# Patient Record
Sex: Female | Born: 1960
Health system: Southern US, Community
[De-identification: ages and names within clinical notes are randomized; demographics above are authoritative.]

## PROBLEM LIST (undated history)

## (undated) DIAGNOSIS — I10 Essential (primary) hypertension: Secondary | ICD-10-CM

## (undated) DIAGNOSIS — M1990 Unspecified osteoarthritis, unspecified site: Secondary | ICD-10-CM

## (undated) DIAGNOSIS — M8580 Other specified disorders of bone density and structure, unspecified site: Secondary | ICD-10-CM

## (undated) DIAGNOSIS — C801 Malignant (primary) neoplasm, unspecified: Secondary | ICD-10-CM

## (undated) DIAGNOSIS — I499 Cardiac arrhythmia, unspecified: Secondary | ICD-10-CM

## (undated) DIAGNOSIS — R011 Cardiac murmur, unspecified: Secondary | ICD-10-CM

## (undated) DIAGNOSIS — Z8489 Family history of other specified conditions: Secondary | ICD-10-CM

## (undated) DIAGNOSIS — Z972 Presence of dental prosthetic device (complete) (partial): Secondary | ICD-10-CM

## (undated) DIAGNOSIS — Z973 Presence of spectacles and contact lenses: Secondary | ICD-10-CM

## (undated) HISTORY — PX: BREAST SURGERY: SHX581

## (undated) HISTORY — PX: OTHER SURGICAL HISTORY: SHX169

## (undated) HISTORY — PX: BREAST BIOPSY: SHX20

## (undated) HISTORY — PX: SHOULDER SURGERY: SHX246

## (undated) HISTORY — PX: ABDOMINAL HYSTERECTOMY: SHX81

## (undated) HISTORY — PX: DIAGNOSTIC LAPAROSCOPY: SUR761

## (undated) HISTORY — PX: COLON SURGERY: SHX602

---

## 1983-05-23 HISTORY — PX: FOOT SURGERY: SHX648

## 1997-09-24 ENCOUNTER — Encounter: Admission: RE | Admit: 1997-09-24 | Discharge: 1997-09-24 | Payer: Self-pay | Admitting: Obstetrics

## 1997-12-18 ENCOUNTER — Emergency Department (HOSPITAL_COMMUNITY): Admission: EM | Admit: 1997-12-18 | Discharge: 1997-12-18 | Payer: Self-pay | Admitting: Emergency Medicine

## 1997-12-28 ENCOUNTER — Encounter: Admission: RE | Admit: 1997-12-28 | Discharge: 1997-12-28 | Payer: Self-pay | Admitting: Obstetrics & Gynecology

## 1998-04-21 ENCOUNTER — Other Ambulatory Visit: Admission: RE | Admit: 1998-04-21 | Discharge: 1998-04-21 | Payer: Self-pay | Admitting: *Deleted

## 1998-04-30 ENCOUNTER — Encounter: Payer: Self-pay | Admitting: *Deleted

## 1998-05-04 ENCOUNTER — Inpatient Hospital Stay (HOSPITAL_COMMUNITY): Admission: RE | Admit: 1998-05-04 | Discharge: 1998-05-06 | Payer: Self-pay | Admitting: *Deleted

## 1998-09-23 ENCOUNTER — Emergency Department (HOSPITAL_COMMUNITY): Admission: EM | Admit: 1998-09-23 | Discharge: 1998-09-23 | Payer: Self-pay | Admitting: Emergency Medicine

## 1999-01-19 ENCOUNTER — Inpatient Hospital Stay (HOSPITAL_COMMUNITY): Admission: AD | Admit: 1999-01-19 | Discharge: 1999-01-19 | Payer: Self-pay | Admitting: Obstetrics & Gynecology

## 1999-01-25 ENCOUNTER — Emergency Department (HOSPITAL_COMMUNITY): Admission: EM | Admit: 1999-01-25 | Discharge: 1999-01-25 | Payer: Self-pay | Admitting: Emergency Medicine

## 1999-07-25 ENCOUNTER — Other Ambulatory Visit: Admission: RE | Admit: 1999-07-25 | Discharge: 1999-07-25 | Payer: Self-pay | Admitting: *Deleted

## 2000-04-09 ENCOUNTER — Encounter: Payer: Self-pay | Admitting: *Deleted

## 2000-04-09 ENCOUNTER — Encounter: Admission: RE | Admit: 2000-04-09 | Discharge: 2000-04-09 | Payer: Self-pay | Admitting: *Deleted

## 2000-07-22 ENCOUNTER — Inpatient Hospital Stay (HOSPITAL_COMMUNITY): Admission: AD | Admit: 2000-07-22 | Discharge: 2000-07-22 | Payer: Self-pay | Admitting: Obstetrics and Gynecology

## 2000-07-22 ENCOUNTER — Encounter: Payer: Self-pay | Admitting: Obstetrics and Gynecology

## 2000-07-23 ENCOUNTER — Ambulatory Visit (HOSPITAL_COMMUNITY): Admission: RE | Admit: 2000-07-23 | Discharge: 2000-07-23 | Payer: Self-pay | Admitting: Surgery

## 2000-07-23 ENCOUNTER — Encounter: Payer: Self-pay | Admitting: Surgery

## 2000-09-11 ENCOUNTER — Other Ambulatory Visit: Admission: RE | Admit: 2000-09-11 | Discharge: 2000-09-11 | Payer: Self-pay | Admitting: *Deleted

## 2000-09-19 ENCOUNTER — Encounter: Admission: RE | Admit: 2000-09-19 | Discharge: 2000-09-19 | Payer: Self-pay | Admitting: *Deleted

## 2000-09-19 ENCOUNTER — Encounter: Payer: Self-pay | Admitting: *Deleted

## 2000-10-01 ENCOUNTER — Ambulatory Visit (HOSPITAL_BASED_OUTPATIENT_CLINIC_OR_DEPARTMENT_OTHER): Admission: RE | Admit: 2000-10-01 | Discharge: 2000-10-01 | Payer: Self-pay | Admitting: General Surgery

## 2000-10-01 ENCOUNTER — Encounter (INDEPENDENT_AMBULATORY_CARE_PROVIDER_SITE_OTHER): Payer: Self-pay | Admitting: Specialist

## 2002-02-26 ENCOUNTER — Emergency Department (HOSPITAL_COMMUNITY): Admission: EM | Admit: 2002-02-26 | Discharge: 2002-02-26 | Payer: Self-pay | Admitting: Emergency Medicine

## 2002-08-13 ENCOUNTER — Emergency Department (HOSPITAL_COMMUNITY): Admission: EM | Admit: 2002-08-13 | Discharge: 2002-08-13 | Payer: Self-pay | Admitting: Emergency Medicine

## 2002-08-19 ENCOUNTER — Encounter: Admission: RE | Admit: 2002-08-19 | Discharge: 2002-08-19 | Payer: Self-pay | Admitting: Internal Medicine

## 2002-09-10 ENCOUNTER — Encounter: Admission: RE | Admit: 2002-09-10 | Discharge: 2002-09-10 | Payer: Self-pay | Admitting: Internal Medicine

## 2002-10-20 ENCOUNTER — Encounter: Admission: RE | Admit: 2002-10-20 | Discharge: 2002-10-20 | Payer: Self-pay | Admitting: Internal Medicine

## 2002-12-29 ENCOUNTER — Encounter: Admission: RE | Admit: 2002-12-29 | Discharge: 2002-12-29 | Payer: Self-pay | Admitting: Infectious Diseases

## 2003-02-02 ENCOUNTER — Encounter: Admission: RE | Admit: 2003-02-02 | Discharge: 2003-02-02 | Payer: Self-pay | Admitting: Internal Medicine

## 2003-03-20 ENCOUNTER — Encounter: Admission: RE | Admit: 2003-03-20 | Discharge: 2003-03-20 | Payer: Self-pay | Admitting: Internal Medicine

## 2003-08-11 ENCOUNTER — Emergency Department (HOSPITAL_COMMUNITY): Admission: EM | Admit: 2003-08-11 | Discharge: 2003-08-11 | Payer: Self-pay | Admitting: Emergency Medicine

## 2003-10-05 ENCOUNTER — Emergency Department (HOSPITAL_COMMUNITY): Admission: EM | Admit: 2003-10-05 | Discharge: 2003-10-05 | Payer: Self-pay | Admitting: Emergency Medicine

## 2003-12-02 ENCOUNTER — Encounter: Admission: RE | Admit: 2003-12-02 | Discharge: 2003-12-02 | Payer: Self-pay | Admitting: Internal Medicine

## 2003-12-02 ENCOUNTER — Ambulatory Visit (HOSPITAL_COMMUNITY): Admission: RE | Admit: 2003-12-02 | Discharge: 2003-12-02 | Payer: Self-pay | Admitting: Internal Medicine

## 2003-12-04 ENCOUNTER — Encounter: Admission: RE | Admit: 2003-12-04 | Discharge: 2003-12-04 | Payer: Self-pay | Admitting: Internal Medicine

## 2003-12-24 ENCOUNTER — Ambulatory Visit (HOSPITAL_COMMUNITY): Admission: RE | Admit: 2003-12-24 | Discharge: 2003-12-24 | Payer: Self-pay | Admitting: Internal Medicine

## 2003-12-24 ENCOUNTER — Encounter (INDEPENDENT_AMBULATORY_CARE_PROVIDER_SITE_OTHER): Payer: Self-pay | Admitting: *Deleted

## 2004-01-31 ENCOUNTER — Emergency Department (HOSPITAL_COMMUNITY): Admission: EM | Admit: 2004-01-31 | Discharge: 2004-02-01 | Payer: Self-pay | Admitting: Emergency Medicine

## 2004-09-22 ENCOUNTER — Ambulatory Visit: Payer: Self-pay | Admitting: Internal Medicine

## 2004-12-12 ENCOUNTER — Emergency Department (HOSPITAL_COMMUNITY): Admission: EM | Admit: 2004-12-12 | Discharge: 2004-12-12 | Payer: Self-pay | Admitting: Emergency Medicine

## 2005-02-22 ENCOUNTER — Emergency Department (HOSPITAL_COMMUNITY): Admission: EM | Admit: 2005-02-22 | Discharge: 2005-02-22 | Payer: Self-pay | Admitting: Emergency Medicine

## 2005-03-17 ENCOUNTER — Ambulatory Visit: Payer: Self-pay | Admitting: Hospitalist

## 2005-03-31 ENCOUNTER — Ambulatory Visit: Payer: Self-pay | Admitting: Internal Medicine

## 2005-05-17 ENCOUNTER — Emergency Department (HOSPITAL_COMMUNITY): Admission: EM | Admit: 2005-05-17 | Discharge: 2005-05-17 | Payer: Self-pay | Admitting: Emergency Medicine

## 2005-06-22 ENCOUNTER — Emergency Department (HOSPITAL_COMMUNITY): Admission: EM | Admit: 2005-06-22 | Discharge: 2005-06-22 | Payer: Self-pay | Admitting: Emergency Medicine

## 2005-11-30 ENCOUNTER — Ambulatory Visit: Payer: Self-pay | Admitting: Hospitalist

## 2006-01-26 ENCOUNTER — Ambulatory Visit: Payer: Self-pay | Admitting: Hospitalist

## 2006-01-30 ENCOUNTER — Ambulatory Visit: Payer: Self-pay | Admitting: Internal Medicine

## 2006-02-13 ENCOUNTER — Ambulatory Visit: Payer: Self-pay | Admitting: Internal Medicine

## 2006-03-05 ENCOUNTER — Encounter (INDEPENDENT_AMBULATORY_CARE_PROVIDER_SITE_OTHER): Payer: Self-pay | Admitting: Internal Medicine

## 2006-03-05 ENCOUNTER — Ambulatory Visit: Payer: Self-pay | Admitting: Internal Medicine

## 2006-03-05 LAB — CONVERTED CEMR LAB
BUN: 15 mg/dL (ref 6–23)
CO2: 29 meq/L (ref 19–32)
Glucose, Bld: 123 mg/dL — ABNORMAL HIGH (ref 70–99)
Hemoglobin: 15 g/dL (ref 12.0–15.0)
Leukocyte count, blood: 8.2 10*9/L (ref 4.0–10.5)
MCHC: 32.8 g/dL (ref 30.0–36.0)
Platelets: 394 10*3/uL (ref 150–400)
Potassium: 3.8 meq/L (ref 3.5–5.3)
Sodium: 137 meq/L (ref 135–145)
TSH: 2.066 microintl units/mL (ref 0.350–5.50)

## 2006-03-15 ENCOUNTER — Ambulatory Visit: Payer: Self-pay | Admitting: Cardiology

## 2006-03-19 ENCOUNTER — Ambulatory Visit: Payer: Self-pay | Admitting: Internal Medicine

## 2006-03-19 ENCOUNTER — Encounter (INDEPENDENT_AMBULATORY_CARE_PROVIDER_SITE_OTHER): Payer: Self-pay | Admitting: Internal Medicine

## 2006-03-19 LAB — CONVERTED CEMR LAB
BUN: 13 mg/dL (ref 6–23)
CO2: 26 meq/L (ref 19–32)
Creatinine, Ser: 0.65 mg/dL (ref 0.40–1.20)
Protein, ur: NEGATIVE mg/dL
Sodium: 139 meq/L (ref 135–145)
Urobilinogen, UA: 1 (ref 0.0–1.0)

## 2006-03-21 ENCOUNTER — Ambulatory Visit: Payer: Self-pay

## 2006-03-21 ENCOUNTER — Ambulatory Visit: Payer: Self-pay | Admitting: Cardiology

## 2006-04-27 DIAGNOSIS — R002 Palpitations: Secondary | ICD-10-CM | POA: Insufficient documentation

## 2006-04-27 DIAGNOSIS — F17200 Nicotine dependence, unspecified, uncomplicated: Secondary | ICD-10-CM | POA: Insufficient documentation

## 2006-04-27 DIAGNOSIS — K649 Unspecified hemorrhoids: Secondary | ICD-10-CM | POA: Insufficient documentation

## 2006-04-27 DIAGNOSIS — Z872 Personal history of diseases of the skin and subcutaneous tissue: Secondary | ICD-10-CM | POA: Insufficient documentation

## 2006-04-27 DIAGNOSIS — K603 Anal fistula: Secondary | ICD-10-CM | POA: Insufficient documentation

## 2006-04-27 DIAGNOSIS — I10 Essential (primary) hypertension: Secondary | ICD-10-CM | POA: Insufficient documentation

## 2006-05-30 ENCOUNTER — Ambulatory Visit: Payer: Self-pay | Admitting: Internal Medicine

## 2006-05-30 ENCOUNTER — Encounter (INDEPENDENT_AMBULATORY_CARE_PROVIDER_SITE_OTHER): Payer: Self-pay | Admitting: Internal Medicine

## 2006-05-31 ENCOUNTER — Encounter (INDEPENDENT_AMBULATORY_CARE_PROVIDER_SITE_OTHER): Payer: Self-pay | Admitting: Internal Medicine

## 2006-05-31 LAB — CONVERTED CEMR LAB
Chlamydia, DNA Probe: NEGATIVE
GC Probe Amp, Genital: NEGATIVE
Hemoglobin, Urine: NEGATIVE
Ketones, ur: NEGATIVE mg/dL
Nitrite: NEGATIVE
Urobilinogen, UA: 0.2 (ref 0.0–1.0)

## 2006-06-05 ENCOUNTER — Telehealth (INDEPENDENT_AMBULATORY_CARE_PROVIDER_SITE_OTHER): Payer: Self-pay | Admitting: *Deleted

## 2006-06-05 ENCOUNTER — Inpatient Hospital Stay (HOSPITAL_COMMUNITY): Admission: AD | Admit: 2006-06-05 | Discharge: 2006-06-05 | Payer: Self-pay | Admitting: Obstetrics and Gynecology

## 2006-06-08 ENCOUNTER — Ambulatory Visit: Payer: Self-pay | Admitting: Hospitalist

## 2006-06-08 ENCOUNTER — Encounter (INDEPENDENT_AMBULATORY_CARE_PROVIDER_SITE_OTHER): Payer: Self-pay | Admitting: Internal Medicine

## 2006-06-08 DIAGNOSIS — R109 Unspecified abdominal pain: Secondary | ICD-10-CM | POA: Insufficient documentation

## 2006-06-08 LAB — CONVERTED CEMR LAB
ALT: 18 units/L (ref 0–35)
AST: 16 units/L (ref 0–37)
Albumin: 4 g/dL (ref 3.5–5.2)
Alkaline Phosphatase: 53 units/L (ref 39–117)
BUN: 16 mg/dL (ref 6–23)
CO2: 26 meq/L (ref 19–32)
Calcium: 10 mg/dL (ref 8.4–10.5)
Chloride: 102 meq/L (ref 96–112)
Creatinine, Ser: 0.74 mg/dL (ref 0.40–1.20)
Glucose, Bld: 112 mg/dL — ABNORMAL HIGH (ref 70–99)
HCT: 40.9 % (ref 36.0–46.0)
Hemoglobin: 13.8 g/dL (ref 12.0–15.0)
MCHC: 33.7 g/dL (ref 30.0–36.0)
MCV: 84.7 fL (ref 78.0–100.0)
Platelets: 366 10*3/uL (ref 150–400)
Potassium: 3.5 meq/L (ref 3.5–5.3)
RBC: 4.83 M/uL (ref 3.87–5.11)
RDW: 13.7 % (ref 11.5–14.0)
Sed Rate: 5 mm/hr (ref 0–22)
Sodium: 139 meq/L (ref 135–145)
Total Bilirubin: 0.2 mg/dL — ABNORMAL LOW (ref 0.3–1.2)
Total Protein: 6.2 g/dL (ref 6.0–8.3)
WBC: 9.1 10*3/uL (ref 4.0–10.5)

## 2006-06-12 ENCOUNTER — Ambulatory Visit: Payer: Self-pay | Admitting: Internal Medicine

## 2006-07-12 ENCOUNTER — Telehealth: Payer: Self-pay | Admitting: *Deleted

## 2006-08-28 ENCOUNTER — Ambulatory Visit: Payer: Self-pay | Admitting: Internal Medicine

## 2006-08-29 ENCOUNTER — Encounter (INDEPENDENT_AMBULATORY_CARE_PROVIDER_SITE_OTHER): Payer: Self-pay | Admitting: Internal Medicine

## 2006-08-29 LAB — CONVERTED CEMR LAB
Bilirubin Urine: NEGATIVE
Hemoglobin, Urine: NEGATIVE
Ketones, ur: NEGATIVE mg/dL
Leukocytes, UA: NEGATIVE
Nitrite: NEGATIVE
Urobilinogen, UA: 0.2 (ref 0.0–1.0)

## 2006-08-31 ENCOUNTER — Ambulatory Visit (HOSPITAL_COMMUNITY): Admission: RE | Admit: 2006-08-31 | Discharge: 2006-08-31 | Payer: Self-pay | Admitting: Internal Medicine

## 2006-09-12 ENCOUNTER — Encounter (INDEPENDENT_AMBULATORY_CARE_PROVIDER_SITE_OTHER): Payer: Self-pay | Admitting: Internal Medicine

## 2006-10-05 ENCOUNTER — Ambulatory Visit: Payer: Self-pay | Admitting: Hospitalist

## 2006-10-05 ENCOUNTER — Encounter (INDEPENDENT_AMBULATORY_CARE_PROVIDER_SITE_OTHER): Payer: Self-pay | Admitting: Internal Medicine

## 2006-10-05 DIAGNOSIS — N898 Other specified noninflammatory disorders of vagina: Secondary | ICD-10-CM | POA: Insufficient documentation

## 2006-10-08 LAB — CONVERTED CEMR LAB: Candida species: NEGATIVE

## 2007-01-15 ENCOUNTER — Encounter (INDEPENDENT_AMBULATORY_CARE_PROVIDER_SITE_OTHER): Payer: Self-pay | Admitting: Internal Medicine

## 2007-01-15 ENCOUNTER — Ambulatory Visit: Payer: Self-pay | Admitting: Internal Medicine

## 2007-01-15 LAB — CONVERTED CEMR LAB
Bilirubin Urine: NEGATIVE
Chlamydia, DNA Probe: NEGATIVE
GC Probe Amp, Genital: NEGATIVE
Gardnerella vaginalis: NEGATIVE
Glucose, Urine, Semiquant: NEGATIVE
Ketones, ur: NEGATIVE mg/dL
Specific Gravity, Urine: >=1.03
Specific Gravity, Urine: 1.031 — ABNORMAL HIGH (ref 1.005–1.03)
Urine Glucose: NEGATIVE mg/dL
Urobilinogen, UA: 0.2
Urobilinogen, UA: 0.2 (ref 0.0–1.0)
pH: 6 (ref 5.0–8.0)

## 2007-01-18 ENCOUNTER — Telehealth: Payer: Self-pay | Admitting: *Deleted

## 2007-01-18 ENCOUNTER — Encounter (INDEPENDENT_AMBULATORY_CARE_PROVIDER_SITE_OTHER): Payer: Self-pay | Admitting: Internal Medicine

## 2007-01-28 ENCOUNTER — Telehealth: Payer: Self-pay | Admitting: *Deleted

## 2007-01-29 ENCOUNTER — Telehealth: Payer: Self-pay | Admitting: *Deleted

## 2007-02-06 ENCOUNTER — Telehealth: Payer: Self-pay | Admitting: *Deleted

## 2007-03-26 ENCOUNTER — Ambulatory Visit (HOSPITAL_COMMUNITY): Admission: RE | Admit: 2007-03-26 | Discharge: 2007-03-26 | Payer: Self-pay | Admitting: *Deleted

## 2007-03-26 ENCOUNTER — Ambulatory Visit: Payer: Self-pay | Admitting: *Deleted

## 2007-03-26 DIAGNOSIS — R229 Localized swelling, mass and lump, unspecified: Secondary | ICD-10-CM | POA: Insufficient documentation

## 2007-04-08 ENCOUNTER — Emergency Department (HOSPITAL_COMMUNITY): Admission: EM | Admit: 2007-04-08 | Discharge: 2007-04-08 | Payer: Self-pay | Admitting: Emergency Medicine

## 2007-05-02 ENCOUNTER — Telehealth (INDEPENDENT_AMBULATORY_CARE_PROVIDER_SITE_OTHER): Payer: Self-pay | Admitting: Internal Medicine

## 2008-08-10 ENCOUNTER — Encounter (INDEPENDENT_AMBULATORY_CARE_PROVIDER_SITE_OTHER): Payer: Self-pay | Admitting: Internal Medicine

## 2010-01-17 ENCOUNTER — Emergency Department (HOSPITAL_COMMUNITY): Admission: EM | Admit: 2010-01-17 | Discharge: 2010-01-17 | Payer: Self-pay | Admitting: Emergency Medicine

## 2010-04-08 ENCOUNTER — Emergency Department (HOSPITAL_COMMUNITY): Admission: EM | Admit: 2010-04-08 | Discharge: 2010-04-08 | Payer: Self-pay | Admitting: Emergency Medicine

## 2010-06-20 ENCOUNTER — Emergency Department (HOSPITAL_COMMUNITY)
Admission: EM | Admit: 2010-06-20 | Discharge: 2010-06-20 | Payer: Self-pay | Source: Home / Self Care | Admitting: Family Medicine

## 2010-06-20 LAB — WET PREP, GENITAL
Clue Cells Wet Prep HPF POC: NONE SEEN
Trich, Wet Prep: NONE SEEN
Yeast Wet Prep HPF POC: NONE SEEN

## 2010-06-21 NOTE — Consult Note (Signed)
Summary: Colonoscopy-Dr. Evette Cristal  Colonoscopy-Dr. Evette Cristal   Imported By: Dorice Lamas 10/03/2006 09:49:07  _____________________________________________________________________  External Attachment:    Type:   Image     Comment:   External Document

## 2010-08-02 LAB — URINALYSIS, ROUTINE W REFLEX MICROSCOPIC
Bilirubin Urine: NEGATIVE
Hgb urine dipstick: NEGATIVE
Nitrite: NEGATIVE
Urobilinogen, UA: 1 mg/dL (ref 0.0–1.0)
pH: 7 (ref 5.0–8.0)

## 2010-08-02 LAB — WET PREP, GENITAL: Trich, Wet Prep: NONE SEEN

## 2010-08-02 LAB — GC/CHLAMYDIA PROBE AMP, GENITAL
Chlamydia, DNA Probe: NEGATIVE
GC Probe Amp, Genital: NEGATIVE

## 2010-08-02 LAB — POCT I-STAT, CHEM 8
Calcium, Ion: 1.16 mmol/L (ref 1.12–1.32)
Glucose, Bld: 98 mg/dL (ref 70–99)
Hemoglobin: 15.6 g/dL — ABNORMAL HIGH (ref 12.0–15.0)
Potassium: 4 mEq/L (ref 3.5–5.1)

## 2010-08-04 LAB — BASIC METABOLIC PANEL
CO2: 30 mEq/L (ref 19–32)
Calcium: 9.4 mg/dL (ref 8.4–10.5)
Creatinine, Ser: 0.7 mg/dL (ref 0.4–1.2)
GFR calc non Af Amer: >60 mL/min (ref >60)

## 2010-10-07 NOTE — Op Note (Signed)
[facility]  Patient:    Hannah [name], Hannah [name]                       MRN: [id number] Proc. Date: 10/01/00 Adm. Date:  [id number] Attending:  Janalyn Rouse                           Operative Report  PREOPERATIVE DIAGNOSIS:  Fibroadenoma of the right breast.  POSTOPERATIVE DIAGNOSIS:  Cyst of the right breast.  PROCEDURE:  Excision of right breast mass.  SURGEON:  Rose Phi. Maple Hudson, M.D.  ANESTHESIA:  MAC.  DESCRIPTION OF PROCEDURE:  Patient placed on the operating table and the right breast prepped and draped in the usual fashion.  A curvilinear incision was outlined over the palpable mass at the 10 oclock position.  The area was infiltrated with 1% Xylocaine with adrenalin.  Incision was made and exposed the mass and started to excise it and cut into an obvious cyst.  Then totally excised it.  Hemostasis obtained with the cautery.  The deeper breast tissue approximated with 3-0 Vicryl and the skin with a subcuticular 4-0 Monocryl and Steri-Strips.  Dressing applied.  Patient transferred to the recovery room in satisfactory condition, having tolerated the procedure well. DD:  10/01/00 TD:  10/02/00 Job: [id number]

## 2010-10-07 NOTE — Assessment & Plan Note (Signed)
[facility]                              CARDIOLOGY OFFICE NOTE   Hannah, [name]                       MRN:          [id number]  DATE:03/15/2006                            DOB:          Jan 05, 1961    REASON FOR PRESENTATION:  Evaluate patient with palpitations.   HISTORY OF PRESENT ILLNESS:  The patient is a 50 year old African American  female without prior cardiac history.  She has had palpitations for years.  She has also had hypertension.  She has been on Atenolol in the past.  She  does not think this helped the palpitations.  She has been on Diltiazem more  recently.  Again, she was getting palpitations through this.  Most recently,  to control her blood pressure, she has been placed on Lisinopril and is also  on hydrochlorothiazide.  She has had an echocardiogram which demonstrated no  significant abnormalities.  In particular, it did not demonstrate mitral  valve prolapse which had been demonstrated apparently previously on physical  exam.  There were no valvular abnormalities.  She had normal left  ventricular function.   The patient notices the palpitations every few days.  They happen  sporadically.  She does not notice them at night when she is asleep,  however, she has them during the day.  She cannot bring them on.  She thinks  maybe they are increased with emotional stress.  She feels her heart  fluttering.  She feels tired.  It may last for five minutes at a time.  She does not have any syncope and has not actually been pre-syncopal.  However, these are significantly symptomatic to her.  Other complaints  include some easy fatigability.  Some mildly increased dyspnea with  exertion, however, she remains active taking care of grandchildren and  working.  She does not have any classic chest pressure or neck discomfort.  She has no arm discomfort.  She has no PND or orthopnea.  She does drink a  little caffeine, a couple of cokes  a day and also eats some chocolate.  She  recently had some blood work drawn, though I do not have these results.   PAST MEDICAL HISTORY:  Hypertension, hemorrhoids.   PAST SURGICAL HISTORY:  Hysterectomy, breast cyst resected, rectal fistula  repaired.   ALLERGIES:  None.   MEDICATIONS:  1. Hydrochlorothiazide 25 mg daily.  2. Lisinopril 20 mg daily.   SOCIAL HISTORY:  The patient is divorced.  She has one child and four  grandchildren.  She smokes 1/2 pack a day and has done so for 35 years.   FAMILY HISTORY:  Noncontributory for early coronary disease.  She thinks her  father might have had a heart attack in his 105s.  She thinks her mother had  a TIA in her 68s.   REVIEW OF SYMPTOMS:  As stated in the HPI and negative for other systems.   PHYSICAL EXAMINATION:  GENERAL:  The patient is in no distress.  VITAL SIGNS:  Blood pressure 152/88, heart rate 65 and regular.  HEENT:  Eyelids unremarkable.  Pupils  equal, round, and reactive to light.  Fundi within normal limits.  Oral mucosa unremarkable.  NECK:  No jugular venous distention, wave form within normal limits, carotid  upstroke brisk and symmetric, no bruits, no thyromegaly.  LYMPHATICS:  No cervical, axillary, or inguinal adenopathy.  LUNGS:  Clear to auscultation bilaterally.  BACK:  No costovertebral angle tenderness.  CHEST:  Unremarkable.  HEART:  PMI non displaced or sustained.  S1 and S2 within normal limits.  No  S3, S4, or murmurs.  ABDOMEN:  Flat, positive bowel sounds, normal in frequency and pitch, no  rebound, no guarding, no midline pulsatile mass, no hepatomegaly, no  splenomegaly.  SKIN:  No rashes, no nodules.  EXTREMITIES:  2+ pulses throughout, no edema, no cyanosis, no clubbing.  NEUROLOGICAL:  Oriented to person, place and time.  Cranial nerves 2-12  grossly intact.  Motor grossly intact.   EKG sinus rhythm, rate 65, axis within normal limits, intervals within  normal limits, inferolateral  T-wave inversions consistent with ischemia, no  old EKGs for comparison.   ASSESSMENT/PLAN:  1. Palpitations.  The patient's palpitations may be an SVT versus ectopic      beats.  I cannot tell from the description.  I am going to place a two      week event monitor on her.  She is going to call and get the results of      her recent TSH and labs drawn.  She is going to try to cut out all      caffeine to see if this improves things.  We will then treat this based      on the findings above.  2. Abnormal EKG.  The patient does have significant cardiovascular risk      factors.  She does have some dyspnea with exertion and slightly      decreased exercise tolerance.  Given this and the abnormal EKG, the pre-      test probability of obstructive coronary disease is  moderate.  She      will get an Adenosine Cardiolite.  3. Follow up.  I will see her back in about six weeks or sooner if she has      increased problems.    ______________________________  Rollene Rotunda, MD, [facility]    JH/MedQ  DD: 03/15/2006  DT: 03/16/2006  Job #: [id number]   cc:   Ellie Lunch, M.D.

## 2010-11-04 ENCOUNTER — Emergency Department (HOSPITAL_COMMUNITY)
Admission: EM | Admit: 2010-11-04 | Discharge: 2010-11-04 | Disposition: A | Discharge status: Home / Self Care | Payer: Self-pay | Attending: Emergency Medicine | Admitting: Emergency Medicine

## 2010-11-04 DIAGNOSIS — K089 Disorder of teeth and supporting structures, unspecified: Secondary | ICD-10-CM | POA: Insufficient documentation

## 2011-02-28 LAB — CBC
HCT: 38.1
Hemoglobin: 12.4
MCHC: 32.6
Platelets: 356
RBC: 4.43
WBC: 10.3

## 2011-02-28 LAB — I-STAT 8, (EC8 V) (CONVERTED LAB)
Acid-Base Excess: 1
Bicarbonate: 27 — ABNORMAL HIGH
Chloride: 103
HCT: 42
Hemoglobin: 14.3
Operator id: 151321
TCO2: 28
pCO2, Ven: 46.9

## 2011-02-28 LAB — POCT I-STAT CREATININE
Creatinine, Ser: 0.9
Operator id: 151321

## 2011-02-28 LAB — URINALYSIS, ROUTINE W REFLEX MICROSCOPIC
Glucose, UA: NEGATIVE
Hgb urine dipstick: NEGATIVE
Nitrite: NEGATIVE
Urobilinogen, UA: 1
pH: 5.5

## 2011-02-28 LAB — DIFFERENTIAL
Basophils Absolute: 0.1
Eosinophils Absolute: 1 — ABNORMAL HIGH
Lymphocytes Relative: 31
Monocytes Relative: 6

## 2011-02-28 LAB — WET PREP, GENITAL: Clue Cells Wet Prep HPF POC: NONE SEEN

## 2011-10-15 ENCOUNTER — Encounter (HOSPITAL_COMMUNITY): Payer: Self-pay

## 2011-10-15 ENCOUNTER — Emergency Department (HOSPITAL_COMMUNITY)
Admission: EM | Admit: 2011-10-15 | Discharge: 2011-10-15 | Disposition: A | Discharge status: Home Health | Payer: Self-pay | Source: Home / Self Care | Attending: Emergency Medicine | Admitting: Emergency Medicine

## 2011-10-15 DIAGNOSIS — N760 Acute vaginitis: Secondary | ICD-10-CM

## 2011-10-15 DIAGNOSIS — R81 Glycosuria: Secondary | ICD-10-CM

## 2011-10-15 DIAGNOSIS — E119 Type 2 diabetes mellitus without complications: Secondary | ICD-10-CM

## 2011-10-15 HISTORY — DX: Essential (primary) hypertension: I10

## 2011-10-15 LAB — POCT I-STAT, CHEM 8
BUN: 14 mg/dL (ref 6–23)
Creatinine, Ser: 0.8 mg/dL (ref 0.50–1.10)
Glucose, Bld: 367 mg/dL — ABNORMAL HIGH (ref 70–99)
Potassium: 3.1 mEq/L — ABNORMAL LOW (ref 3.5–5.1)
Sodium: 138 mEq/L (ref 135–145)

## 2011-10-15 LAB — WET PREP, GENITAL: Yeast Wet Prep HPF POC: NONE SEEN

## 2011-10-15 LAB — GLUCOSE, CAPILLARY: Glucose-Capillary: 358 mg/dL — ABNORMAL HIGH (ref 70–99)

## 2011-10-15 LAB — POCT URINALYSIS DIP (DEVICE)
Ketones, ur: NEGATIVE mg/dL
Leukocytes, UA: NEGATIVE
Specific Gravity, Urine: >=1.03 (ref 1.005–1.030)
pH: 5.5 (ref 5.0–8.0)

## 2011-10-15 MED ORDER — FLUCONAZOLE 200 MG PO TABS: 100.0000 mg | ORAL_TABLET | Freq: Once | ORAL | Status: AC

## 2011-10-15 MED ORDER — METFORMIN HCL 500 MG PO TABS: 500.0000 mg | ORAL_TABLET | Freq: Two times a day (BID) | ORAL | Status: DC

## 2011-10-15 MED ORDER — METRONIDAZOLE 500 MG PO TABS: 500.0000 mg | ORAL_TABLET | Freq: Two times a day (BID) | ORAL | Status: AC

## 2011-10-15 NOTE — Discharge Instructions (Signed)
As discussed she will need to establish with a primary care doctor in the meantime we have had a significant discussion about the initial measures that you need to take against diabetes. Long-term goals will be establish her primary care doctor and advise you to start with this medicine as well as possible. Start with 500 mg daily in about a week increase to 2 tablets per day we also discussed some of the most common side effects of people expressed initially that should not detergent from continuing with the medicine. We have obtained some baseline labs will be useful on her subsequent visit with a new primary care. In the interim flaccid to read about diabetes and to familiarize yourself with diabetes diet. We will contact you if abnormal test results will require further treatment or intervention

## 2011-10-15 NOTE — ED Notes (Signed)
Pt c/o vaginal irritation and discharge.  Pt states she has HX of same.  Pt also states she has increased frequency of urination, denies dysuria, hematuria.

## 2011-10-15 NOTE — ED Provider Notes (Signed)
History     CSN: [id number]  Arrival date & time 10/15/11  1133   First MD Initiated Contact with Patient 10/15/11 1147      Chief Complaint  Patient presents with  . Vaginal Discharge    (Consider location/radiation/quality/duration/timing/severity/associated sxs/prior treatment) HPI Comments: Patient describes intermittently for the last few weeks she expresses irritation in her vaginal area with discrete discharges that come and go. Have had other instances of similar symptoms and has been diagnosed with bacterial vaginosis in many instances. Patient denies any concerns of having been exposed to a sexually transmitted illness. Patient also describes she's been feeling some more tired and fatigued although she is working very hard for long hours she has always entertain the possibility that she has diabetes as she has a strong family history , she urinates frequently.  Patient is a 51 y.o. female presenting with vaginal discharge. The history is provided by the patient.  Vaginal Discharge This is a new problem. The current episode started more than 1 week ago. The problem occurs constantly. The problem has not changed since onset.Pertinent negatives include no abdominal pain, no headaches and no shortness of breath.    Past Medical History  Diagnosis Date  . Hypertension     Past Surgical History  Procedure Date  . Abdominal hysterectomy   . Rectal fissure   . Foot surgery     No family history on file.  History  Substance Use Topics  . Smoking status: Never Smoker   . Smokeless tobacco: Not on file  . Alcohol Use: No    OB History    Grav Para Term Preterm Abortions TAB SAB Ect Mult Living                  Review of Systems  Constitutional: Positive for activity change, appetite change and fatigue. Negative for fever, chills and diaphoresis.  Respiratory: Negative for shortness of breath.   Gastrointestinal: Negative for nausea, vomiting and abdominal pain.   Genitourinary: Positive for frequency and vaginal discharge. Negative for dysuria, flank pain, difficulty urinating, vaginal pain and dyspareunia.  Skin: Negative for rash.  Neurological: Negative for dizziness, light-headedness and headaches.    Allergies  Hydrochlorothiazide and Latex  Home Medications   Current Outpatient Rx  Name Route Sig Dispense Refill  . LISINOPRIL 10 MG PO TABS Oral Take 10 mg by mouth daily.    Marland Kitchen FLUCONAZOLE 200 MG PO TABS Oral Take 0.5 tablets (100 mg total) by mouth once. 1 tablet 0  . METFORMIN HCL 500 MG PO TABS Oral Take 1 tablet (500 mg total) by mouth 2 (two) times daily with a meal. Start with 500 mg daily for 1 week then 2 tablets a day 60 tablet 3  . METRONIDAZOLE 500 MG PO TABS Oral Take 1 tablet (500 mg total) by mouth 2 (two) times daily. 14 tablet 0    BP 133/86  Pulse 79  Temp(Src) 97.5 F (36.4 C) (Oral)  Resp 18  SpO2 99%  Physical Exam  Nursing note and vitals reviewed. Constitutional: She appears well-developed and well-nourished.  Abdominal: Soft. There is no tenderness.  Genitourinary: There is no rash on the right labia. There is no rash on the left labia. No erythema, tenderness or bleeding around the vagina. Vaginal discharge found.  Musculoskeletal: Normal range of motion.  Neurological: She is alert.  Skin: Skin is warm. No rash noted. No erythema.    ED Course  Procedures (including critical care time)  Labs Reviewed  POCT URINALYSIS DIP (DEVICE) - Abnormal; Notable for the following:    Glucose, UA 500 (*)    Bilirubin Urine SMALL (*)    Hgb urine dipstick TRACE (*)    Protein, ur 100 (*)    All other components within normal limits  WET PREP, GENITAL - Abnormal; Notable for the following:    Clue Cells Wet Prep HPF POC MODERATE (*)    WBC, Wet Prep HPF POC MODERATE (*)    All other components within normal limits  GLUCOSE, CAPILLARY - Abnormal; Notable for the following:    Glucose-Capillary 358 (*)    All  other components within normal limits  POCT I-STAT, CHEM 8 - Abnormal; Notable for the following:    Potassium 3.1 (*)    Glucose, Bld 367 (*)    Hemoglobin 16.0 (*)    HCT 47.0 (*)    All other components within normal limits  POCT PREGNANCY, URINE  GC/CHLAMYDIA PROBE AMP, GENITAL  HEMOGLOBIN A1C   No results found.   1. Vaginitis and vulvovaginitis   2. Diabetes mellitus   3. Glucosuria       MDM  Patient presents urgent care with main concern of vaginal symptoms but also states she's been expressing other symptoms as fatigue and increased urination. Today she has been diagnosed with diabetes although historically she has been told in the past that she is borderline diabetic. Patient was started on metformin and was instructed to followup with primary care Dr. baseline labs and hemoglobin A1c was obtained today. Patient was given diabetes education material and specific diabetes diet and we discuss other last modifications. Patient with treatment plan and initiating treatment and agreed and will followup with primary care Dr.        Jimmie Molly, MD 10/15/11 1343

## 2011-10-16 LAB — GC/CHLAMYDIA PROBE AMP, GENITAL: GC Probe Amp, Genital: NEGATIVE

## 2011-10-16 NOTE — ED Notes (Signed)
A1C = 11.1.  Dr. Ladon Applebaum made aware.

## 2011-10-16 NOTE — ED Notes (Addendum)
Wet prep shows many clue cells and wbc's.  Pt adequately treated at visit with flagyl and diflucan.

## 2011-10-25 ENCOUNTER — Emergency Department (HOSPITAL_COMMUNITY)
Admission: EM | Admit: 2011-10-25 | Discharge: 2011-10-25 | Disposition: A | Discharge status: Home / Self Care | Payer: Self-pay | Attending: Emergency Medicine | Admitting: Emergency Medicine

## 2011-10-25 ENCOUNTER — Encounter (HOSPITAL_COMMUNITY): Payer: Self-pay | Admitting: *Deleted

## 2011-10-25 ENCOUNTER — Emergency Department (HOSPITAL_COMMUNITY): Payer: Self-pay

## 2011-10-25 DIAGNOSIS — M79609 Pain in unspecified limb: Secondary | ICD-10-CM | POA: Insufficient documentation

## 2011-10-25 DIAGNOSIS — E119 Type 2 diabetes mellitus without complications: Secondary | ICD-10-CM | POA: Insufficient documentation

## 2011-10-25 DIAGNOSIS — R209 Unspecified disturbances of skin sensation: Secondary | ICD-10-CM | POA: Insufficient documentation

## 2011-10-25 DIAGNOSIS — M79645 Pain in left finger(s): Secondary | ICD-10-CM

## 2011-10-25 DIAGNOSIS — I10 Essential (primary) hypertension: Secondary | ICD-10-CM | POA: Insufficient documentation

## 2011-10-25 DIAGNOSIS — M25549 Pain in joints of unspecified hand: Secondary | ICD-10-CM | POA: Insufficient documentation

## 2011-10-25 MED ORDER — MELOXICAM 7.5 MG PO TABS: 7.5000 mg | ORAL_TABLET | Freq: Every day | ORAL | Status: DC

## 2011-10-25 NOTE — ED Provider Notes (Signed)
History     CSN: [id number]  Arrival date & time 10/25/11  1614   First MD Initiated Contact with Patient 10/25/11 1856      Chief Complaint  Patient presents with  . Extremity Pain    (Consider location/radiation/quality/duration/timing/severity/associated sxs/prior treatment) HPI Comments: Patient reports she has had pain in her left thumb for over a month.  Pain is located in the interphalangeal joint.  States that over the month she has been able to move the thumb but today while she was opening a jar she felt a pop and a sharp pain radiated up her right arm, states that the thumb was bent and she felt she had to force it back to an extended position.  States since then she hasn't been able to move it.  States she also has mild numbness in the finger, radiating into her radial wrist.  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  Denies any fevers, chills, rashes.  States she has other joint pain, in her bilateral knees and in her feet - states this is more of a stiffness that is worse after resting and improves with activity.  Pt does not know of any family hx of arthritis.  Pt has recently been diagnosed with diabetes at urgent care and has her first PCP appt on June 17.    Patient is a 51 y.o. female presenting with extremity pain. The history is provided by the patient.  Extremity Pain Associated symptoms include arthralgias and numbness. Pertinent negatives include no chills, fever, joint swelling, rash or weakness.    Past Medical History  Diagnosis Date  . Hypertension   . Diabetes mellitus     Past Surgical History  Procedure Date  . Abdominal hysterectomy   . Rectal fissure   . Foot surgery     No family history on file.  History  Substance Use Topics  . Smoking status: Current Everyday Smoker -- 0.5 packs/day    Types: Cigarettes  . Smokeless tobacco: Not on file  . Alcohol Use: No      OB History    Grav Para Term Preterm Abortions TAB SAB Ect Mult Living                  Review of Systems  Constitutional: Negative for fever and chills.  Musculoskeletal: Positive for arthralgias. Negative for back pain and joint swelling.  Skin: Negative for color change, rash and wound.  Neurological: Positive for numbness. Negative for weakness.    Allergies  Hydrochlorothiazide and Latex  Home Medications   Current Outpatient Rx  Name Route Sig Dispense Refill  . AMLODIPINE BESYLATE 2.5 MG PO TABS Oral Take 2.5 mg by mouth daily.    Marland Kitchen CALCIUM CARBONATE 600 MG PO TABS Oral Take 600 mg by mouth 2 (two) times daily with a meal.    . CHLORDIAZEPOXIDE HCL 25 MG PO CAPS Oral Take 25 mg by mouth 3 (three) times daily as needed.    Marland Kitchen METFORMIN HCL 500 MG PO TABS Oral Take 1 tablet (500 mg total) by mouth 2 (two) times daily with a meal. Start with 500 mg daily for 1 week then 2 tablets a day 60 tablet 3  . ADULT MULTIVITAMIN W/MINERALS CH Oral Take 1 tablet by mouth daily.      BP 128/75  Pulse 93  Temp(Src) 98.6 F (  37 C) (Oral)  Resp 18  SpO2 100%  Physical Exam  Nursing note and vitals reviewed. Constitutional: She is oriented to person, place, and time. She appears well-developed and well-nourished. No distress.  HENT:  Head: Normocephalic and atraumatic.  Neck: Neck supple.  Pulmonary/Chest: Effort normal.  Musculoskeletal:       Hands:      Left thumb interphalangeal joint - pain with passive ROM.  Pt does not bend.  MCP AROM intact.  Sensation reported decreased.  Capillary refill < 2 seconds.  Neurological: She is alert and oriented to person, place, and time.  Skin: She is not diaphoretic.    ED Course  Procedures (including critical care time)  Labs Reviewed - No data to display Dg Finger Thumb Left  10/25/2011  *RADIOLOGY REPORT*  Clinical Data: Pain  LEFT THUMB 2+V  Comparison: None.  Findings: Three views of the left thumb submitted.  No acute  fracture or subluxation.  No periosteal reaction or bony erosion.  IMPRESSION: No acute fracture or subluxation.  Original Report Authenticated By: Natasha Mead, M.D.     1. Thumb pain, left       MDM  Patient with left thumb pain for over one month.  Xray is negative.  No signs of infection.  No hx of trauma.  Pt placed in velcro thumb spica for comfort and d/c home with mobic.  PCP and hand follow up.  Patient verbalizes understanding and agrees with plan.          Dillard Cannon [place], [place] 10/25/11 2213

## 2011-10-25 NOTE — ED Notes (Signed)
Pt upset and declined signing discharge on computer, did sign a paper copy

## 2011-10-25 NOTE — ED Notes (Signed)
Pt here from home. States left thumb injured over month ago, experienced "sharp, shooting" pain today. Limited movement in extremity, pain shoots up left arm into neck.

## 2011-10-25 NOTE — Discharge Instructions (Signed)
Read the information below.  Please follow up with your primary care provider at your appointment on June 17.  You may also call the hand surgeon for a follow up appointment.  You may return to the ER at any time for worsening condition or any new symptoms that concern you.

## 2011-10-25 NOTE — ED Provider Notes (Signed)
Medical screening examination/treatment/procedure(s) were performed by non-physician practitioner and as supervising physician I was immediately available for consultation/collaboration.  Flint Melter, MD 10/25/11 [phone]

## 2011-10-26 LAB — GLUCOSE, CAPILLARY

## 2012-01-08 ENCOUNTER — Encounter (HOSPITAL_COMMUNITY): Payer: Self-pay

## 2012-01-08 ENCOUNTER — Other Ambulatory Visit: Payer: Self-pay

## 2012-01-08 ENCOUNTER — Emergency Department (HOSPITAL_COMMUNITY): Payer: Self-pay

## 2012-01-08 ENCOUNTER — Emergency Department (HOSPITAL_COMMUNITY)
Admission: EM | Admit: 2012-01-08 | Discharge: 2012-01-09 | Disposition: A | Discharge status: Home / Self Care | Payer: Self-pay | Attending: Emergency Medicine | Admitting: Emergency Medicine

## 2012-01-08 DIAGNOSIS — I10 Essential (primary) hypertension: Secondary | ICD-10-CM | POA: Insufficient documentation

## 2012-01-08 DIAGNOSIS — R079 Chest pain, unspecified: Secondary | ICD-10-CM | POA: Insufficient documentation

## 2012-01-08 DIAGNOSIS — E119 Type 2 diabetes mellitus without complications: Secondary | ICD-10-CM | POA: Insufficient documentation

## 2012-01-08 DIAGNOSIS — Z79899 Other long term (current) drug therapy: Secondary | ICD-10-CM | POA: Insufficient documentation

## 2012-01-08 DIAGNOSIS — F17200 Nicotine dependence, unspecified, uncomplicated: Secondary | ICD-10-CM | POA: Insufficient documentation

## 2012-01-08 LAB — CBC WITH DIFFERENTIAL/PLATELET
Basophils Absolute: 0.1 10*3/uL (ref 0.0–0.1)
HCT: 36.6 % (ref 36.0–46.0)
Hemoglobin: 12.3 g/dL (ref 12.0–15.0)
Lymphocytes Relative: 40 % (ref 12–46)
Monocytes Absolute: 0.6 10*3/uL (ref 0.1–1.0)
Neutro Abs: 6 10*3/uL (ref 1.7–7.7)
RDW: 13.6 % (ref 11.5–15.5)
WBC: 11.9 10*3/uL — ABNORMAL HIGH (ref 4.0–10.5)

## 2012-01-08 LAB — BASIC METABOLIC PANEL
CO2: 32 mEq/L (ref 19–32)
Chloride: 100 mEq/L (ref 96–112)
Creatinine, Ser: 0.62 mg/dL (ref 0.50–1.10)

## 2012-01-08 LAB — POCT I-STAT TROPONIN I
Troponin i, poc: 0 ng/mL (ref 0.00–0.08)
Troponin i, poc: 0 ng/mL (ref 0.00–0.08)

## 2012-01-08 LAB — D-DIMER, QUANTITATIVE: D-Dimer, Quant: 0.37 ug/mL-FEU (ref 0.00–0.48)

## 2012-01-08 MED ORDER — MORPHINE SULFATE 4 MG/ML IJ SOLN
4.0000 mg | Freq: Once | INTRAMUSCULAR | Status: AC
  Administered 2012-01-08: 4 mg via INTRAVENOUS
  Filled 2012-01-08: qty 1

## 2012-01-08 MED ORDER — IOHEXOL 350 MG/ML SOLN
80.0000 mL | Freq: Once | INTRAVENOUS | Status: AC | PRN
  Administered 2012-01-08: 80 mL via INTRAVENOUS

## 2012-01-08 MED ORDER — NITROGLYCERIN 0.4 MG SL SUBL
0.4000 mg | SUBLINGUAL_TABLET | SUBLINGUAL | Status: DC | PRN
  Administered 2012-01-08 (×2): 0.4 mg via SUBLINGUAL
  Filled 2012-01-08: qty 25

## 2012-01-08 MED ORDER — HYDROCODONE-ACETAMINOPHEN 5-325 MG PO TABS: 1.0000 | ORAL_TABLET | Freq: Four times a day (QID) | ORAL | Status: AC | PRN

## 2012-01-08 MED ORDER — FENTANYL CITRATE 0.05 MG/ML IJ SOLN: 50.0000 ug | Freq: Once | INTRAMUSCULAR | Status: DC

## 2012-01-08 MED ORDER — METOCLOPRAMIDE HCL 5 MG/ML IJ SOLN: 10.0000 mg | Freq: Once | INTRAMUSCULAR | Status: DC

## 2012-01-08 NOTE — ED Provider Notes (Signed)
History     CSN: [id number]  Arrival date & time 01/08/12  1647   First MD Initiated Contact with Patient 01/08/12 1726      Chief Complaint  Patient presents with  . Chest Pain    (Consider location/radiation/quality/duration/timing/severity/associated sxs/prior treatment) Patient is a 51 y.o. female presenting with chest pain. The history is provided by the patient.  Chest Pain The chest pain began 3 - 5 hours ago (3). Chest pain occurs rarely. The chest pain is unchanged. The pain is associated with breathing (movement). At its most intense, the pain is at 10/10. The pain is currently at 10/10. The quality of the pain is described as sharp and pleuritic. The pain does not radiate. Chest pain is worsened by certain positions and deep breathing (movement). Pertinent negatives for primary symptoms include no fever, no syncope, no shortness of breath, no cough, no wheezing, no palpitations, no abdominal pain, no nausea, no vomiting and no dizziness.  Pertinent negatives for associated symptoms include no lower extremity edema, no near-syncope, no orthopnea, no paroxysmal nocturnal dyspnea and no weakness. She tried aspirin and nitroglycerin (ASA 324 mg and Nitro SL x1 via EMS w/o relief) for the symptoms.  Her past medical history is significant for diabetes and hypertension.     Past Medical History  Diagnosis Date  . Hypertension   . Diabetes mellitus     Past Surgical History  Procedure Date  . Abdominal hysterectomy   . Rectal fissure   . Foot surgery     History reviewed. No pertinent family history.  History  Substance Use Topics  . Smoking status: Current Everyday Smoker -- 0.5 packs/day    Types: Cigarettes  . Smokeless tobacco: Not on file  . Alcohol Use: No    OB History    Grav Para Term Preterm Abortions TAB SAB Ect Mult Living                  Review of Systems  Constitutional: Negative for fever and chills.  HENT: Negative.   Respiratory: Negative  for cough, shortness of breath and wheezing.   Cardiovascular: Positive for chest pain. Negative for palpitations, orthopnea, leg swelling, syncope and near-syncope.  Gastrointestinal: Negative for nausea, vomiting, abdominal pain, diarrhea and constipation.  Genitourinary: Negative.   Musculoskeletal: Negative.   Skin: Negative.   Neurological: Negative.  Negative for dizziness, syncope, weakness and light-headedness.  All other systems reviewed and are negative.    Allergies  Hydrochlorothiazide and Latex  Home Medications   Current Outpatient Rx  Name Route Sig Dispense Refill  . AMLODIPINE BESYLATE 2.5 MG PO TABS Oral Take 5 mg by mouth daily.     Marland Kitchen CALCIUM CARBONATE 600 MG PO TABS Oral Take 600 mg by mouth daily.     Marland Kitchen DOCUSATE SODIUM 100 MG PO CAPS Oral Take 100 mg by mouth daily.    Marland Kitchen LINAGLIPTIN-METFORMIN HCL 2.09-998 MG PO TABS Oral Take 1 tablet by mouth 2 (two) times daily.    Marland Kitchen LISINOPRIL 40 MG PO TABS Oral Take 40 mg by mouth daily.    . ADULT MULTIVITAMIN W/MINERALS CH Oral Take 1 tablet by mouth daily.    Marland Kitchen VITAMIN C 500 MG PO TABS Oral Take 500 mg by mouth daily.    Marland Kitchen HYDROCODONE-ACETAMINOPHEN 5-325 MG PO TABS Oral Take 1 tablet by mouth every 6 (six) hours as needed for pain. 15 tablet 0    BP 101/58  Pulse 78  Temp 98.4 F (36.9  C) (Oral)  Resp 12  SpO2 97%  Physical Exam  Nursing note and vitals reviewed. Constitutional: She is oriented to person, place, and time. She appears well-developed and well-nourished. No distress.  HENT:  Head: Normocephalic and atraumatic.  Eyes: Conjunctivae are normal.  Neck: Neck supple.  Cardiovascular: Normal rate, regular rhythm and intact distal pulses.  Exam reveals no friction rub.   No murmur heard. Pulmonary/Chest: Effort normal and breath sounds normal. She has no wheezes. She has no rales. She exhibits no tenderness.  Abdominal: Soft. She exhibits no distension. There is no tenderness.  Musculoskeletal: Normal  range of motion. She exhibits no edema and no tenderness.  Neurological: She is alert and oriented to person, place, and time.  Skin: Skin is warm and dry.    ED Course  Procedures (including critical care time)  Labs Reviewed  CBC WITH DIFFERENTIAL - Abnormal; Notable for the following:    WBC 11.9 (*)     Lymphs Abs 4.7 (*)     All other components within normal limits  BASIC METABOLIC PANEL  D-DIMER, QUANTITATIVE  POCT I-STAT TROPONIN I  POCT I-STAT TROPONIN I   Dg Chest 2 View  01/08/2012  *RADIOLOGY REPORT*  Clinical Data:   left chest pain  CHEST - 2 VIEW  Comparison: 02/01/2004  Findings: Patchy airspace opacities in the basilar segments of both lower lobes.  No effusion.  Heart size upper limits normal.  No overt interstitial edema.  Regional bones unremarkable. No pneumothorax.  IMPRESSION:  Patchy bibasilar infiltrates or subsegmental atelectasis.   Original Report Authenticated By: Osa Craver, M.D.    Ct Angio Chest Pe W/cm &/or Wo Cm  01/08/2012  *RADIOLOGY REPORT*  Clinical Data: Left-sided chest pain and tightness.  CT ANGIOGRAPHY CHEST  Technique:  Multidetector CT imaging of the chest using the standard protocol during bolus administration of intravenous contrast. Multiplanar reconstructed images including MIPs were obtained and reviewed to evaluate the vascular anatomy.  Contrast: 80mL OMNIPAQUE IOHEXOL 350 MG/ML SOLN  Comparison: Chest radiograph 01/08/2012.  Findings: Technically adequate study with good opacification of the central and segmental pulmonary arteries.  No focal filling defects are demonstrated.  No evidence of significant pulmonary embolus.  Normal heart size.  Normal caliber thoracic aorta.  No significant lymphadenopathy in the chest.  Esophagus is mostly decompressed. Visualized portions of the upper abdominal organs are grossly unremarkable.  No pleural effusions.  There is infiltration or atelectasis in both lung bases.  Right midlung  pneumonitis seal.  No pneumothorax.  Airways appear patent. Normal alignment of the thoracic vertebra.  Sternum appears intact.  IMPRESSION: No evidence of significant pulmonary embolus.  Infiltration or atelectasis in both lung bases.   Original Report Authenticated By: Marlon Pel, M.D.      1. Chest pain       MDM  51 yo female with PMHx of DM and HTN who presents with 2 hour history of sharp, pleuritic non-radiating right sided chest pain.  Pt denies fever, cough, shortness of breath, diaphoresis, orthopnea, PND.  She was given ASA 324 mg and Nitro SL x1 via EMS w/o relief of pain.  Pain currently 10/10.  AF, VSS, NAD at presentation.  Physical exam as above.  Presentation concerning for ACS or PE.  Will get labs including cardiac enzymes.  Pt low-risk by [facility] and will get D-dimer.  Will give additional nitro and reassess.  ECG: rate 71, NSR, nml axis, nml intervals, no ST or T-wave  changes.  Nml ECG.  No old ECG for comparison.  WBC slightly elevated at 11.9.  BMP wnl.  D-dimer negative.  Troponin negative.  CXR with patchy bibasilar infiltrate or atelectasis.  Pt continues to have pain after nitro.  Will give morphine.  Pain improved after morphine.  Delta troponin negative.  Description of chest pain atypical.  As chest wall TTP and pain worsened by movement, doubt ACS.  CTA of the chest negative for pulmonary embolism.  No infiltrate seen.  Recommend close PCP follow-up for further evaluation.  Will DC home with short course of pain meds.  Tx plan discussed with pt who voiced understanding and will follow-up.  Return precautions provided.  Cherre Robins, MD 01/08/12 [phone]

## 2012-01-08 NOTE — ED Notes (Signed)
Per ems- pt c/o left sided cp since 1430 today. Pt states pain radiates into her armpit. Pain is consistent and stabbing, increases with deep inspiration. NSR, unremarkable 12 lead. Pt receive 324asa and 1 nitro with no relief. Pain 10/10. 20g IV LAC. BP-168/88 HR-84 R-18 RA SPO2-100%

## 2012-01-08 NOTE — ED Notes (Addendum)
Pt states "it feels like a cramp behind my left breast and every now and then it stabs." Pt tearful. Pt states she has fibrocystic breasts and has found a new lump in left breast that is very sore. Pt states pain is worse with deep inspiration and when she talks. No radiation. Denies dyspnea, n/v, sob.

## 2012-01-09 NOTE — ED Provider Notes (Signed)
I saw and evaluated the patient, reviewed the resident's note and I agree with the findings and plan.  I reviewed and agree with ECG interpretation by Dr. Christain Sacramento.  PT with atypical CP, changed while here, became more reproducible, improved some with analgesics, no ischemia on ECG, chest CT shows no PE, no pneumonia.  Troponin times 2 neg, can follow up with PCP.  Vitals normal.    Gavin Pound. Malayasia Mirkin, MD 01/09/12 0001

## 2012-06-21 ENCOUNTER — Other Ambulatory Visit (HOSPITAL_COMMUNITY)
Admission: RE | Admit: 2012-06-21 | Discharge: 2012-06-21 | Disposition: A | Discharge status: Home / Self Care | Payer: BC Managed Care – PPO | Source: Ambulatory Visit | Attending: Family Medicine | Admitting: Family Medicine

## 2012-06-21 DIAGNOSIS — Z1151 Encounter for screening for human papillomavirus (HPV): Secondary | ICD-10-CM | POA: Insufficient documentation

## 2012-06-21 DIAGNOSIS — Z01419 Encounter for gynecological examination (general) (routine) without abnormal findings: Secondary | ICD-10-CM | POA: Insufficient documentation

## 2012-11-05 ENCOUNTER — Other Ambulatory Visit: Payer: Self-pay | Admitting: Family Medicine

## 2012-11-05 ENCOUNTER — Other Ambulatory Visit (HOSPITAL_COMMUNITY)
Admission: RE | Admit: 2012-11-05 | Discharge: 2012-11-05 | Disposition: A | Discharge status: Home / Self Care | Payer: BC Managed Care – PPO | Source: Ambulatory Visit | Attending: Family Medicine | Admitting: Family Medicine

## 2012-11-05 DIAGNOSIS — Z113 Encounter for screening for infections with a predominantly sexual mode of transmission: Secondary | ICD-10-CM | POA: Insufficient documentation

## 2012-11-05 DIAGNOSIS — N760 Acute vaginitis: Secondary | ICD-10-CM | POA: Insufficient documentation

## 2013-04-01 ENCOUNTER — Encounter (HOSPITAL_BASED_OUTPATIENT_CLINIC_OR_DEPARTMENT_OTHER): Payer: Self-pay | Admitting: Emergency Medicine

## 2013-04-01 ENCOUNTER — Emergency Department (HOSPITAL_BASED_OUTPATIENT_CLINIC_OR_DEPARTMENT_OTHER): Payer: BC Managed Care – PPO

## 2013-04-01 ENCOUNTER — Emergency Department (HOSPITAL_BASED_OUTPATIENT_CLINIC_OR_DEPARTMENT_OTHER)
Admission: EM | Admit: 2013-04-01 | Discharge: 2013-04-02 | Disposition: A | Discharge status: Home / Self Care | Payer: BC Managed Care – PPO | Attending: Emergency Medicine | Admitting: Emergency Medicine

## 2013-04-01 DIAGNOSIS — Z91040 Latex allergy status: Secondary | ICD-10-CM | POA: Insufficient documentation

## 2013-04-01 DIAGNOSIS — S0993XA Unspecified injury of face, initial encounter: Secondary | ICD-10-CM | POA: Insufficient documentation

## 2013-04-01 DIAGNOSIS — I10 Essential (primary) hypertension: Secondary | ICD-10-CM | POA: Insufficient documentation

## 2013-04-01 DIAGNOSIS — Z79899 Other long term (current) drug therapy: Secondary | ICD-10-CM | POA: Insufficient documentation

## 2013-04-01 DIAGNOSIS — S46909A Unspecified injury of unspecified muscle, fascia and tendon at shoulder and upper arm level, unspecified arm, initial encounter: Secondary | ICD-10-CM | POA: Insufficient documentation

## 2013-04-01 DIAGNOSIS — F17200 Nicotine dependence, unspecified, uncomplicated: Secondary | ICD-10-CM | POA: Insufficient documentation

## 2013-04-01 DIAGNOSIS — S4980XA Other specified injuries of shoulder and upper arm, unspecified arm, initial encounter: Secondary | ICD-10-CM | POA: Insufficient documentation

## 2013-04-01 DIAGNOSIS — Y92410 Unspecified street and highway as the place of occurrence of the external cause: Secondary | ICD-10-CM | POA: Insufficient documentation

## 2013-04-01 DIAGNOSIS — Y9389 Activity, other specified: Secondary | ICD-10-CM | POA: Insufficient documentation

## 2013-04-01 DIAGNOSIS — E119 Type 2 diabetes mellitus without complications: Secondary | ICD-10-CM | POA: Insufficient documentation
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
## 2013-04-02 MED ORDER — HYDROCODONE-ACETAMINOPHEN 5-325 MG PO TABS: 1.0000 | ORAL_TABLET | Freq: Four times a day (QID) | ORAL | Status: DC | PRN
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
## 2013-05-23 IMAGING — CT CT ANGIO CHEST
2 of 6 series · 19 of 46 positions shown · IV contrast (APPLIED)
Comparison: Chest radiograph 01/08/2012.

CLINICAL DATA: Left-sided chest pain and tightness.

CT ANGIOGRAPHY CHEST
TECHNIQUE: Multidetector CT imaging of the chest using the
standard protocol during bolus administration of intravenous
contrast. Multiplanar reconstructed images including MIPs were
obtained and reviewed to evaluate the vascular anatomy.
Contrast: 80mL OMNIPAQUE IOHEXOL 350 MG/ML SOLN

[Series 6: pulm embolism 1.0 b25f thin · axial · 0.64mm/px · z∈[-221,+13]mm · 16 of 258 slices shown]
[im 12/258  lung]
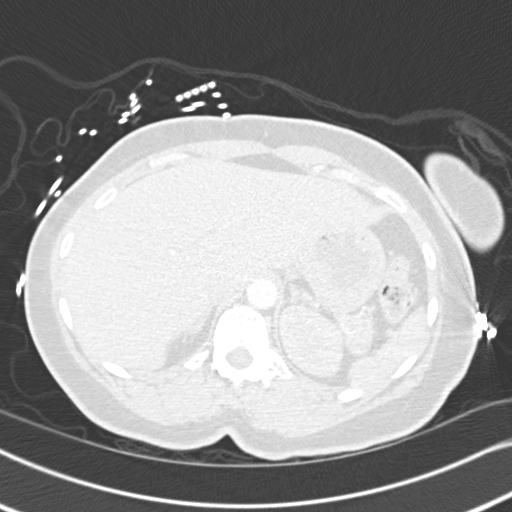
[im 34/258  soft-tissue]
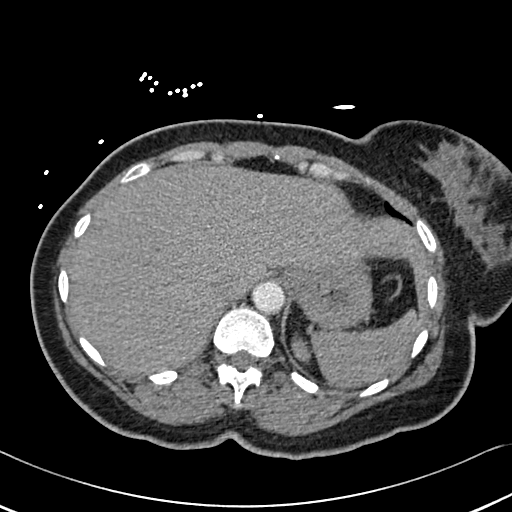
[im 45/258  lung]
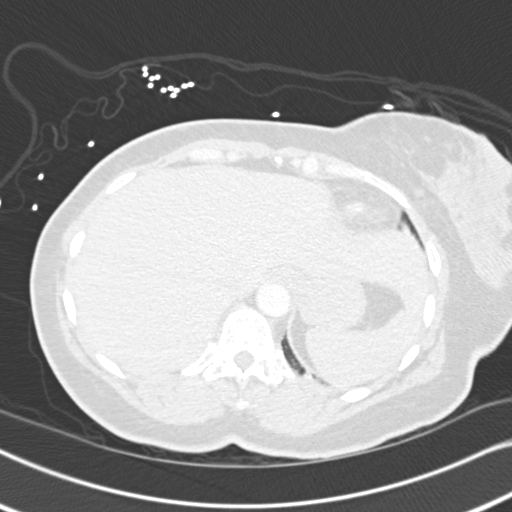
[im 56/258  soft-tissue]
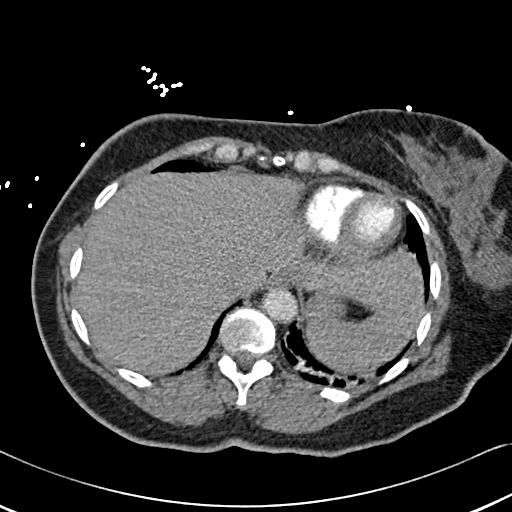
[im 79/258  lung]
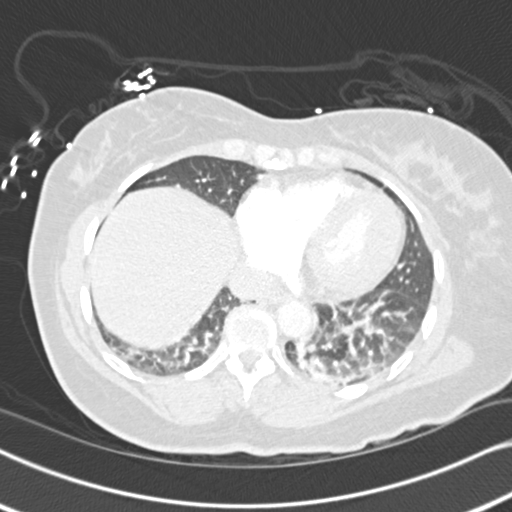
[im 90/258  soft-tissue]
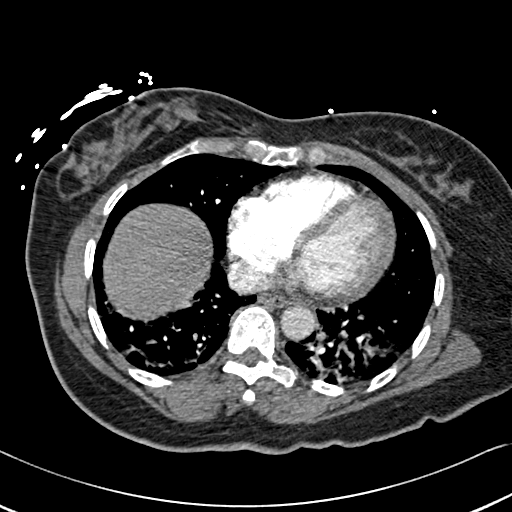
[im 101/258  lung]
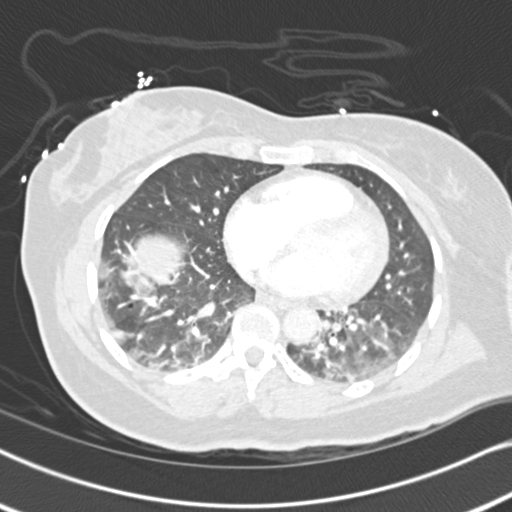
[im 123/258  soft-tissue]
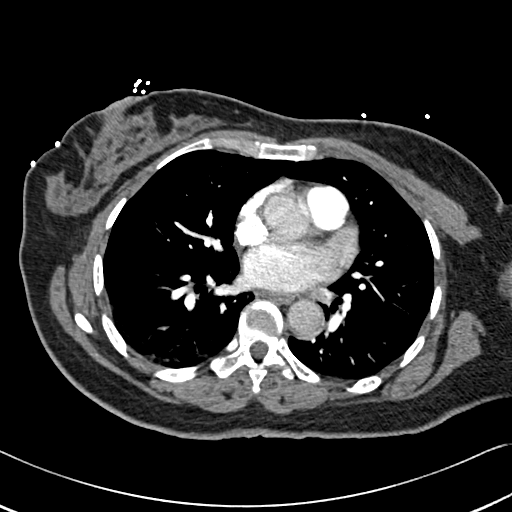
[im 135/258  lung]
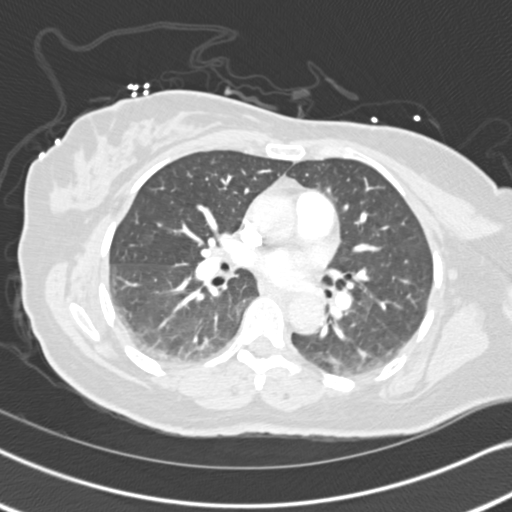
[im 157/258  soft-tissue]
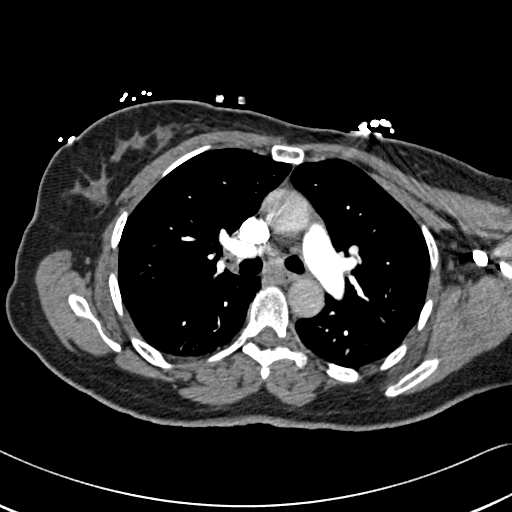
[im 168/258  lung]
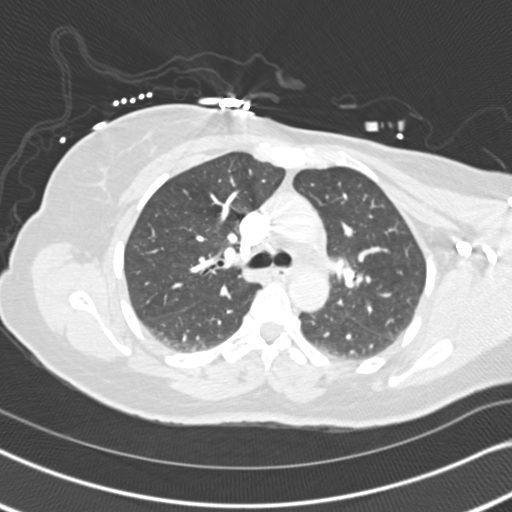
[im 179/258  soft-tissue]
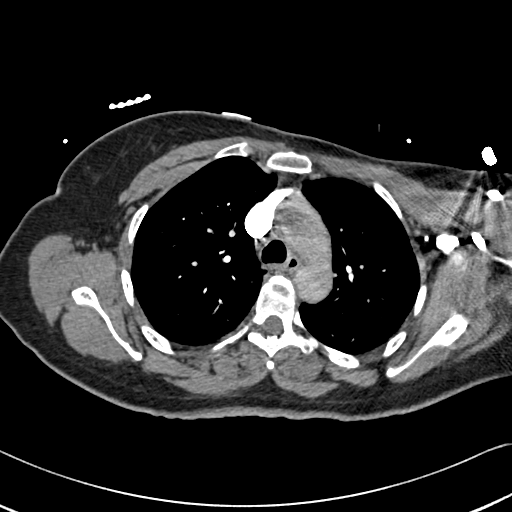
[im 202/258  lung]
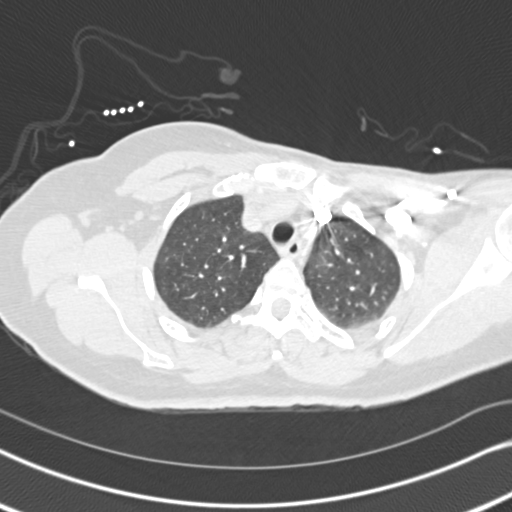
[im 213/258  soft-tissue]
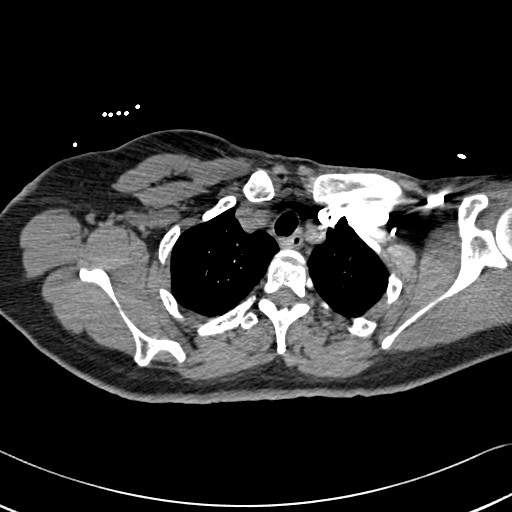
[im 224/258  lung]
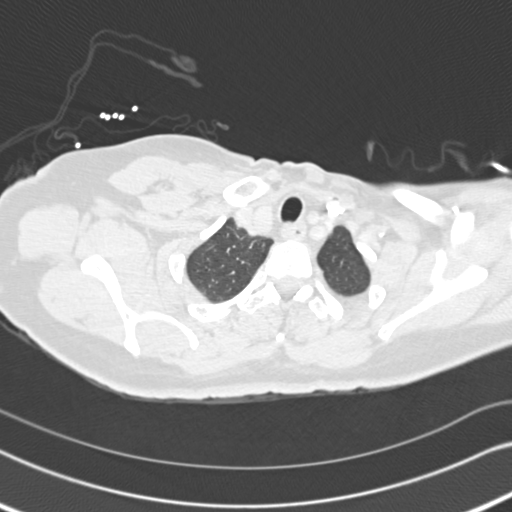
[im 246/258  soft-tissue]
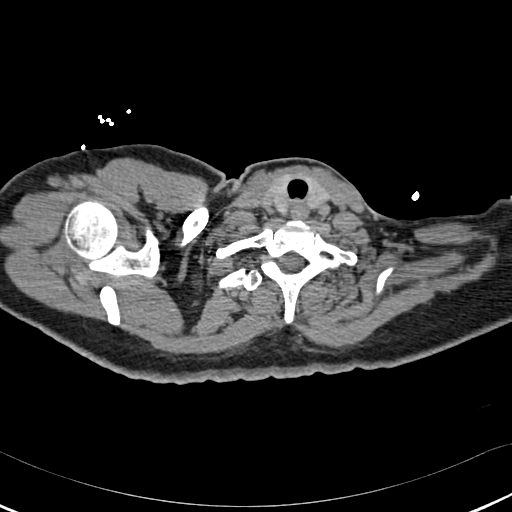

[Series 602: cor · coronal · 0.64mm/px · 3 of 83 slices shown]
[im 21/83  soft-tissue]
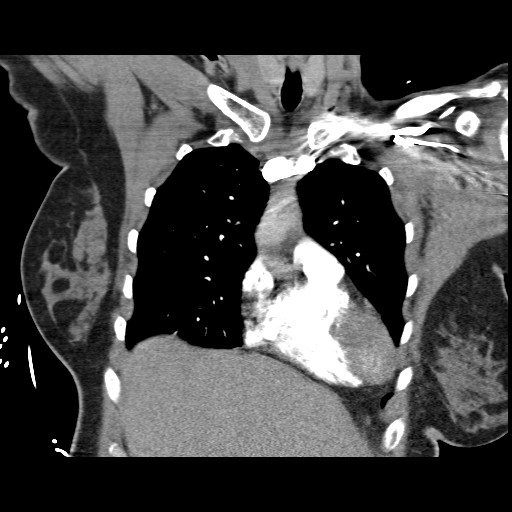
[im 42/83  soft-tissue]
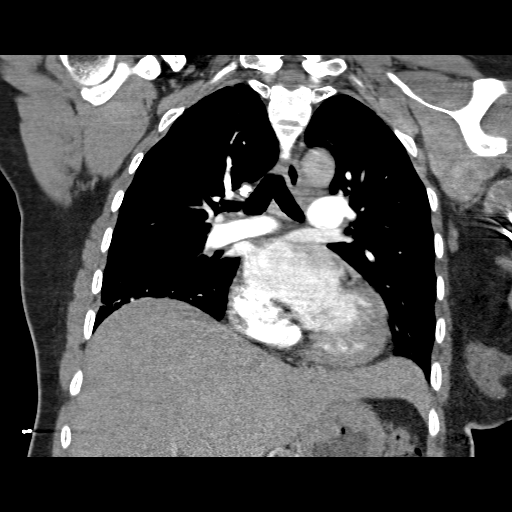
[im 62/83  soft-tissue]
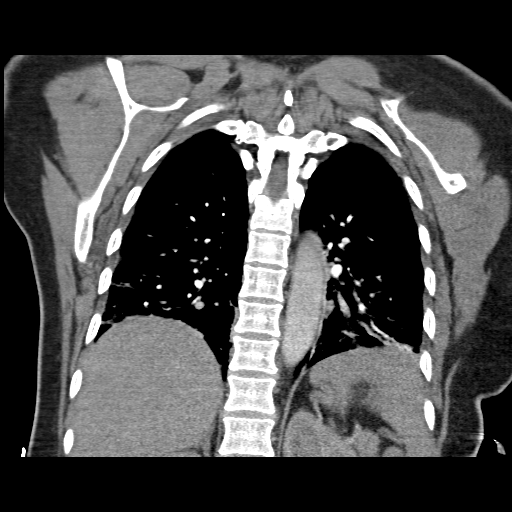

[19 of 46 positions shown; findings below may reference images not displayed]

FINDINGS: Technically adequate study with good opacification of the
central and segmental pulmonary arteries.  No focal filling defects
are demonstrated.  No evidence of significant pulmonary embolus.

Normal heart size.  Normal caliber thoracic aorta.  No significant
lymphadenopathy in the chest.  Esophagus is mostly decompressed.
Visualized portions of the upper abdominal organs are grossly
unremarkable.  No pleural effusions.

There is infiltration or atelectasis in both lung bases.  Right
midlung pneumonitis seal.  No pneumothorax.  Airways appear patent.
Normal alignment of the thoracic vertebra.  Sternum appears intact.
IMPRESSION: No evidence of significant pulmonary embolus.  Infiltration or
atelectasis in both lung bases.

## 2013-08-09 ENCOUNTER — Encounter (HOSPITAL_COMMUNITY): Payer: Self-pay | Admitting: Emergency Medicine

## 2013-08-09 ENCOUNTER — Emergency Department (HOSPITAL_COMMUNITY)
Admission: EM | Admit: 2013-08-09 | Discharge: 2013-08-10 | Disposition: A | Discharge status: Home / Self Care | Payer: BC Managed Care – PPO | Attending: Emergency Medicine | Admitting: Emergency Medicine

## 2013-08-09 DIAGNOSIS — Z90710 Acquired absence of both cervix and uterus: Secondary | ICD-10-CM | POA: Insufficient documentation

## 2013-08-09 DIAGNOSIS — E119 Type 2 diabetes mellitus without complications: Secondary | ICD-10-CM | POA: Insufficient documentation

## 2013-08-09 DIAGNOSIS — I10 Essential (primary) hypertension: Secondary | ICD-10-CM | POA: Insufficient documentation

## 2013-08-09 DIAGNOSIS — K5790 Diverticulosis of intestine, part unspecified, without perforation or abscess without bleeding: Secondary | ICD-10-CM

## 2013-08-09 DIAGNOSIS — Z91040 Latex allergy status: Secondary | ICD-10-CM | POA: Insufficient documentation

## 2013-08-09 DIAGNOSIS — K5732 Diverticulitis of large intestine without perforation or abscess without bleeding: Secondary | ICD-10-CM | POA: Insufficient documentation

## 2013-08-09 DIAGNOSIS — F17200 Nicotine dependence, unspecified, uncomplicated: Secondary | ICD-10-CM | POA: Insufficient documentation

## 2013-08-09 DIAGNOSIS — Z79899 Other long term (current) drug therapy: Secondary | ICD-10-CM | POA: Insufficient documentation

## 2013-08-09 DIAGNOSIS — R109 Unspecified abdominal pain: Secondary | ICD-10-CM

## 2013-08-09 LAB — URINALYSIS, ROUTINE W REFLEX MICROSCOPIC
Bilirubin Urine: NEGATIVE
GLUCOSE, UA: NEGATIVE mg/dL
HGB URINE DIPSTICK: NEGATIVE
Ketones, ur: NEGATIVE mg/dL
LEUKOCYTES UA: NEGATIVE
Nitrite: NEGATIVE
PH: 6.5 (ref 5.0–8.0)
Protein, ur: NEGATIVE mg/dL
Specific Gravity, Urine: 1.018 (ref 1.005–1.030)
Urobilinogen, UA: 0.2 mg/dL (ref 0.0–1.0)

## 2013-08-09 NOTE — ED Notes (Signed)
Pt arrived to the ED with a complaint of abdominal pain.  Pt has experienced lower abdominal pain all around her lower abdomen with a concentration in he r left side.  Pt has had a hysterectomy but feels as if it is her ovary that is causing her pain.  Pt is unable to side on her left side and walking causes her pain as well.

## 2013-08-10 ENCOUNTER — Emergency Department (HOSPITAL_COMMUNITY): Payer: BC Managed Care – PPO

## 2013-08-10 LAB — COMPREHENSIVE METABOLIC PANEL
ALBUMIN: 4.2 g/dL (ref 3.5–5.2)
ALK PHOS: 73 U/L (ref 39–117)
ALT: 15 U/L (ref 0–35)
AST: 17 U/L (ref 0–37)
BUN: 21 mg/dL (ref 6–23)
CHLORIDE: 97 meq/L (ref 96–112)
CO2: 29 meq/L (ref 19–32)
Calcium: 10.1 mg/dL (ref 8.4–10.5)
Creatinine, Ser: 0.74 mg/dL (ref 0.50–1.10)
GLUCOSE: 99 mg/dL (ref 70–99)
POTASSIUM: 3.8 meq/L (ref 3.7–5.3)
Sodium: 139 mEq/L (ref 137–147)
Total Protein: 7.8 g/dL (ref 6.0–8.3)

## 2013-08-10 LAB — CBC
HEMATOCRIT: 38.1 % (ref 36.0–46.0)
HEMOGLOBIN: 12.8 g/dL (ref 12.0–15.0)
MCH: 28.1 pg (ref 26.0–34.0)
MCHC: 33.6 g/dL (ref 30.0–36.0)
MCV: 83.7 fL (ref 78.0–100.0)
Platelets: 428 10*3/uL — ABNORMAL HIGH (ref 150–400)
RBC: 4.55 MIL/uL (ref 3.87–5.11)
RDW: 13.9 % (ref 11.5–15.5)
WBC: 11.4 10*3/uL — AB (ref 4.0–10.5)

## 2013-08-10 LAB — LIPASE, BLOOD: LIPASE: 31 U/L (ref 11–59)

## 2013-08-10 MED ORDER — ONDANSETRON HCL 4 MG/2ML IJ SOLN
4.0000 mg | Freq: Once | INTRAMUSCULAR | Status: AC
  Administered 2013-08-10: 4 mg via INTRAVENOUS
  Filled 2013-08-10: qty 2

## 2013-08-10 MED ORDER — SODIUM CHLORIDE 0.9 % IV SOLN
INTRAVENOUS | Status: DC
  Administered 2013-08-10: 01:00:00 via INTRAVENOUS

## 2013-08-10 MED ORDER — FENTANYL CITRATE 0.05 MG/ML IJ SOLN
50.0000 ug | INTRAMUSCULAR | Status: DC | PRN
  Administered 2013-08-10: 50 ug via INTRAVENOUS
  Filled 2013-08-10: qty 2

## 2013-08-10 MED ORDER — IOHEXOL 300 MG/ML  SOLN
100.0000 mL | Freq: Once | INTRAMUSCULAR | Status: AC | PRN
  Administered 2013-08-10: 100 mL via INTRAVENOUS

## 2013-08-10 MED ORDER — HYDROCODONE-ACETAMINOPHEN 5-325 MG PO TABS: 1.0000 | ORAL_TABLET | Freq: Four times a day (QID) | ORAL | Status: DC | PRN

## 2013-08-10 MED ORDER — IOHEXOL 300 MG/ML  SOLN
50.0000 mL | Freq: Once | INTRAMUSCULAR | Status: AC | PRN
  Administered 2013-08-10: 50 mL via ORAL

## 2013-08-10 NOTE — Discharge Instructions (Signed)
Abdominal Pain, Women °Abdominal (stomach, pelvic, or belly) pain can be caused by many things. It is important to tell your doctor: °· The location of the pain. °· Does it come and go or is it present all the time? °· Are there things that start the pain (eating certain foods, exercise)? °· Are there other symptoms associated with the pain (fever, nausea, vomiting, diarrhea)? °All of this is helpful to know when trying to find the cause of the pain. °CAUSES  °· Stomach: virus or bacteria infection, or ulcer. °· Intestine: appendicitis (inflamed appendix), regional ileitis (Crohn's disease), ulcerative colitis (inflamed colon), irritable bowel syndrome, diverticulitis (inflamed diverticulum of the colon), or cancer of the stomach or intestine. °· Gallbladder disease or stones in the gallbladder. °· Kidney disease, kidney stones, or infection. °· Pancreas infection or cancer. °· Fibromyalgia (pain disorder). °· Diseases of the female organs: °· Uterus: fibroid (non-cancerous) tumors or infection. °· Fallopian tubes: infection or tubal pregnancy. °· Ovary: cysts or tumors. °· Pelvic adhesions (scar tissue). °· Endometriosis (uterus lining tissue growing in the pelvis and on the pelvic organs). °· Pelvic congestion syndrome (female organs filling up with blood just before the menstrual period). °· Pain with the menstrual period. °· Pain with ovulation (producing an egg). °· Pain with an IUD (intrauterine device, birth control) in the uterus. °· Cancer of the female organs. °· Functional pain (pain not caused by a disease, may improve without treatment). °· Psychological pain. °· Depression. °DIAGNOSIS  °Your doctor will decide the seriousness of your pain by doing an examination. °· Blood tests. °· X-rays. °· Ultrasound. °· CT scan (computed tomography, special type of X-ray). °· MRI (magnetic resonance imaging). °· Cultures, for infection. °· Barium enema (dye inserted in the large intestine, to better view it with  X-rays). °· Colonoscopy (looking in intestine with a lighted tube). °· Laparoscopy (minor surgery, looking in abdomen with a lighted tube). °· Major abdominal exploratory surgery (looking in abdomen with a large incision). °TREATMENT  °The treatment will depend on the cause of the pain.  °· Many cases can be observed and treated at home. °· Over-the-counter medicines recommended by your caregiver. °· Prescription medicine. °· Antibiotics, for infection. °· Birth control pills, for painful periods or for ovulation pain. °· Hormone treatment, for endometriosis. °· Nerve blocking injections. °· Physical therapy. °· Antidepressants. °· Counseling with a psychologist or psychiatrist. °· Minor or major surgery. °HOME CARE INSTRUCTIONS  °· Do not take laxatives, unless directed by your caregiver. °· Take over-the-counter pain medicine only if ordered by your caregiver. Do not take aspirin because it can cause an upset stomach or bleeding. °· Try a clear liquid diet (broth or water) as ordered by your caregiver. Slowly move to a bland diet, as tolerated, if the pain is related to the stomach or intestine. °· Have a thermometer and take your temperature several times a day, and record it. °· Bed rest and sleep, if it helps the pain. °· Avoid sexual intercourse, if it causes pain. °· Avoid stressful situations. °· Keep your follow-up appointments and tests, as your caregiver orders. °· If the pain does not go away with medicine or surgery, you may try: °· Acupuncture. °· Relaxation exercises (yoga, meditation). °· Group therapy. °· Counseling. °SEEK MEDICAL CARE IF:  °· You notice certain foods cause stomach pain. °· Your home care treatment is not helping your pain. °· You need stronger pain medicine. °· You want your IUD removed. °· You feel faint or   lightheaded. °· You develop nausea and vomiting. °· You develop a rash. °· You are having side effects or an allergy to your medicine. °SEEK IMMEDIATE MEDICAL CARE IF:  °· Your  pain does not go away or gets worse. °· You have a fever. °· Your pain is felt only in portions of the abdomen. The right side could possibly be appendicitis. The left lower portion of the abdomen could be colitis or diverticulitis. °· You are passing blood in your stools (bright red or black tarry stools, with or without vomiting). °· You have blood in your urine. °· You develop chills, with or without a fever. °· You pass out. °MAKE SURE YOU:  °· Understand these instructions. °· Will watch your condition. °· Will get help right away if you are not doing well or get worse. °Document Released: 03/05/2007 Document Revised: 07/31/2011 Document Reviewed: 03/25/2009 °[employer]® Patient Information ©2014 [employer]. ° °

## 2013-08-10 NOTE — ED Provider Notes (Signed)
CSN: [id number]     Arrival date & time 08/09/13  2048 History   First MD Initiated Contact with Patient 08/10/13 0026     Chief Complaint  Patient presents with  . Abdominal Pain     (Consider location/radiation/quality/duration/timing/severity/associated sxs/prior Treatment) HPI  Hx provided by patient. Left lower quadrant abdominal pain, ongoing for the last few days and more severe tonight. Complains of swelling in that area. Pain is sharp in quality and moderate to severe. Not radiating. No associated nausea vomiting or diarrhea. No history of similar symptoms. No vaginal bleeding or discharge/ previous hysterectomy. Back pain. No trauma. No history of same.   Past Medical History  Diagnosis Date  . Hypertension   . Diabetes mellitus    Past Surgical History  Procedure Laterality Date  . Abdominal hysterectomy    . Rectal fissure    . Foot surgery     History reviewed. No pertinent family history. History  Substance Use Topics  . Smoking status: Current Every Day Smoker -- 0.50 packs/day    Types: Cigarettes  . Smokeless tobacco: Not on file  . Alcohol Use: No   OB History   Grav Para Term Preterm Abortions TAB SAB Ect Mult Living                 Review of Systems  Constitutional: Positive for chills. Negative for fever.  Respiratory: Negative for shortness of breath.   Cardiovascular: Negative for chest pain.  Gastrointestinal: Positive for abdominal pain. Negative for nausea, vomiting and blood in stool.  Genitourinary: Negative for dysuria.  Musculoskeletal: Negative for back pain.  Skin: Negative for rash.  Neurological: Negative for weakness and numbness.  All other systems reviewed and are negative.      Allergies  Hydrochlorothiazide and Latex  Home Medications   Current Outpatient Rx  Name  Route  Sig  Dispense  Refill  . amLODipine (NORVASC) 5 MG tablet   Oral   Take 5 mg by mouth daily.         . chlorthalidone (HYGROTON) 25 MG tablet    Oral   Take 12.5 mg by mouth daily.          . Linagliptin-Metformin HCl (JENTADUETO) 2.09-998 MG TABS   Oral   Take 1 tablet by mouth daily.         Marland Kitchen lisinopril (PRINIVIL,ZESTRIL) 40 MG tablet   Oral   Take 40 mg by mouth daily.         . Multiple Vitamin (MULITIVITAMIN WITH MINERALS) TABS   Oral   Take 1 tablet by mouth daily.          BP 117/69  Pulse 94  Temp(Src) 98.3 F (36.8 C) (Oral)  Resp 20  Wt 132 lb (59.875 kg)  SpO2 98% Physical Exam  Constitutional: She is oriented to person, place, and time. She appears well-developed and well-nourished.  HENT:  Head: Normocephalic and atraumatic.  Eyes: EOM are normal. Pupils are equal, round, and reactive to light.  Neck: Neck supple.  Cardiovascular: Normal rate, regular rhythm and intact distal pulses.   Pulmonary/Chest: Effort normal and breath sounds normal. No respiratory distress. She exhibits no tenderness.  Abdominal: Soft. Bowel sounds are normal. She exhibits no distension. There is no rebound and no guarding.  Tender to palpation left lower quadrant, no tenderness otherwise. No CVA tenderness  Musculoskeletal: Normal range of motion. She exhibits no edema.  Neurological: She is alert and oriented to person, place, and time.  Skin: Skin is warm and dry.    ED Course  Procedures (including critical care time) Labs Review Labs Reviewed  URINALYSIS, ROUTINE W REFLEX MICROSCOPIC - Abnormal; Notable for the following:    APPearance CLOUDY (*)    All other components within normal limits  CBC - Abnormal; Notable for the following:    WBC 11.4 (*)    Platelets 428 (*)    All other components within normal limits  COMPREHENSIVE METABOLIC PANEL - Abnormal; Notable for the following:    Total Bilirubin <0.2 (*)    All other components within normal limits  LIPASE, BLOOD   Imaging Review US Transvaginal Non-ob  08/10/2013   CLINICAL DATA:  Prior hysterectomy and right salpingo-oophorectomy. Patient  presents with left lower quadrant abdominal pain and left-sided pelvic pain.  EXAM: TRANSABDOMINAL AND TRANSVAGINAL ULTRASOUND OF PELVIS  TECHNIQUE: Both transabdominal and transvaginal ultrasound examinations of the pelvis were performed. Transabdominal technique was performed for global imaging of the pelvis including uterus, ovaries, adnexal regions, and pelvic cul-de-sac. It was necessary to proceed with endovaginal exam following the transabdominal exam to visualize the left ovary.  COMPARISON:  CT abdomen and pelvis performed earlier same date. No prior ultrasound.  FINDINGS: Uterus  Surgically absent.  Endometrium  Surgically absent.  Right ovary  Surgically absent.  Left ovary  Not visualized either transabdominally or transvaginally due to abundant bowel gas in the left side of the pelvis. Of note, no left ovarian mass or inflammatory changes in the left side of the pelvis were identified on the prior CT.  Other findings  None.  IMPRESSION: Nonvisualization of the left ovary in this patient who has undergone prior hysterectomy and right salpingo-oophorectomy.   Electronically Signed   By: Evangeline Dakin M.D.   On: 08/10/2013 06:42   US Pelvis Complete  08/10/2013   CLINICAL DATA:  Prior hysterectomy and right salpingo-oophorectomy. Patient presents with left lower quadrant abdominal pain and left-sided pelvic pain.  EXAM: TRANSABDOMINAL AND TRANSVAGINAL ULTRASOUND OF PELVIS  TECHNIQUE: Both transabdominal and transvaginal ultrasound examinations of the pelvis were performed. Transabdominal technique was performed for global imaging of the pelvis including uterus, ovaries, adnexal regions, and pelvic cul-de-sac. It was necessary to proceed with endovaginal exam following the transabdominal exam to visualize the left ovary.  COMPARISON:  CT abdomen and pelvis performed earlier same date. No prior ultrasound.  FINDINGS: Uterus  Surgically absent.  Endometrium  Surgically absent.  Right ovary  Surgically  absent.  Left ovary  Not visualized either transabdominally or transvaginally due to abundant bowel gas in the left side of the pelvis. Of note, no left ovarian mass or inflammatory changes in the left side of the pelvis were identified on the prior CT.  Other findings  None.  IMPRESSION: Nonvisualization of the left ovary in this patient who has undergone prior hysterectomy and right salpingo-oophorectomy.   Electronically Signed   By: Evangeline Dakin M.D.   On: 08/10/2013 06:42   Ct Abdomen Pelvis W Contrast  08/10/2013   CLINICAL DATA:  Right lower quadrant abdominal pain. Leukocytosis with white blood count of 11,400.  EXAM: CT ABDOMEN AND PELVIS WITH CONTRAST  TECHNIQUE: Multidetector CT imaging of the abdomen and pelvis was performed using the standard protocol following bolus administration of intravenous contrast.  CONTRAST:  18mL OMNIPAQUE IOHEXOL 300 MG/ML IV.  COMPARISON:  08/31/2006.  FINDINGS: Normal-appearing appendix in the right upper and mid pelvis. Stomach normal in appearance, filled with food and oral contrast. Normal appearing  small bowel. Scattered sigmoid colon diverticula without evidence of acute diverticulitis. Remainder of the colon unremarkable. No ascites.  Mild diffuse hepatic steatosis without focal hepatic parenchymal abnormality. Anatomic variant in that the left lobe of the liver extends well across the midline into the left upper quadrant. Normal appearing spleen, pancreas, adrenal glands, kidneys, and gallbladder. No biliary ductal dilation. Mild to moderate aortoiliac atherosclerosis. No significant lymphadenopathy.  Urinary bladder normal in appearance. Uterus surgically absent. No adnexal masses or free pelvic fluid. Phleboliths low in the right side of the pelvis.  Bone window images unremarkable. Visualized lung bases clear apart from the expected dependent atelectasis. Heart size normal.  IMPRESSION: 1. No acute abnormalities involving the abdomen or pelvis.  Specifically, no evidence of appendicitis. 2. Scattered sigmoid colon diverticula without evidence of acute diverticulitis. 3. Mild diffuse hepatic steatosis.   Electronically Signed   By: Evangeline Dakin M.D.   On: 08/10/2013 03:07    PT initially did not want any pain medications, on recheck is would like something in her IV. IV fentanyl provided. IV Zofran.   On recheck after CT scan, her pain improved, but when she sits up, the pain returns. On repeat exam, remains TTP LLQ.  PT expresses concern about her left ovary and after period of discussion is requesting an ultrasound.    [place] results shared with patient as above.  She became upset that her ovary was not visualized.  She expressed concern about her care in the ER and concern that her ultrasound had not been properly performed.  After a long discussion, she refused any further treatment or evaluation, states understanding precautions and agrees to follow up with her PCP.  Short course of pain medications prescribed and patient was given work for today by request.      MDM   Dx: LLQ ABD pain  Evaluated with CT scan, Ultrasound, labs and UA as above. No UTI, no colitis, no acute findings on work up to explain her symptoms.  Pain treated with IV narcotics and she did have some intermittent relief of symptoms. No concerning features on work up to indicate need for admit or further work up in the ER. PT has close PCP follow up, she has had a recent colonoscopy in the last few years.  She is stable and appropriate for discharge at this time.   Teressa Lower, MD 08/11/13 0005

## 2013-08-10 NOTE — ED Notes (Signed)
Pt returned from CT scan. Pt ambulatory to bathroom. Reports feeling "full" from drinking contrast. Sts pain reduced and tolerable after pain meds.

## 2013-08-10 NOTE — ED Notes (Signed)
Pt left very upset stating that she wanted to speak to Dr. Montez Morita about "why he never examined me, he never touched me, he just went and ordered a bunch of damn tests. I don't feel I was treated fairly or appropriately. I want the number for patient relations. Then he sent some person in here that don't even know how to do a damn ultrasound." RN out to speak with Dr. Montez Morita who was leaving dept. He stated he had already done d/c instructions and reviewed test results with pt and that she needed to follow up as instructed. RN back in with pt to relay this and pt more angry as RN was trying to explain everything that was already performed and the scope of the emergency room. Asked pt to sign for d/c and pt initially refused stating she wasn't going to sign because she didn't agree she was treated at all. Reminded pt of lab tests, medications and imaging that were performed and pt jerked signature pad away from nurse and signed pad. RN attempted to complete discharge with pain scale and d/c vitals, however pt continued to walk out of room and refused to answer any more questions, other than to say her pain scale "was the same as when I walked in here." Pt ambulatory out of ED on her own accord with d/c paperwork, Rx and work excuse.

## 2013-08-10 NOTE — ED Notes (Signed)
US at bedside

## 2013-08-20 ENCOUNTER — Other Ambulatory Visit: Payer: Self-pay | Admitting: Family Medicine

## 2013-08-20 DIAGNOSIS — R109 Unspecified abdominal pain: Secondary | ICD-10-CM

## 2013-08-22 ENCOUNTER — Ambulatory Visit
Admission: RE | Admit: 2013-08-22 | Discharge: 2013-08-22 | Disposition: A | Discharge status: Home / Self Care | Payer: BC Managed Care – PPO | Source: Ambulatory Visit | Attending: Family Medicine | Admitting: Family Medicine

## 2013-08-22 DIAGNOSIS — R109 Unspecified abdominal pain: Secondary | ICD-10-CM

## 2013-08-22 MED ORDER — IOHEXOL 300 MG/ML  SOLN
100.0000 mL | Freq: Once | INTRAMUSCULAR | Status: AC | PRN
  Administered 2013-08-22: 100 mL via INTRAVENOUS

## 2013-11-27 ENCOUNTER — Encounter (HOSPITAL_BASED_OUTPATIENT_CLINIC_OR_DEPARTMENT_OTHER): Payer: Self-pay | Admitting: Emergency Medicine

## 2013-11-27 ENCOUNTER — Emergency Department (HOSPITAL_BASED_OUTPATIENT_CLINIC_OR_DEPARTMENT_OTHER): Payer: Worker's Compensation

## 2013-11-27 ENCOUNTER — Emergency Department (HOSPITAL_BASED_OUTPATIENT_CLINIC_OR_DEPARTMENT_OTHER)
Admission: EM | Admit: 2013-11-27 | Discharge: 2013-11-27 | Disposition: A | Discharge status: Home / Self Care | Payer: Worker's Compensation | Attending: Emergency Medicine | Admitting: Emergency Medicine

## 2013-11-27 DIAGNOSIS — F17200 Nicotine dependence, unspecified, uncomplicated: Secondary | ICD-10-CM | POA: Insufficient documentation

## 2013-11-27 DIAGNOSIS — E119 Type 2 diabetes mellitus without complications: Secondary | ICD-10-CM | POA: Insufficient documentation

## 2013-11-27 DIAGNOSIS — S139XXA Sprain of joints and ligaments of unspecified parts of neck, initial encounter: Secondary | ICD-10-CM | POA: Diagnosis not present

## 2013-11-27 DIAGNOSIS — Z91040 Latex allergy status: Secondary | ICD-10-CM | POA: Diagnosis not present

## 2013-11-27 DIAGNOSIS — Z79899 Other long term (current) drug therapy: Secondary | ICD-10-CM | POA: Insufficient documentation

## 2013-11-27 DIAGNOSIS — S0990XA Unspecified injury of head, initial encounter: Secondary | ICD-10-CM

## 2013-11-27 DIAGNOSIS — Y9389 Activity, other specified: Secondary | ICD-10-CM | POA: Insufficient documentation

## 2013-11-27 DIAGNOSIS — I10 Essential (primary) hypertension: Secondary | ICD-10-CM | POA: Insufficient documentation

## 2013-11-27 DIAGNOSIS — IMO0002 Reserved for concepts with insufficient information to code with codable children: Secondary | ICD-10-CM | POA: Diagnosis not present

## 2013-11-27 DIAGNOSIS — Y929 Unspecified place or not applicable: Secondary | ICD-10-CM | POA: Diagnosis not present

## 2013-11-27 DIAGNOSIS — S161XXA Strain of muscle, fascia and tendon at neck level, initial encounter: Secondary | ICD-10-CM

## 2013-11-27 MED ORDER — TRAMADOL HCL 50 MG PO TABS: 50.0000 mg | ORAL_TABLET | Freq: Four times a day (QID) | ORAL | Status: DC | PRN

## 2013-11-27 MED ORDER — IBUPROFEN 800 MG PO TABS
800.0000 mg | ORAL_TABLET | Freq: Three times a day (TID) | ORAL | Status: DC
Start: 2013-11-27 — End: 2014-11-13

## 2013-11-27 NOTE — ED Provider Notes (Signed)
CSN: [id number]     Arrival date & time 11/27/13  1128 History   First MD Initiated Contact with Patient 11/27/13 1200     Chief Complaint  Patient presents with  . Head Injury     (Consider location/radiation/quality/duration/timing/severity/associated sxs/prior Treatment) HPI Comments: Patient presents to the ER for evaluation of head and neck pain after a head injury. Patient reports that she was bending over, stood up fast and hit the top of her head on a granite countertop. She was dazed, but did not get help. Patient complaining of progressively worsening headache since this occurred several hours ago. She has now noticed some pain in the right side of her neck as well. No numbness, tingling or weakness in the upper extremities.  Patient is a 53 y.o. female presenting with head injury.  Head Injury Associated symptoms: headache and neck pain     Past Medical History  Diagnosis Date  . Hypertension   . Diabetes mellitus    Past Surgical History  Procedure Laterality Date  . Abdominal hysterectomy    . Rectal fissure    . Foot surgery     No family history on file. History  Substance Use Topics  . Smoking status: Current Every Day Smoker -- 0.50 packs/day    Types: Cigarettes  . Smokeless tobacco: Not on file  . Alcohol Use: No   OB History   Grav Para Term Preterm Abortions TAB SAB Ect Mult Living                 Review of Systems  Musculoskeletal: Positive for neck pain.  Neurological: Positive for headaches.  All other systems reviewed and are negative.     Allergies  Hydrochlorothiazide and Latex  Home Medications   Prior to Admission medications   Medication Sig Start Date End Date Taking? Authorizing Provider  amLODipine (NORVASC) 5 MG tablet Take 5 mg by mouth daily.   Yes Historical Provider, MD  chlorthalidone (HYGROTON) 25 MG tablet Take 12.5 mg by mouth daily.    Yes Historical Provider, MD  Linagliptin-Metformin HCl (JENTADUETO) 2.09-998 MG  TABS Take 1 tablet by mouth daily.   Yes Historical Provider, MD  lisinopril (PRINIVIL,ZESTRIL) 40 MG tablet Take 40 mg by mouth daily.   Yes Historical Provider, MD  Multiple Vitamin (MULITIVITAMIN WITH MINERALS) TABS Take 1 tablet by mouth daily.   Yes Historical Provider, MD   BP 127/67  Pulse 96  Temp(Src) 98.4 F (36.9 C) (Oral)  Resp 16  Ht 5' (1.524 m)  Wt 134 lb (60.782 kg)  BMI 26.17 kg/m2  SpO2 99% Physical Exam  Constitutional: She is oriented to person, place, and time. She appears well-developed and well-nourished. No distress.  HENT:  Head: Normocephalic and atraumatic.  Right Ear: Hearing normal.  Left Ear: Hearing normal.  Nose: Nose normal.  Mouth/Throat: Oropharynx is clear and moist and mucous membranes are normal.  Eyes: Conjunctivae and EOM are normal. Pupils are equal, round, and reactive to light.  Neck: Normal range of motion. Neck supple. Muscular tenderness present.    Cardiovascular: Regular rhythm, S1 normal and S2 normal.  Exam reveals no gallop and no friction rub.   No murmur heard. Pulmonary/Chest: Effort normal and breath sounds normal. No respiratory distress. She exhibits no tenderness.  Abdominal: Soft. Normal appearance and bowel sounds are normal. There is no hepatosplenomegaly. There is no tenderness. There is no rebound, no guarding, no tenderness at McBurney's point and negative Murphy's sign. No hernia.  Musculoskeletal: Normal range of motion.       Cervical back: She exhibits tenderness.       Back:  Neurological: She is alert and oriented to person, place, and time. She has normal strength. No cranial nerve deficit or sensory deficit. Coordination normal. GCS eye subscore is 4. GCS verbal subscore is 5. GCS motor subscore is 6.  Skin: Skin is warm, dry and intact. No rash noted. No cyanosis.  Psychiatric: She has a normal mood and affect. Her speech is normal and behavior is normal. Thought content normal.    ED Course  Procedures  (including critical care time) Labs Review Labs Reviewed - No data to display  Imaging Review Ct Head Wo Contrast  11/27/2013   CLINICAL DATA:  Blow to the head. Dizziness. Cervical spine tenderness. Right side neck pain.  EXAM: CT HEAD WITHOUT CONTRAST  CT CERVICAL SPINE WITHOUT CONTRAST  TECHNIQUE: Multidetector CT imaging of the head and cervical spine was performed following the standard protocol without intravenous contrast. Multiplanar CT image reconstructions of the cervical spine were also generated.  COMPARISON:  Head CT scan 01/17/2010.  FINDINGS: CT HEAD FINDINGS  The brain appears normal without infarct, hemorrhage, mass lesion, mass effect, midline shift or abnormal extra-axial fluid collection. There is no hydrocephalus or pneumocephalus. The calvarium is intact. Imaged paranasal sinuses and mastoid air cells are clear.  CT CERVICAL SPINE FINDINGS  Vertebral body height and alignment are normal. There is mild loss of disc space height and small endplate spurring at G4-0. 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 Lung apices are clear.  IMPRESSION: Negative head CT.  No acute finding cervical spine.  Mild degenerative disc disease C5-6.   Electronically Signed   By: Inge Rise M.D.   On: 11/27/2013 13:25   Ct Cervical Spine Wo Contrast  11/27/2013   CLINICAL DATA:  Blow to the head. Dizziness. Cervical spine tenderness. Right side neck pain.  EXAM: CT HEAD WITHOUT CONTRAST  CT CERVICAL SPINE WITHOUT CONTRAST  TECHNIQUE: Multidetector CT imaging of the head and cervical spine was performed following the standard protocol without intravenous contrast. Multiplanar CT image reconstructions of the cervical spine were also generated.  COMPARISON:  Head CT scan 01/17/2010.  FINDINGS: CT HEAD FINDINGS  The brain appears normal without infarct, hemorrhage, mass lesion, mass effect, midline shift or abnormal extra-axial fluid collection. There is no hydrocephalus or  pneumocephalus. The calvarium is intact. Imaged paranasal sinuses and mastoid air cells are clear.  CT CERVICAL SPINE FINDINGS  Vertebral body height and alignment are normal. There is mild loss of disc space height and small endplate spurring at N0-2. 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 Lung apices are clear.  IMPRESSION: Negative head CT.  No acute finding cervical spine.  Mild degenerative disc disease C5-6.   Electronically Signed   By: Inge Rise M.D.   On: 11/27/2013 13:25     EKG Interpretation None      MDM   Final diagnoses:  None   head injury  Cervical strain  Patient presents to the ER with headache and neck pain after head injury. She had a normal neurologic exam. Pain in neck with paraspinal, but based on the mechanism, neck injury was considered. CT head and CT cervical spine performed, are negative. Patient reassured, will be treated with analgesia.    Orpah Greek, MD 11/27/13 1335

## 2013-11-27 NOTE — Discharge Instructions (Signed)
Cervical Sprain A cervical sprain is when the tissues (ligaments) that hold the neck bones in place stretch or tear. HOME CARE   Put ice on the injured area.  Put ice in a plastic bag.  Place a towel between your skin and the bag.  Leave the ice on for 15-20 minutes, 3-4 times a day.  You may have been given a collar to wear. This collar keeps your neck from moving while you heal.  Do not take the collar off unless told by your doctor.  If you have long hair, keep it outside of the collar.  Ask your doctor before changing the position of your collar. You may need to change its position over time to make it more comfortable.  If you are allowed to take off the collar for cleaning or bathing, follow your doctor's instructions on how to do it safely.  Keep your collar clean by wiping it with mild soap and water. Dry it completely. If the collar has removable pads, remove them every 1-2 days to hand wash them with soap and water. Allow them to air dry. They should be dry before you wear them in the collar.  Do not drive while wearing the collar.  Only take medicine as told by your doctor.  Keep all doctor visits as told.  Keep all physical therapy visits as told.  Adjust your work station so that you have good posture while you work.  Avoid positions and activities that make your problems worse.  Warm up and stretch before being active. GET HELP IF:  Your pain is not controlled with medicine.  You cannot take less pain medicine over time as planned.  Your activity level does not improve as expected. GET HELP RIGHT AWAY IF:   You are bleeding.  Your stomach is upset.  You have an allergic reaction to your medicine.  You develop new problems that you cannot explain.  You lose feeling (become numb) or you cannot move any part of your body (paralysis).  You have tingling or weakness in any part of your body.  Your symptoms get worse. Symptoms include:  Pain,  soreness, stiffness, puffiness (swelling), or a burning feeling in your neck.  Pain when your neck is touched.  Shoulder or upper back pain.  Limited ability to move your neck.  Headache.  Dizziness.  Your hands or arms feel week, lose feeling, or tingle.  Muscle spasms.  Difficulty swallowing or chewing. MAKE SURE YOU:   Understand these instructions.  Will watch your condition.  Will get help right away if you are not doing well or get worse. Document Released: 10/25/2007 Document Revised: 01/08/2013 Document Reviewed: 11/13/2012 [facility] Patient Information 2015 [place], [place]. This information is not intended to replace advice given to you by your health care provider. Make sure you discuss any questions you have with your health care provider.  Head Injury, Adult You have a head injury. Headaches and throwing up (vomiting) are common after a head injury. It should be easy to wake up from sleeping. Sometimes you must stay in the hospital. Most problems happen within the first 24 hours. Side effects may occur up to 7-10 days after the injury.  WHAT ARE THE TYPES OF HEAD INJURIES? Head injuries can be as minor as a bump. Some head injuries can be more severe. More severe head injuries include:  A jarring injury to the brain (concussion).  A bruise of the brain (contusion). This mean there is bleeding in  the brain that can cause swelling.  A cracked skull (skull fracture).  Bleeding in the brain that collects, clots, and forms a bump (hematoma). . WHEN SHOULD I GET HELP RIGHT AWAY?   You are confused or sleepy.  You cannot be woken up.  You feel sick to your stomach (nauseous) or keep throwing up.  Your dizziness or unsteadiness is get worse.  You have very bad, lasting headaches that are not helped by medicine.  You cannot use your arms or legs like normal  You cannot walk.  You notice changes in the black spots in the center of the colored part of your eye  (pupil).  You have clear or bloody fluid coming from your nose or ears.  You have trouble seeing. During the next 24 hours after the injury, you must stay with someone who can watch you. This person should get help right away (call 911 in the [place] if you start to shake and are not able to control it (seizures), you become pass out, or you are unable to wake up. HOW CAN I PREVENT A HEAD INJURY IN THE FUTURE?  Wear seat belts.  Wear helmets while bike riding and playing sports like football.  Stay away from dangerous activities around the house. WHEN CAN I RETURN TO NORMAL ACTIVITIES AND ATHLETICS? See your doctor before doing these activities. You should not do normal activities or play contact sports until 1 week after the following symptoms have stopped:  Headache that does not go away.  Dizziness.  Poor attention.  Confusion.  Memory problems.  Sickness to your stomach or throwing up.  Tiredness.  Fussiness.  Bothered by bright lights or loud noises.  Anxiousness or depression.  Restless sleep. MAKE SURE YOU:   Understand these instructions.  Will watch your condition.  Will get help right away if you are not doing well or get worse. Document Released: 04/20/2008 Document Revised: 02/26/2013 Document Reviewed: 01/13/2013 [facility] Patient Information 2015 [place], [place]. This information is not intended to replace advice given to you by your health care provider. Make sure you discuss any questions you have with your health care provider.

## 2013-11-27 NOTE — ED Notes (Signed)
Pt was bending over, stood up and hit head on marble counter top.  Pt denies LOC, but had brief dizziness episode.  Pt did have some tenderness over cspine area, c-collar applied at triage.

## 2014-05-12 ENCOUNTER — Encounter (HOSPITAL_BASED_OUTPATIENT_CLINIC_OR_DEPARTMENT_OTHER): Payer: Self-pay | Admitting: *Deleted

## 2014-05-12 NOTE — Progress Notes (Signed)
To come in for ekg-bmet 

## 2014-05-13 ENCOUNTER — Encounter (HOSPITAL_BASED_OUTPATIENT_CLINIC_OR_DEPARTMENT_OTHER)
Admission: RE | Admit: 2014-05-13 | Discharge: 2014-05-13 | Disposition: A | Discharge status: Home / Self Care | Payer: BC Managed Care – PPO | Source: Ambulatory Visit | Attending: Otolaryngology | Admitting: Otolaryngology

## 2014-05-13 DIAGNOSIS — Z01818 Encounter for other preprocedural examination: Secondary | ICD-10-CM | POA: Insufficient documentation

## 2014-05-13 LAB — BASIC METABOLIC PANEL
Anion gap: 7 (ref 5–15)
BUN: 18 mg/dL (ref 6–23)
CO2: 28 mmol/L (ref 19–32)
Calcium: 8.9 mg/dL (ref 8.4–10.5)
Chloride: 98 mEq/L (ref 96–112)
Creatinine, Ser: 0.66 mg/dL (ref 0.50–1.10)
GFR calc Af Amer: >90 mL/min (ref >90)
GFR calc non Af Amer: >90 mL/min (ref >90)
GLUCOSE: 99 mg/dL (ref 70–99)
POTASSIUM: 3.4 mmol/L — AB (ref 3.5–5.1)
Sodium: 133 mmol/L — ABNORMAL LOW (ref 135–145)

## 2014-05-18 ENCOUNTER — Other Ambulatory Visit: Payer: Self-pay | Admitting: Otolaryngology

## 2014-05-18 NOTE — H&P (Signed)
PREOPERATIVE H&P  Chief Complaint: hoarseness for 3 months  HPI: Hannah [name] is a 53 y.o. female who presents for evaluation of hoarseness for 3 months. She has a significant smoking history of 1 ppd since she was a teenager. On FOL in the office she has a right VC nodule or polyp. She taken to the OR for microlaryngoscopy and excisional biopsy.  Past Medical History  Diagnosis Date  . Hypertension   . Diabetes mellitus   . Wears glasses   . Wears partial dentures    Past Surgical History  Procedure Laterality Date  . Abdominal hysterectomy    . Rectal fissure    . Foot surgery  1985    left   History   Social History  . Marital Status: Divorced    Spouse Name: N/A    Number of Children: N/A  . Years of Education: N/A   Social History Main Topics  . Smoking status: Current Every Day Smoker -- 0.50 packs/day    Types: Cigarettes  . Smokeless tobacco: None  . Alcohol Use: No  . Drug Use: No  . Sexual Activity: None   Other Topics Concern  . None   Social History Narrative   History reviewed. No pertinent family history. Allergies  Allergen Reactions  . Hydrochlorothiazide Other (See Comments)    REACTION: muscle cramps  . Latex Hives   Prior to Admission medications   Medication Sig Start Date End Date Taking? Authorizing Provider  amLODipine (NORVASC) 5 MG tablet Take 5 mg by mouth daily.    Historical Provider, MD  chlorthalidone (HYGROTON) 25 MG tablet Take 12.5 mg by mouth daily.     Historical Provider, MD  ibuprofen (ADVIL,MOTRIN) 800 MG tablet Take 1 tablet (800 mg total) by mouth 3 (three) times daily. 11/27/13   Orpah Greek, MD  Linagliptin-Metformin HCl (JENTADUETO) 2.09-998 MG TABS Take 1 tablet by mouth daily.    Historical Provider, MD  lisinopril (PRINIVIL,ZESTRIL) 40 MG tablet Take 40 mg by mouth daily.    Historical Provider, MD  Multiple Vitamin (MULITIVITAMIN WITH MINERALS) TABS Take 1 tablet by mouth daily.    Historical Provider,  MD     Positive ROS: hoarsenesss  All other systems have been reviewed and were otherwise negative with the exception of those mentioned in the HPI and as above.  Physical Exam: There were no vitals filed for this visit.  General: Alert, no acute distress Oral: Normal oral mucosa and tonsils  FOL reveals an anterior right VC nodule Nasal: Clear nasal passages Neck: No palpable adenopathy or thyroid nodules Ear: Ear canal is clear with normal appearing TMs Cardiovascular: Regular rate and rhythm, no murmur.  Respiratory: Clear to auscultation Neurologic: Alert and oriented x 3   Assessment/Plan: hoarseness Plan for Procedure(s): MICROLARYNGOSCOPY WITH EXCISION OF VOCAL CORD LESIONS   Melony Overly, MD 05/18/2014 5:08 PM

## 2014-05-19 ENCOUNTER — Ambulatory Visit (HOSPITAL_BASED_OUTPATIENT_CLINIC_OR_DEPARTMENT_OTHER): Payer: BC Managed Care – PPO | Admitting: Anesthesiology

## 2014-05-19 ENCOUNTER — Encounter (HOSPITAL_BASED_OUTPATIENT_CLINIC_OR_DEPARTMENT_OTHER)
Admission: RE | Disposition: A | Discharge status: Home / Self Care | Payer: Self-pay | Source: Ambulatory Visit | Attending: Otolaryngology

## 2014-05-19 ENCOUNTER — Ambulatory Visit (HOSPITAL_BASED_OUTPATIENT_CLINIC_OR_DEPARTMENT_OTHER)
Admission: RE | Admit: 2014-05-19 | Discharge: 2014-05-19 | Disposition: A | Discharge status: Home / Self Care | Payer: BC Managed Care – PPO | Source: Ambulatory Visit | Attending: Otolaryngology | Admitting: Otolaryngology

## 2014-05-19 ENCOUNTER — Encounter (HOSPITAL_BASED_OUTPATIENT_CLINIC_OR_DEPARTMENT_OTHER): Payer: Self-pay

## 2014-05-19 DIAGNOSIS — I10 Essential (primary) hypertension: Secondary | ICD-10-CM | POA: Insufficient documentation

## 2014-05-19 DIAGNOSIS — E119 Type 2 diabetes mellitus without complications: Secondary | ICD-10-CM | POA: Insufficient documentation

## 2014-05-19 DIAGNOSIS — F17210 Nicotine dependence, cigarettes, uncomplicated: Secondary | ICD-10-CM | POA: Diagnosis not present

## 2014-05-19 DIAGNOSIS — R490 Dysphonia: Secondary | ICD-10-CM | POA: Insufficient documentation

## 2014-05-19 HISTORY — PX: MICROLARYNGOSCOPY: SHX5208

## 2014-05-19 HISTORY — DX: Presence of spectacles and contact lenses: Z97.3

## 2014-05-19 HISTORY — DX: Presence of dental prosthetic device (complete) (partial): Z97.2

## 2014-05-19 LAB — POCT HEMOGLOBIN-HEMACUE: Hemoglobin: 13.2 g/dL (ref 12.0–15.0)

## 2014-05-19 SURGERY — MICROLARYNGOSCOPY
Anesthesia: General | Site: Throat

## 2014-05-19 MED ORDER — FENTANYL CITRATE 0.05 MG/ML IJ SOLN
INTRAMUSCULAR | Status: DC | PRN
  Administered 2014-05-19: 100 ug via INTRAVENOUS

## 2014-05-19 MED ORDER — OXYCODONE HCL 5 MG/5ML PO SOLN
ORAL | Status: AC
  Filled 2014-05-19: qty 5

## 2014-05-19 MED ORDER — SUCCINYLCHOLINE CHLORIDE 20 MG/ML IJ SOLN
INTRAMUSCULAR | Status: DC | PRN
  Administered 2014-05-19: 100 mg via INTRAVENOUS

## 2014-05-19 MED ORDER — MIDAZOLAM HCL 5 MG/5ML IJ SOLN
INTRAMUSCULAR | Status: DC | PRN
  Administered 2014-05-19: 2 mg via INTRAVENOUS

## 2014-05-19 MED ORDER — ESOMEPRAZOLE MAGNESIUM 20 MG PO CPDR: 20.0000 mg | DELAYED_RELEASE_CAPSULE | Freq: Two times a day (BID) | ORAL | Status: DC

## 2014-05-19 MED ORDER — PROPOFOL 10 MG/ML IV EMUL
INTRAVENOUS | Status: AC
  Filled 2014-05-19: qty 50

## 2014-05-19 MED ORDER — MIDAZOLAM HCL 2 MG/2ML IJ SOLN
INTRAMUSCULAR | Status: AC
  Filled 2014-05-19: qty 2

## 2014-05-19 MED ORDER — FENTANYL CITRATE 0.05 MG/ML IJ SOLN: 50.0000 ug | INTRAMUSCULAR | Status: DC | PRN

## 2014-05-19 MED ORDER — EPINEPHRINE HCL 1 MG/ML IJ SOLN
INTRAMUSCULAR | Status: AC
  Filled 2014-05-19: qty 1

## 2014-05-19 MED ORDER — PROPOFOL 10 MG/ML IV BOLUS
INTRAVENOUS | Status: DC | PRN
  Administered 2014-05-19: 130 mg via INTRAVENOUS

## 2014-05-19 MED ORDER — OXYCODONE HCL 5 MG PO TABS: 5.0000 mg | ORAL_TABLET | Freq: Once | ORAL | Status: AC | PRN

## 2014-05-19 MED ORDER — LACTATED RINGERS IV SOLN
INTRAVENOUS | Status: DC
Start: 2014-05-19 — End: 2014-05-19
  Administered 2014-05-19: 09:00:00 via INTRAVENOUS

## 2014-05-19 MED ORDER — METHYLENE BLUE 1 % INJ SOLN
INTRAMUSCULAR | Status: AC
  Filled 2014-05-19: qty 10

## 2014-05-19 MED ORDER — CEFAZOLIN SODIUM-DEXTROSE 2-3 GM-% IV SOLR
INTRAVENOUS | Status: AC
  Filled 2014-05-19: qty 50

## 2014-05-19 MED ORDER — FENTANYL CITRATE 0.05 MG/ML IJ SOLN
INTRAMUSCULAR | Status: AC
  Filled 2014-05-19: qty 4

## 2014-05-19 MED ORDER — CEFAZOLIN SODIUM-DEXTROSE 2-3 GM-% IV SOLR
2.0000 g | INTRAVENOUS | Status: AC
  Administered 2014-05-19: 2 g via INTRAVENOUS

## 2014-05-19 MED ORDER — DEXAMETHASONE SODIUM PHOSPHATE 4 MG/ML IJ SOLN
INTRAMUSCULAR | Status: DC | PRN
  Administered 2014-05-19: 10 mg via INTRAVENOUS

## 2014-05-19 MED ORDER — PROMETHAZINE HCL 25 MG/ML IJ SOLN: 6.2500 mg | INTRAMUSCULAR | Status: DC | PRN

## 2014-05-19 MED ORDER — HYDROMORPHONE HCL 1 MG/ML IJ SOLN: 0.2500 mg | INTRAMUSCULAR | Status: DC | PRN

## 2014-05-19 MED ORDER — OXYCODONE HCL 5 MG/5ML PO SOLN
5.0000 mg | Freq: Once | ORAL | Status: AC | PRN
  Administered 2014-05-19: 5 mg via ORAL

## 2014-05-19 MED ORDER — PROPOFOL INFUSION 10 MG/ML OPTIME
INTRAVENOUS | Status: DC | PRN
  Administered 2014-05-19: 125 ug/kg/min via INTRAVENOUS

## 2014-05-19 MED ORDER — LIDOCAINE HCL (CARDIAC) 20 MG/ML IV SOLN
INTRAVENOUS | Status: DC | PRN
  Administered 2014-05-19: 60 mg via INTRAVENOUS

## 2014-05-19 MED ORDER — ONDANSETRON HCL 4 MG/2ML IJ SOLN
INTRAMUSCULAR | Status: DC | PRN
  Administered 2014-05-19: 4 mg via INTRAVENOUS

## 2014-05-19 MED ORDER — EPINEPHRINE HCL 1 MG/ML IJ SOLN
INTRAMUSCULAR | Status: DC | PRN
  Administered 2014-05-19: 1 mg

## 2014-05-19 MED ORDER — MIDAZOLAM HCL 2 MG/2ML IJ SOLN: 1.0000 mg | INTRAMUSCULAR | Status: DC | PRN

## 2014-05-19 SURGICAL SUPPLY — 27 items
CANISTER SUCT 1200ML W/VALVE (MISCELLANEOUS) ×2 IMPLANT
CONT SPEC 4OZ CLIKSEAL STRL BL (MISCELLANEOUS) IMPLANT
GLOVE SS BIOGEL STRL SZ 7.5 (GLOVE) ×1 IMPLANT
GLOVE SUPERSENSE BIOGEL SZ 7.5 (GLOVE)
GLOVE SURG SS PI 6.5 STRL IVOR (GLOVE) ×1 IMPLANT
GLOVE SURG SS PI 7.5 STRL IVOR (GLOVE) ×1 IMPLANT
GOWN STRL REUS W/ TWL LRG LVL3 (GOWN DISPOSABLE) IMPLANT
GOWN STRL REUS W/ TWL XL LVL3 (GOWN DISPOSABLE) IMPLANT
GOWN STRL REUS W/TWL LRG LVL3 (GOWN DISPOSABLE) ×2
GOWN STRL REUS W/TWL XL LVL3 (GOWN DISPOSABLE)
MARKER SKIN DUAL TIP RULER LAB (MISCELLANEOUS) IMPLANT
NDL SAFETY ECLIPSE 18X1.5 (NEEDLE) ×1 IMPLANT
NDL SPNL 22GX7 QUINCKE BK (NEEDLE) IMPLANT
NEEDLE HYPO 18GX1.5 SHARP (NEEDLE) ×2
NEEDLE SPNL 22GX7 QUINCKE BK (NEEDLE) IMPLANT
NS IRRIG 1000ML POUR BTL (IV SOLUTION) ×2 IMPLANT
PATTIES SURGICAL .5 X3 (DISPOSABLE) ×2 IMPLANT
SHEET MEDIUM DRAPE 40X70 STRL (DRAPES) ×2 IMPLANT
SLEEVE SCD COMPRESS KNEE MED (MISCELLANEOUS) ×2 IMPLANT
SOLUTION ANTI FOG 6CC (MISCELLANEOUS) ×1 IMPLANT
SOLUTION BUTLER CLEAR DIP (MISCELLANEOUS) ×2 IMPLANT
SPONGE GAUZE 4X4 12PLY STER LF (GAUZE/BANDAGES/DRESSINGS) ×4 IMPLANT
SURGILUBE 2OZ TUBE FLIPTOP (MISCELLANEOUS) IMPLANT
SYR 5ML LL (SYRINGE) ×2 IMPLANT
SYR CONTROL 10ML LL (SYRINGE) IMPLANT
TOWEL OR 17X24 6PK STRL BLUE (TOWEL DISPOSABLE) ×2 IMPLANT
TUBE CONNECTING 20X1/4 (TUBING) ×2 IMPLANT

## 2014-05-19 NOTE — Anesthesia Preprocedure Evaluation (Addendum)
Anesthesia Evaluation  Patient identified by MRN, date of birth, ID band Patient awake    Reviewed: Allergy & Precautions, H&P , NPO status , Patient's Chart, lab work & pertinent test results  Airway Mallampati: II  TM Distance: >3 FB Neck ROM: Full    Dental   Pulmonary Current Smoker,          Cardiovascular hypertension, Pt. on medications     Neuro/Psych negative neurological ROS  negative psych ROS   GI/Hepatic negative GI ROS, Neg liver ROS,   Endo/Other  diabetes, Type 2, Oral Hypoglycemic Agents  Renal/GU negative Renal ROS     Musculoskeletal   Abdominal   Peds  Hematology negative hematology ROS (+)   Anesthesia Other Findings   Reproductive/Obstetrics                            Anesthesia Physical Anesthesia Plan  ASA: II  Anesthesia Plan: General   Post-op Pain Management:    Induction: Intravenous  Airway Management Planned: Oral ETT  Additional Equipment:   Intra-op Plan:   Post-operative Plan: Extubation in OR  Informed Consent: I have reviewed the patients History and Physical, chart, labs and discussed the procedure including the risks, benefits and alternatives for the proposed anesthesia with the patient or authorized representative who has indicated his/her understanding and acceptance.   Dental advisory given  Plan Discussed with: CRNA  Anesthesia Plan Comments:         Anesthesia Quick Evaluation

## 2014-05-19 NOTE — Anesthesia Procedure Notes (Signed)
Procedure Name: Intubation Date/Time: 05/19/2014 10:20 AM Performed by: Maryella Shivers Pre-anesthesia Checklist: Patient identified, Emergency Drugs available, Suction available and Patient being monitored Patient Re-evaluated:Patient Re-evaluated prior to inductionOxygen Delivery Method: Circle System Utilized Preoxygenation: Pre-oxygenation with 100% oxygen Intubation Type: IV induction Ventilation: Mask ventilation without difficulty Laryngoscope Size: Mac and 3 Grade View: Grade II Tube type: Oral Tube size: 5.5 mm Number of attempts: 1 Airway Equipment and Method: stylet and oral airway Placement Confirmation: ETT inserted through vocal cords under direct vision,  positive ETCO2 and breath sounds checked- equal and bilateral Secured at: 21 cm Tube secured with: Tape Dental Injury: Teeth and Oropharynx as per pre-operative assessment

## 2014-05-19 NOTE — Op Note (Deleted)
[name], [name]              ACCOUNT NO.:  [id number]  MEDICAL RECORD NO.:  [id number]  LOCATION:                               FACILITY:  [facility]  PHYSICIAN:  Leonides Sake. Lucia Gaskins, M.D.DATE OF BIRTH:  10-22-1960  DATE OF PROCEDURE:  05/19/2014 DATE OF DISCHARGE:  05/19/2014                              OPERATIVE REPORT   PREOPERATIVE DIAGNOSIS:  Hoarseness with right vocal cord lesion.  POSTOPERATIVE DIAGNOSIS:  Hoarseness with right vocal cord lesion.  OPERATION:  Microlaryngoscopy and excisional biopsy of right vocal cord lesion.  SURGEON:  Leonides Sake. Lucia Gaskins, M.D.  ANESTHESIA:  General endotracheal.  COMPLICATIONS:  None.  BRIEF CLINICAL NOTE:  Hannah [name] is a 53 year old female who has had some voice change over last 3 months; it has gradually gotten a little bit worse.  She has a history of smoking half to one pack a day since she was a teenager.  Denies any sore throat.  On exam in the office, she has a white lesion on the anterior right vocal cord.  She was taken to the operating room at this time for microlaryngoscopy and excisional biopsy of right vocal cord lesion.  DESCRIPTION OF PROCEDURE:  After adequate endotracheal anesthesia, direct laryngoscopy was performed.  Base of tongue, vallecula, epiglottis were normal.  AE folds were clear.  Both piriform sinuses were clear.  Using the anterior scope, the vocal cords were examined. Vocal cord was suspended.  The patient had a slightly raised white lesion on the right true vocal cord.  This was grasped with cup forceps and excised with scissors.  Specimen was sent to pathology as a right vocal cord lesion.  The patient was subsequently awoken from anesthesia and transferred to recovery room, postop doing well.  DISPOSITION:  Hannah [name] was discharged home later this morning on Tylenol and Motrin p.r.n. pain and instructed on voice stress and placed on antacid therapy for 2 weeks.  We will have her follow up  in my office in 10 to 14 days for recheck and to review of final pathology.          ______________________________ Leonides Sake Lucia Gaskins, M.D.     CEN/MEDQ  D:  05/19/2014  T:  05/19/2014  Job:  [id number]  cc:   Lucianne Lei, M.D.

## 2014-05-19 NOTE — Discharge Instructions (Addendum)
Rest voice Take your regular meds    Diet as tolerated Tylenol, advil., or motrin prn pain    Use throat lozenges for sore throat Take either nexium or prilosec to reduce reflux for the next 2 weeks Call office for follow up appt in 10-14 days   [phone]   Post Anesthesia Home Care Instructions  Activity: Get plenty of rest for the remainder of the day. A responsible adult should stay with you for 24 hours following the procedure.  For the next 24 hours, DO NOT: -Drive a car -Paediatric nurse -Drink alcoholic beverages -Take any medication unless instructed by your physician -Make any legal decisions or sign important papers.  Meals: Start with liquid foods such as gelatin or soup. Progress to regular foods as tolerated. Avoid greasy, spicy, heavy foods. If nausea and/or vomiting occur, drink only clear liquids until the nausea and/or vomiting subsides. Call your physician if vomiting continues.  Special Instructions/Symptoms: Your throat may feel dry or sore from the anesthesia or the breathing tube placed in your throat during surgery. If this causes discomfort, gargle with warm salt water. The discomfort should disappear within 24 hours.

## 2014-05-19 NOTE — Anesthesia Postprocedure Evaluation (Signed)
  Anesthesia Post-op Note  Patient: Hannah [name]  Procedure(s) Performed: Procedure(s): MICROLARYNGOSCOPY WITH EXCISION OF VOCAL CORD LESION (N/A)  Patient Location: PACU  Anesthesia Type:General  Level of Consciousness: awake and alert   Airway and Oxygen Therapy: Patient Spontanous Breathing  Post-op Pain: none  Post-op Assessment: Post-op Vital signs reviewed  Post-op Vital Signs: Reviewed  Last Vitals:  Filed Vitals:   05/19/14 1130  BP: 110/63  Pulse: 78  Temp:   Resp: 13    Complications: No apparent anesthesia complications

## 2014-05-19 NOTE — Transfer of Care (Signed)
Immediate Anesthesia Transfer of Care Note  Patient: Hannah [name]  Procedure(s) Performed: Procedure(s): MICROLARYNGOSCOPY WITH EXCISION OF VOCAL CORD LESION (N/A)  Patient Location: PACU  Anesthesia Type:General  Level of Consciousness: sedated  Airway & Oxygen Therapy: Patient Spontanous Breathing and Patient connected to face mask oxygen  Post-op Assessment: Report given to PACU RN and Post -op Vital signs reviewed and stable  Post vital signs: Reviewed and stable  Complications: No apparent anesthesia complications

## 2014-05-19 NOTE — Brief Op Note (Signed)
05/19/2014  10:43 AM  PATIENT:  Hannah [name]  53 y.o. female  PRE-OPERATIVE DIAGNOSIS:  hoarseness  POST-OPERATIVE DIAGNOSIS:  * No post-op diagnosis entered *  PROCEDURE:  Procedure(s): MICROLARYNGOSCOPY WITH EXCISION OF VOCAL CORD LESIONS (N/A)  SURGEON:  Surgeon(s) and Role:    * Rozetta Nunnery, MD - Primary  PHYSICIAN ASSISTANT:   ASSISTANTS: none   ANESTHESIA:   general  EBL:  Total I/O In: 200 [I.V.:200] Out: -   BLOOD ADMINISTERED:none  DRAINS: none   LOCAL MEDICATIONS USED:  NONE  SPECIMEN:  Source of Specimen:  right vocal cord  DISPOSITION OF SPECIMEN:  PATHOLOGY  COUNTS:  YES  TOURNIQUET:  * No tourniquets in log *  DICTATION: .Other Dictation: Dictation Number [id number]  PLAN OF CARE: Discharge to home after PACU  PATIENT DISPOSITION:  PACU - hemodynamically stable.   Delay start of Pharmacological VTE agent (>24hrs) due to surgical blood loss or risk of bleeding: not applicable

## 2014-05-19 NOTE — Op Note (Signed)
[name], [name]              ACCOUNT NO.:  [id number]  MEDICAL RECORD NO.:  [id number]  LOCATION:                               FACILITY:  [facility]  PHYSICIAN:  Leonides Sake. Lucia Gaskins, M.D.DATE OF BIRTH:  Sep 23, 1960  DATE OF PROCEDURE:  05/19/2014 DATE OF DISCHARGE:  05/19/2014                              OPERATIVE REPORT   PREOPERATIVE DIAGNOSIS:  Hoarseness with right vocal cord lesion.  POSTOPERATIVE DIAGNOSIS:  Hoarseness with right vocal cord lesion.  OPERATION:  Microlaryngoscopy and excisional biopsy of right vocal cord lesion.  SURGEON:  Leonides Sake. Lucia Gaskins, M.D.  ANESTHESIA:  General endotracheal.  COMPLICATIONS:  None.  BRIEF CLINICAL NOTE:  Hannah [name] is a 53 year old female who has had some voice change over last 3 months; it has gradually gotten a little bit worse.  She has a history of smoking half to one pack a day since she was a teenager.  Denies any sore throat.  On exam in the office, she has a white lesion on the anterior right vocal cord.  She was taken to the operating room at this time for microlaryngoscopy and excisional biopsy of right vocal cord lesion.  DESCRIPTION OF PROCEDURE:  After adequate endotracheal anesthesia, direct laryngoscopy was performed.  Base of tongue, vallecula, epiglottis were normal.  AE folds were clear.  Both piriform sinuses were clear.  Using the anterior scope, the vocal cords were examined. Vocal cord was suspended.  The patient had a slightly raised white lesion on the right true vocal cord.  This was grasped with cup forceps and excised with scissors.  Specimen was sent to pathology as a right vocal cord lesion.  The patient was subsequently awoken from anesthesia and transferred to recovery room, postop doing well.  DISPOSITION:  Hannah [name] was discharged home later this morning on Tylenol and Motrin p.r.n. pain and instructed on voice stress and placed on antacid therapy for 2 weeks.  We will have her follow up  in my office in 10 to 14 days for recheck and to review of final pathology.          ______________________________ Leonides Sake Lucia Gaskins, M.D.     CEN/MEDQ  D:  05/19/2014  T:  05/19/2014  Job:  [id number]  cc:   Lucianne Lei, M.D.

## 2014-05-19 NOTE — Interval H&P Note (Signed)
History and Physical Interval Note:  05/19/2014 9:54 AM  [name]  has presented today for surgery, with the diagnosis of hoarseness  The various methods of treatment have been discussed with the patient and family. After consideration of risks, benefits and other options for treatment, the patient has consented to  Procedure(s): MICROLARYNGOSCOPY WITH EXCISION OF VOCAL CORD LESIONS (N/A) as a surgical intervention .  The patient's history has been reviewed, patient examined, no change in status, stable for surgery.  I have reviewed the patient's chart and labs.  Questions were answered to the patient's satisfaction.     [name]

## 2014-05-20 ENCOUNTER — Encounter (HOSPITAL_BASED_OUTPATIENT_CLINIC_OR_DEPARTMENT_OTHER): Payer: Self-pay | Admitting: Otolaryngology

## 2014-07-19 ENCOUNTER — Emergency Department (HOSPITAL_BASED_OUTPATIENT_CLINIC_OR_DEPARTMENT_OTHER)
Admission: EM | Admit: 2014-07-19 | Discharge: 2014-07-20 | Disposition: A | Discharge status: Home / Self Care | Payer: BLUE CROSS/BLUE SHIELD | Attending: Emergency Medicine | Admitting: Emergency Medicine

## 2014-07-19 ENCOUNTER — Emergency Department (HOSPITAL_BASED_OUTPATIENT_CLINIC_OR_DEPARTMENT_OTHER): Payer: BLUE CROSS/BLUE SHIELD

## 2014-07-19 ENCOUNTER — Encounter (HOSPITAL_BASED_OUTPATIENT_CLINIC_OR_DEPARTMENT_OTHER): Payer: Self-pay | Admitting: Emergency Medicine

## 2014-07-19 DIAGNOSIS — M25551 Pain in right hip: Secondary | ICD-10-CM | POA: Insufficient documentation

## 2014-07-19 DIAGNOSIS — I10 Essential (primary) hypertension: Secondary | ICD-10-CM | POA: Insufficient documentation

## 2014-07-19 DIAGNOSIS — R52 Pain, unspecified: Secondary | ICD-10-CM

## 2014-07-19 DIAGNOSIS — M545 Low back pain: Secondary | ICD-10-CM | POA: Diagnosis not present

## 2014-07-19 DIAGNOSIS — Z91040 Latex allergy status: Secondary | ICD-10-CM | POA: Diagnosis not present

## 2014-07-19 DIAGNOSIS — R110 Nausea: Secondary | ICD-10-CM | POA: Diagnosis not present

## 2014-07-19 DIAGNOSIS — Z79899 Other long term (current) drug therapy: Secondary | ICD-10-CM | POA: Diagnosis not present

## 2014-07-19 DIAGNOSIS — E119 Type 2 diabetes mellitus without complications: Secondary | ICD-10-CM | POA: Insufficient documentation

## 2014-07-19 DIAGNOSIS — Z720 Tobacco use: Secondary | ICD-10-CM | POA: Insufficient documentation

## 2014-07-19 DIAGNOSIS — M7918 Myalgia, other site: Secondary | ICD-10-CM

## 2014-07-19 LAB — URINE MICROSCOPIC-ADD ON

## 2014-07-19 LAB — URINALYSIS, ROUTINE W REFLEX MICROSCOPIC
BILIRUBIN URINE: NEGATIVE
Glucose, UA: NEGATIVE mg/dL
Hgb urine dipstick: NEGATIVE
Ketones, ur: 15 mg/dL — AB
Nitrite: NEGATIVE
PH: 7.5 (ref 5.0–8.0)
Protein, ur: NEGATIVE mg/dL
SPECIFIC GRAVITY, URINE: 1.023 (ref 1.005–1.030)
UROBILINOGEN UA: 1 mg/dL (ref 0.0–1.0)

## 2014-07-19 NOTE — ED Notes (Addendum)
Pt reports right hip and flank pain onset Friday past denies event or injury

## 2014-07-19 NOTE — ED Provider Notes (Signed)
CSN: [id number]     Arrival date & time 07/19/14  1953 History  This chart was scribed for No att. providers found by Edison Simon, ED Scribe. This patient was seen in room MHOTF/OTF and the patient's care was started at 9:59 PM.    Chief Complaint  Patient presents with  . Hip Pain   The history is provided by the patient. No language interpreter was used.    HPI Comments: Renalda Locklin is a 54 y.o. female who presents to the Emergency Department complaining of aching right hip pain intermittently since 4 days ago. She states pain is worse when sitting and is uncomfortable with walking but tolerable. She states pain worsened during 1.5 hour drive yesterday. 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 She reports associated chills and nausea. She notes she has had UTIs before without dysuria. She states she used pain medication at home which improved symptoms by helping her sleep. She reports prior back problems after falling on her coccyx years ago and states this feels "kinda" similar. She states she has 1 ovary. She has history of diabetes and states her blood sugar levels have been good. She denies dysuria or difficulty urinating.  Past Medical History  Diagnosis Date  . Hypertension   . Diabetes mellitus   . Wears glasses   . Wears partial dentures    Past Surgical History  Procedure Laterality Date  . Abdominal hysterectomy    . Rectal fissure    . Foot surgery  1985    left  . Microlaryngoscopy N/A 05/19/2014    Procedure: MICROLARYNGOSCOPY WITH EXCISION OF VOCAL CORD LESION;  Surgeon: Rozetta Nunnery, MD;  Location: [facility];  Service: ENT;  Laterality: N/A;   History reviewed. No pertinent family history. History  Substance Use Topics  . Smoking status: Current Every Day Smoker -- 0.50 packs/day    Types: Cigarettes  . Smokeless tobacco: Not on file  . Alcohol Use: No   OB History    No data available     Review of  Systems  Constitutional: Positive for chills.  Gastrointestinal: Positive for nausea.  Genitourinary: Negative for dysuria and difficulty urinating.  Musculoskeletal:       Right hip pain  All other systems reviewed and are negative.     Allergies  Hydrochlorothiazide and Latex  Home Medications   Prior to Admission medications   Medication Sig Start Date End Date Taking? Authorizing Provider  amLODipine (NORVASC) 5 MG tablet Take 5 mg by mouth daily.    Historical Provider, MD  chlorthalidone (HYGROTON) 25 MG tablet Take 12.5 mg by mouth daily.     Historical Provider, MD  esomeprazole (NEXIUM) 20 MG capsule Take 1 capsule (20 mg total) by mouth 2 (two) times daily before a meal. 05/19/14   Rozetta Nunnery, MD  HYDROcodone-acetaminophen (NORCO/VICODIN) 5-325 MG per tablet Take 1-2 tablets by mouth every 4 (four) hours as needed. 07/20/14   Jasper Riling. Shawntelle Ungar, MD  ibuprofen (ADVIL,MOTRIN) 800 MG tablet Take 1 tablet (800 mg total) by mouth 3 (three) times daily. 11/27/13   Orpah Greek, MD  Linagliptin-Metformin HCl (JENTADUETO) 2.09-998 MG TABS Take 1 tablet by mouth daily.    Historical Provider, MD  lisinopril (PRINIVIL,ZESTRIL) 40 MG tablet Take 40 mg by mouth daily.    Historical Provider, MD  Multiple Vitamin (MULITIVITAMIN WITH MINERALS) TABS Take 1 tablet by mouth daily.    Historical  Provider, MD   BP 120/75 mmHg  Pulse 96  Temp(Src) 98.4 F (36.9 C) (Oral)  Resp 16  Ht 5' (1.524 m)  Wt 135 lb (61.236 kg)  BMI 26.37 kg/m2  SpO2 98% Physical Exam  Constitutional: She is oriented to person, place, and time. She appears well-developed and well-nourished.  HENT:  Head: Normocephalic and atraumatic.  Eyes: Conjunctivae are normal.  Neck: Normal range of motion. Neck supple.  Cardiovascular: Normal rate, regular rhythm and normal heart sounds.   No murmur heard. Pulmonary/Chest: Effort normal and breath sounds normal. No respiratory distress. She has no  wheezes. She has no rales.  Abdominal: Soft. She exhibits no distension. There is no tenderness.  Musculoskeletal: Normal range of motion. She exhibits no edema (no peripheral edema).  Some pain on the lower SI joint to upper buttock area Pian with straight leg raise on right side Strong distal pulse  Neurological: She is alert and oriented to person, place, and time.  good distal sensation  Skin: Skin is warm and dry.  No redness or rash  Psychiatric: She has a normal mood and affect.  Nursing note and vitals reviewed.   ED Course  Procedures (including critical care time)  DIAGNOSTIC STUDIES: Oxygen Saturation is 100% on room air, normal by my interpretation.    COORDINATION OF CARE: 10:06 PM Discussed treatment plan with patient at beside, the patient agrees with the plan and has no further questions at this time.   Labs Review Labs Reviewed  URINALYSIS, ROUTINE W REFLEX MICROSCOPIC - Abnormal; Notable for the following:    Ketones, ur 15 (*)    Leukocytes, UA SMALL (*)    All other components within normal limits  URINE MICROSCOPIC-ADD ON - Abnormal; Notable for the following:    Bacteria, UA MANY (*)    All other components within normal limits  URINE CULTURE    Imaging Review Dg Hip Unilat With Pelvis 2-3 Views Right  07/19/2014   CLINICAL DATA:  Acute onset of right hip pain. Chills and nausea. Initial encounter.  EXAM: RIGHT HIP (WITH PELVIS) 2-3 VIEWS  COMPARISON:  None.  FINDINGS: There is no evidence of fracture or dislocation. Both femoral heads are seated normally within their respective acetabula. The proximal right femur appears intact. No significant degenerative change is appreciated. The sacroiliac joints are unremarkable in appearance.  The visualized bowel gas pattern is grossly unremarkable in appearance.  IMPRESSION: No evidence of fracture or dislocation.   Electronically Signed   By: Garald Balding M.D.   On: 07/19/2014 23:37     EKG  Interpretation None      MDM   Final diagnoses:  Pain  Buttock pain    Patient with pain in her right buttock. Had done some physical lifting at work. Urine had many bacteria but has not had any dysuria. No abdominal tenderness. We'll treat as musculoskeletal: Discharge home. Will follow-up with her orthopedist as needed  I personally performed the services described in this documentation, which was scribed in my presence. The recorded information has been reviewed and is accurate.    Jasper Riling. Alvino Chapel, MD 07/20/14 [phone]

## 2014-07-20 MED ORDER — HYDROCODONE-ACETAMINOPHEN 5-325 MG PO TABS: 1.0000 | ORAL_TABLET | ORAL | Status: DC | PRN

## 2014-07-20 NOTE — Discharge Instructions (Signed)
Back Pain, Adult Low back pain is very common. About 1 in 5 people have back pain.The cause of low back pain is rarely dangerous. The pain often gets better over time.About half of people with a sudden onset of back pain feel better in just 2 weeks. About 8 in 10 people feel better by 6 weeks.  CAUSES Some common causes of back pain include:  Strain of the muscles or ligaments supporting the spine.  Wear and tear (degeneration) of the spinal discs.  Arthritis.  Direct injury to the back. DIAGNOSIS Most of the time, the direct cause of low back pain is not known.However, back pain can be treated effectively even when the exact cause of the pain is unknown.Answering your caregiver's questions about your overall health and symptoms is one of the most accurate ways to make sure the cause of your pain is not dangerous. If your caregiver needs more information, he or she may order lab work or imaging tests (X-rays or MRIs).However, even if imaging tests show changes in your back, this usually does not require surgery. HOME CARE INSTRUCTIONS For many people, back pain returns.Since low back pain is rarely dangerous, it is often a condition that people can learn to manageon their own.   Remain active. It is stressful on the back to sit or stand in one place. Do not sit, drive, or stand in one place for more than 30 minutes at a time. Take short walks on level surfaces as soon as pain allows.Try to increase the length of time you walk each day.  Do not stay in bed.Resting more than 1 or 2 days can delay your recovery.  Do not avoid exercise or work.Your body is made to move.It is not dangerous to be active, even though your back may hurt.Your back will likely heal faster if you return to being active before your pain is gone.  Pay attention to your body when you bend and lift. Many people have less discomfortwhen lifting if they bend their knees, keep the load close to their bodies,and  avoid twisting. Often, the most comfortable positions are those that put less stress on your recovering back.  Find a comfortable position to sleep. Use a firm mattress and lie on your side with your knees slightly bent. If you lie on your back, put a pillow under your knees.  Only take over-the-counter or prescription medicines as directed by your caregiver. Over-the-counter medicines to reduce pain and inflammation are often the most helpful.Your caregiver may prescribe muscle relaxant drugs.These medicines help dull your pain so you can more quickly return to your normal activities and healthy exercise.  Put ice on the injured area.  Put ice in a plastic bag.  Place a towel between your skin and the bag.  Leave the ice on for 15-20 minutes, 03-04 times a day for the first 2 to 3 days. After that, ice and heat may be alternated to reduce pain and spasms.  Ask your caregiver about trying back exercises and gentle massage. This may be of some benefit.  Avoid feeling anxious or stressed.Stress increases muscle tension and can worsen back pain.It is important to recognize when you are anxious or stressed and learn ways to manage it.Exercise is a great option. SEEK MEDICAL CARE IF:  You have pain that is not relieved with rest or medicine.  You have pain that does not improve in 1 week.  You have new symptoms.  You are generally not feeling well. SEEK   IMMEDIATE MEDICAL CARE IF:   You have pain that radiates from your back into your legs.  You develop new bowel or bladder control problems.  You have unusual weakness or numbness in your arms or legs.  You develop nausea or vomiting.  You develop abdominal pain.  You feel faint. Document Released: 05/08/2005 Document Revised: 11/07/2011 Document Reviewed: 09/09/2013 Exit[employer] Patient Information 2015 [employer]. This information is not intended to replace advice given to you by your health care provider. Make sure you  discuss any questions you have with your health care provider.  

## 2014-07-21 LAB — URINE CULTURE
Colony Count: NO GROWTH
Culture: NO GROWTH

## 2014-11-04 ENCOUNTER — Ambulatory Visit: Payer: Self-pay | Admitting: General Surgery

## 2014-11-04 NOTE — H&P (Signed)
History of Present Illness Ralene Ok MD; 10/28/2014 1:44 PM) Patient words: Evaluate umbilical hernia.  The patient is a 54 year old female who presents with an umbilical hernia. 54 year old female who is referred by Dr. Criss Rosales for an evaluation of an umbilical hernia. The patient states she's had this hernia for several months. She states becoming more symptomatic with twisting. She feels that she has pain at the umbilicus due to this. Patient has had no signs or symptoms of incarceration or strangulation.   Other Problems Ivor Costa, CMA; 10/28/2014 1:29 PM) Arthritis Diabetes Mellitus High blood pressure  Past Surgical History Ivor Costa, [place]; 10/28/2014 1:29 PM) Breast Biopsy Bilateral. Colon Polyp Removal - Colonoscopy Foot Surgery Left. Hysterectomy (not due to cancer) - Partial Oral Surgery Resection of Small Bowel Resection of Stomach  Diagnostic Studies History Ivor Costa, [place]; 10/28/2014 1:29 PM) Colonoscopy 5-10 years ago Mammogram 1-3 years ago Pap Smear 1-5 years ago  Allergies Ivor Costa, CMA; 10/28/2014 1:31 PM) Hydrochlorothiazide *DIURETICS* Latex Exam Gloves *MEDICAL DEVICES AND SUPPLIES*  Medication History Ivor Costa, CMA; 10/28/2014 1:35 PM) Norvasc (5MG  Tablet, Oral) Active. Hygroton (25MG  Tablet, Oral) Active. NexIUM (20MG  Capsule DR, Oral) Active. Hydrocodone-Acetaminophen (5-325MG  Tablet, Oral) Active. Ibuprofen (800MG  Tablet, Oral) Active. Jentadueto (2.5-1000MG  Tablet, Oral) Active. Lisinopril (40MG  Tablet, Oral) Active. Multiple Vitamins-Minerals (Oral) Active.  Social History Ivor Costa, [place]; 10/28/2014 1:29 PM) Alcohol use Occasional alcohol use. Caffeine use Coffee. No drug use Tobacco use Current every day smoker.  Family History Ivor Costa, [place]; 10/28/2014 1:29 PM) Arthritis Father, Mother. Colon Polyps Mother. Diabetes Mellitus Brother, Father, Mother, Sister. Heart Disease  Father. Heart disease in female family member before age 31 Hypertension Brother, Father, Mother, Sister. Kidney Disease Father.  Pregnancy / Birth History Ivor Costa, [place]; 10/28/2014 1:29 PM) Age at menarche 83 years. Gravida 2 Irregular periods Maternal age 42-20 Para 1  Review of Systems Ivor Costa CMA; 10/28/2014 1:29 PM) General Not Present- Appetite Loss, Chills, Fatigue, Fever, Night Sweats, Weight Gain and Weight Loss. Skin Not Present- Change in Wart/Mole, Dryness, Hives, Jaundice, New Lesions, Non-Healing Wounds, Rash and Ulcer. HEENT Present- Wears glasses/contact lenses. Not Present- Earache, Hearing Loss, Hoarseness, Nose Bleed, Oral Ulcers, Ringing in the Ears, Seasonal Allergies, Sinus Pain, Sore Throat, Visual Disturbances and Yellow Eyes. Respiratory Present- Snoring. Not Present- Bloody sputum, Chronic Cough, Difficulty Breathing and Wheezing. Breast Not Present- Breast Mass, Breast Pain, Nipple Discharge and Skin Changes. Cardiovascular Present- Swelling of Extremities. Not Present- Chest Pain, Difficulty Breathing Lying Down, Leg Cramps, Palpitations, Rapid Heart Rate and Shortness of Breath. Gastrointestinal Present- Abdominal Pain, Bloating and Change in Bowel Habits. Not Present- Bloody Stool, Chronic diarrhea, Constipation, Difficulty Swallowing, Excessive gas, Gets full quickly at meals, Hemorrhoids, Indigestion, Nausea, Rectal Pain and Vomiting. Female Genitourinary Not Present- Frequency, Nocturia, Painful Urination, Pelvic Pain and Urgency. Musculoskeletal Present- Back Pain, Joint Pain and Joint Stiffness. Not Present- Muscle Pain, Muscle Weakness and Swelling of Extremities. Neurological Present- Numbness and Trouble walking. Not Present- Decreased Memory, Fainting, Headaches, Seizures, Tingling, Tremor and Weakness. Psychiatric Not Present- Anxiety, Bipolar, Change in Sleep Pattern, Depression, Fearful and Frequent crying. Endocrine Present- Hair  Changes and Hot flashes. Not Present- Cold Intolerance, Excessive Hunger, Heat Intolerance and New Diabetes. Hematology Not Present- Easy Bruising, Excessive bleeding, Gland problems, HIV and Persistent Infections.   Vitals Ivor Costa CMA; 10/28/2014 1:29 PM) 10/28/2014 1:26 PM Weight: 133.6 lb Temp.: 97.68F(Temporal)  Pulse: 80 (Regular)  Resp.: 16 (Unlabored)  BP: 128/84 (Sitting, Left Arm, Standard)  Physical Exam Ralene Ok MD; 10/28/2014 1:44 PM) General Mental Status-Alert. General Appearance-Consistent with stated age. Hydration-Well hydrated. Voice-Normal.  Head and Neck Head-normocephalic, atraumatic with no lesions or palpable masses. Trachea-midline. Thyroid Gland Characteristics - normal size and consistency.  Chest and Lung Exam Chest and lung exam reveals -quiet, even and easy respiratory effort with no use of accessory muscles and on auscultation, normal breath sounds, no adventitious sounds and normal vocal resonance. Inspection Chest Wall - Normal. Back - normal.  Cardiovascular Cardiovascular examination reveals -normal heart sounds, regular rate and rhythm with no murmurs and normal pedal pulses bilaterally.  Abdomen Inspection Skin - Scar - no surgical scars. Hernias - Umbilical hernia - Reducible. Palpation/Percussion Normal exam - Soft, Non Tender, No Rebound tenderness, No Rigidity (guarding) and No hepatosplenomegaly. Auscultation Normal exam - Bowel sounds normal.    Assessment & Plan Ralene Ok MD; 01/21/4461 8:63 PM) UMBILICAL HERNIA WITHOUT OBSTRUCTION AND WITHOUT GANGRENE (553.1  K42.9) Impression: 54 year old female with umbilical hernia.  1. Patient will like to proceed to the operating for laparoscopic umbilical hernia repair with mesh. 2. All risks and benefits were discussed with the patient to generally include, but not limited to: infection, bleeding, damage to surrounding structures, acute and  chronic nerve pain, and recurrence. Alternatives were offered and described. All questions were answered and the patient voiced understanding of the procedure and wishes to proceed at this point with hernia repair.

## 2014-11-11 ENCOUNTER — Encounter (HOSPITAL_COMMUNITY)
Admission: RE | Admit: 2014-11-11 | Discharge: 2014-11-11 | Disposition: A | Discharge status: Home / Self Care | Payer: BLUE CROSS/BLUE SHIELD | Source: Ambulatory Visit | Attending: General Surgery | Admitting: General Surgery

## 2014-11-11 ENCOUNTER — Encounter (HOSPITAL_COMMUNITY): Payer: Self-pay

## 2014-11-11 DIAGNOSIS — Z79899 Other long term (current) drug therapy: Secondary | ICD-10-CM | POA: Diagnosis not present

## 2014-11-11 DIAGNOSIS — F17200 Nicotine dependence, unspecified, uncomplicated: Secondary | ICD-10-CM | POA: Diagnosis not present

## 2014-11-11 DIAGNOSIS — M1990 Unspecified osteoarthritis, unspecified site: Secondary | ICD-10-CM | POA: Diagnosis not present

## 2014-11-11 DIAGNOSIS — K219 Gastro-esophageal reflux disease without esophagitis: Secondary | ICD-10-CM | POA: Diagnosis not present

## 2014-11-11 DIAGNOSIS — Z79891 Long term (current) use of opiate analgesic: Secondary | ICD-10-CM | POA: Diagnosis not present

## 2014-11-11 DIAGNOSIS — K429 Umbilical hernia without obstruction or gangrene: Secondary | ICD-10-CM | POA: Diagnosis present

## 2014-11-11 DIAGNOSIS — E119 Type 2 diabetes mellitus without complications: Secondary | ICD-10-CM | POA: Diagnosis not present

## 2014-11-11 DIAGNOSIS — N736 Female pelvic peritoneal adhesions (postinfective): Secondary | ICD-10-CM | POA: Diagnosis not present

## 2014-11-11 DIAGNOSIS — I10 Essential (primary) hypertension: Secondary | ICD-10-CM | POA: Diagnosis not present

## 2014-11-11 DIAGNOSIS — Z791 Long term (current) use of non-steroidal anti-inflammatories (NSAID): Secondary | ICD-10-CM | POA: Diagnosis not present

## 2014-11-11 HISTORY — DX: Unspecified osteoarthritis, unspecified site: M19.90

## 2014-11-11 HISTORY — DX: Cardiac murmur, unspecified: R01.1

## 2014-11-11 HISTORY — DX: Family history of other specified conditions: Z84.89

## 2014-11-11 HISTORY — DX: Cardiac arrhythmia, unspecified: I49.9

## 2014-11-11 LAB — CBC
HCT: 38.5 % (ref 36.0–46.0)
Hemoglobin: 12.8 g/dL (ref 12.0–15.0)
MCH: 27.5 pg (ref 26.0–34.0)
MCHC: 33.2 g/dL (ref 30.0–36.0)
MCV: 82.8 fL (ref 78.0–100.0)
Platelets: 380 10*3/uL (ref 150–400)
RBC: 4.65 MIL/uL (ref 3.87–5.11)
RDW: 13.8 % (ref 11.5–15.5)
WBC: 6.8 10*3/uL (ref 4.0–10.5)

## 2014-11-11 LAB — GLUCOSE, CAPILLARY: GLUCOSE-CAPILLARY: 100 mg/dL — AB (ref 65–99)

## 2014-11-11 LAB — BASIC METABOLIC PANEL
Anion gap: 9 (ref 5–15)
BUN: 15 mg/dL (ref 6–20)
CALCIUM: 9.7 mg/dL (ref 8.9–10.3)
CO2: 30 mmol/L (ref 22–32)
Chloride: 100 mmol/L — ABNORMAL LOW (ref 101–111)
Creatinine, Ser: 0.73 mg/dL (ref 0.44–1.00)
GFR calc Af Amer: >60 mL/min (ref >60)
Glucose, Bld: 102 mg/dL — ABNORMAL HIGH (ref 65–99)
Potassium: 3.2 mmol/L — ABNORMAL LOW (ref 3.5–5.1)
Sodium: 139 mmol/L (ref 135–145)

## 2014-11-11 NOTE — Progress Notes (Signed)
Cardiology: Denies  Medical: Lucianne Lei MD  ECHO: 2005 Epic  EKG: Dec 2015  Stress: Denies  Card Cath: Denies  CXR: Denies

## 2014-11-11 NOTE — Progress Notes (Signed)
   11/11/14 1253  OBSTRUCTIVE SLEEP APNEA  Have you ever been diagnosed with sleep apnea through a sleep study? No  Do you snore loudly (loud enough to be heard through closed doors)?  1  Do you often feel tired, fatigued, or sleepy during the daytime? 1  Has anyone observed you stop breathing during your sleep? 1  Do you have, or are you being treated for high blood pressure? 1  BMI more than 35 kg/m2? 0  Age over 55 years old? 1  Neck circumference greater than 40 cm/16 inches? 0 (13)  Gender: 0

## 2014-11-11 NOTE — Pre-Procedure Instructions (Signed)
Hannah [name]  11/11/2014      [facility] [place], [address] [place]. [place] [address] Phone: [phone] Fax: [phone]    Your procedure is scheduled on Fri, June 24 @ 8:00 AM  Report to [facility] Admitting at 6:00 AM  Call this number if you have problems the morning of surgery:  [phone]   Remember:  Do not eat food or drink liquids after midnight.  Take these medicines the morning of surgery with A SIP OF WATER:Amlodipine(Norvasc),Nexium(Esomeprazole),and Pain Pill(if needed)             Stop taking your Ibuprofen. No Goody's,BC's,Aleve,Aspirin,Fish Oil,or any Herbal Medications.   Do not wear jewelry, make-up or nail polish.  Do not wear lotions, powders, or perfumes.  You may wear deodorant.  Do not shave 48 hours prior to surgery.    Do not bring valuables to the hospital.  [facility] is not responsible for any belongings or valuables.  Contacts, dentures or bridgework may not be worn into surgery.  Leave your suitcase in the car.  After surgery it may be brought to your room.  For patients admitted to the hospital, discharge time will be determined by your treatment team.  Patients discharged the day of surgery will not be allowed to drive home.    Special instructions:   - Preparing for Surgery  Before surgery, you can play an important role.  Because skin is not sterile, your skin needs to be as free of germs as possible.  You can reduce the number of germs on you skin by washing with CHG (chlorahexidine gluconate) soap before surgery.  CHG is an antiseptic cleaner which kills germs and bonds with the skin to continue killing germs even after washing.  Please DO NOT use if you have an allergy to CHG or antibacterial soaps.  If your skin becomes reddened/irritated stop using the CHG and inform your nurse when you arrive at Short Stay.  Do not shave (including legs and  underarms) for at least 48 hours prior to the first CHG shower.  You may shave your face.  Please follow these instructions carefully:   1.  Shower with CHG Soap the night before surgery and the                                morning of Surgery.  2.  If you choose to wash your hair, wash your hair first as usual with your       normal shampoo.  3.  After you shampoo, rinse your hair and body thoroughly to remove the                      Shampoo.  4.  Use CHG as you would any other liquid soap.  You can apply chg directly       to the skin and wash gently with scrungie or a clean washcloth.  5.  Apply the CHG Soap to your body ONLY FROM THE NECK DOWN.        Do not use on open wounds or open sores.  Avoid contact with your eyes,       ears, mouth and genitals (private parts).  Wash genitals (private parts)       with your normal soap.  6.  Wash thoroughly, paying special attention to  the area where your surgery        will be performed.  7.  Thoroughly rinse your body with warm water from the neck down.  8.  DO NOT shower/wash with your normal soap after using and rinsing off       the CHG Soap.  9.  Pat yourself dry with a clean towel.            10.  Wear clean pajamas.            11.  Place clean sheets on your bed the night of your first shower and do not        sleep with pets.  Day of Surgery  Do not apply any lotions/deoderants the morning of surgery.  Please wear clean clothes to the hospital/surgery center.    Please read over the following fact sheets that you were given. Pain Booklet, Coughing and Deep Breathing and Surgical Site Infection Prevention

## 2014-11-12 LAB — HEMOGLOBIN A1C
Hgb A1c MFr Bld: 6.3 % — ABNORMAL HIGH (ref 4.8–5.6)
Mean Plasma Glucose: 134 mg/dL

## 2014-11-13 ENCOUNTER — Ambulatory Visit (HOSPITAL_COMMUNITY): Payer: BLUE CROSS/BLUE SHIELD | Admitting: Anesthesiology

## 2014-11-13 ENCOUNTER — Encounter (HOSPITAL_COMMUNITY): Payer: Self-pay | Admitting: Surgery

## 2014-11-13 ENCOUNTER — Encounter (HOSPITAL_COMMUNITY)
Admission: RE | Disposition: A | Discharge status: Home / Self Care | Payer: Self-pay | Source: Ambulatory Visit | Attending: General Surgery

## 2014-11-13 ENCOUNTER — Ambulatory Visit (HOSPITAL_COMMUNITY)
Admission: RE | Admit: 2014-11-13 | Discharge: 2014-11-13 | Disposition: A | Discharge status: Home / Self Care | Payer: BLUE CROSS/BLUE SHIELD | Source: Ambulatory Visit | Attending: General Surgery | Admitting: General Surgery

## 2014-11-13 DIAGNOSIS — M1990 Unspecified osteoarthritis, unspecified site: Secondary | ICD-10-CM | POA: Insufficient documentation

## 2014-11-13 DIAGNOSIS — E119 Type 2 diabetes mellitus without complications: Secondary | ICD-10-CM | POA: Insufficient documentation

## 2014-11-13 DIAGNOSIS — I10 Essential (primary) hypertension: Secondary | ICD-10-CM | POA: Insufficient documentation

## 2014-11-13 DIAGNOSIS — Z79891 Long term (current) use of opiate analgesic: Secondary | ICD-10-CM | POA: Insufficient documentation

## 2014-11-13 DIAGNOSIS — K429 Umbilical hernia without obstruction or gangrene: Secondary | ICD-10-CM | POA: Insufficient documentation

## 2014-11-13 DIAGNOSIS — N736 Female pelvic peritoneal adhesions (postinfective): Secondary | ICD-10-CM | POA: Insufficient documentation

## 2014-11-13 DIAGNOSIS — K219 Gastro-esophageal reflux disease without esophagitis: Secondary | ICD-10-CM | POA: Insufficient documentation

## 2014-11-13 DIAGNOSIS — F17200 Nicotine dependence, unspecified, uncomplicated: Secondary | ICD-10-CM | POA: Insufficient documentation

## 2014-11-13 DIAGNOSIS — Z79899 Other long term (current) drug therapy: Secondary | ICD-10-CM | POA: Insufficient documentation

## 2014-11-13 DIAGNOSIS — Z791 Long term (current) use of non-steroidal anti-inflammatories (NSAID): Secondary | ICD-10-CM | POA: Insufficient documentation

## 2014-11-13 HISTORY — PX: UMBILICAL HERNIA REPAIR: SHX196

## 2014-11-13 HISTORY — PX: INSERTION OF MESH: SHX5868

## 2014-11-13 LAB — GLUCOSE, CAPILLARY
Glucose-Capillary: 100 mg/dL — ABNORMAL HIGH (ref 65–99)
Glucose-Capillary: 155 mg/dL — ABNORMAL HIGH (ref 65–99)

## 2014-11-13 SURGERY — REPAIR, HERNIA, UMBILICAL, LAPAROSCOPIC
Anesthesia: General | Site: Abdomen

## 2014-11-13 MED ORDER — PROPOFOL 10 MG/ML IV BOLUS
INTRAVENOUS | Status: DC | PRN
  Administered 2014-11-13: 150 mg via INTRAVENOUS

## 2014-11-13 MED ORDER — OXYCODONE HCL 5 MG PO TABS
ORAL_TABLET | ORAL | Status: AC
  Filled 2014-11-13: qty 1

## 2014-11-13 MED ORDER — ONDANSETRON HCL 4 MG/2ML IJ SOLN
INTRAMUSCULAR | Status: DC | PRN
  Administered 2014-11-13: 4 mg via INTRAVENOUS

## 2014-11-13 MED ORDER — OXYCODONE HCL 5 MG PO TABS
5.0000 mg | ORAL_TABLET | Freq: Once | ORAL | Status: AC | PRN
  Administered 2014-11-13: 5 mg via ORAL

## 2014-11-13 MED ORDER — OXYCODONE HCL 5 MG/5ML PO SOLN: 5.0000 mg | Freq: Once | ORAL | Status: AC | PRN

## 2014-11-13 MED ORDER — SODIUM CHLORIDE 0.9 % IV SOLN: 250.0000 mL | INTRAVENOUS | Status: DC | PRN

## 2014-11-13 MED ORDER — ACETAMINOPHEN 325 MG PO TABS: 650.0000 mg | ORAL_TABLET | ORAL | Status: DC | PRN

## 2014-11-13 MED ORDER — GLYCOPYRROLATE 0.2 MG/ML IJ SOLN
INTRAMUSCULAR | Status: DC | PRN
  Administered 2014-11-13: 0.4 mg via INTRAVENOUS

## 2014-11-13 MED ORDER — SODIUM CHLORIDE 0.9 % IJ SOLN: 3.0000 mL | Freq: Two times a day (BID) | INTRAMUSCULAR | Status: DC

## 2014-11-13 MED ORDER — MIDAZOLAM HCL 5 MG/5ML IJ SOLN
INTRAMUSCULAR | Status: DC | PRN
  Administered 2014-11-13: 2 mg via INTRAVENOUS

## 2014-11-13 MED ORDER — SODIUM CHLORIDE 0.9 % IJ SOLN
3.0000 mL | INTRAMUSCULAR | Status: DC | PRN
Start: 2014-11-13 — End: 2014-11-13

## 2014-11-13 MED ORDER — CEFAZOLIN SODIUM-DEXTROSE 2-3 GM-% IV SOLR
2.0000 g | INTRAVENOUS | Status: AC
  Administered 2014-11-13: 2 g via INTRAVENOUS

## 2014-11-13 MED ORDER — LIDOCAINE HCL (CARDIAC) 20 MG/ML IV SOLN
INTRAVENOUS | Status: AC
  Filled 2014-11-13: qty 5

## 2014-11-13 MED ORDER — BUPIVACAINE HCL (PF) 0.25 % IJ SOLN
INTRAMUSCULAR | Status: AC
  Filled 2014-11-13: qty 30

## 2014-11-13 MED ORDER — FENTANYL CITRATE (PF) 250 MCG/5ML IJ SOLN
INTRAMUSCULAR | Status: AC
  Filled 2014-11-13: qty 5

## 2014-11-13 MED ORDER — GLYCOPYRROLATE 0.2 MG/ML IJ SOLN
INTRAMUSCULAR | Status: AC
  Filled 2014-11-13: qty 2

## 2014-11-13 MED ORDER — OXYCODONE-ACETAMINOPHEN 5-325 MG PO TABS: 1.0000 | ORAL_TABLET | ORAL | Status: DC | PRN

## 2014-11-13 MED ORDER — PROPOFOL 10 MG/ML IV BOLUS
INTRAVENOUS | Status: AC
  Filled 2014-11-13: qty 20

## 2014-11-13 MED ORDER — FENTANYL CITRATE (PF) 100 MCG/2ML IJ SOLN
INTRAMUSCULAR | Status: DC | PRN
Start: 2014-11-13 — End: 2014-11-13
  Administered 2014-11-13: 150 ug via INTRAVENOUS
  Administered 2014-11-13: 100 ug via INTRAVENOUS

## 2014-11-13 MED ORDER — CHLORHEXIDINE GLUCONATE 4 % EX LIQD: 1.0000 "application " | Freq: Once | CUTANEOUS | Status: DC

## 2014-11-13 MED ORDER — PROMETHAZINE HCL 25 MG/ML IJ SOLN: 6.2500 mg | INTRAMUSCULAR | Status: DC | PRN

## 2014-11-13 MED ORDER — ACETAMINOPHEN 650 MG RE SUPP: 650.0000 mg | RECTAL | Status: DC | PRN

## 2014-11-13 MED ORDER — HYDROMORPHONE HCL 1 MG/ML IJ SOLN
INTRAMUSCULAR | Status: AC
  Filled 2014-11-13: qty 1

## 2014-11-13 MED ORDER — BUPIVACAINE HCL 0.25 % IJ SOLN
INTRAMUSCULAR | Status: DC | PRN
  Administered 2014-11-13: 30 mL

## 2014-11-13 MED ORDER — NEOSTIGMINE METHYLSULFATE 10 MG/10ML IV SOLN
INTRAVENOUS | Status: AC
  Filled 2014-11-13: qty 1

## 2014-11-13 MED ORDER — MIDAZOLAM HCL 2 MG/2ML IJ SOLN
INTRAMUSCULAR | Status: AC
  Filled 2014-11-13: qty 2

## 2014-11-13 MED ORDER — HYDROMORPHONE HCL 1 MG/ML IJ SOLN
0.2500 mg | INTRAMUSCULAR | Status: DC | PRN
  Administered 2014-11-13 (×4): 0.5 mg via INTRAVENOUS

## 2014-11-13 MED ORDER — ROCURONIUM BROMIDE 50 MG/5ML IV SOLN
INTRAVENOUS | Status: AC
  Filled 2014-11-13: qty 1

## 2014-11-13 MED ORDER — NEOSTIGMINE METHYLSULFATE 10 MG/10ML IV SOLN
INTRAVENOUS | Status: DC | PRN
Start: 2014-11-13 — End: 2014-11-13
  Administered 2014-11-13: 3 mg via INTRAVENOUS

## 2014-11-13 MED ORDER — LACTATED RINGERS IV SOLN
INTRAVENOUS | Status: DC | PRN
  Administered 2014-11-13: 07:00:00 via INTRAVENOUS

## 2014-11-13 MED ORDER — 0.9 % SODIUM CHLORIDE (POUR BTL) OPTIME
TOPICAL | Status: DC | PRN
  Administered 2014-11-13: 1000 mL

## 2014-11-13 MED ORDER — OXYCODONE HCL 5 MG PO TABS: 5.0000 mg | ORAL_TABLET | ORAL | Status: DC | PRN

## 2014-11-13 MED ORDER — ONDANSETRON HCL 4 MG/2ML IJ SOLN
INTRAMUSCULAR | Status: AC
  Filled 2014-11-13: qty 2

## 2014-11-13 MED ORDER — LIDOCAINE HCL (CARDIAC) 20 MG/ML IV SOLN
INTRAVENOUS | Status: DC | PRN
  Administered 2014-11-13: 60 mg via INTRAVENOUS

## 2014-11-13 SURGICAL SUPPLY — 39 items
APL SKNCLS STERI-STRIP NONHPOA (GAUZE/BANDAGES/DRESSINGS) ×1
APPLIER CLIP ROT 10 11.4 M/L (STAPLE)
APR CLP MED LRG 11.4X10 (STAPLE)
BENZOIN TINCTURE PRP APPL 2/3 (GAUZE/BANDAGES/DRESSINGS) ×2 IMPLANT
CANISTER SUCTION 2500CC (MISCELLANEOUS) ×1 IMPLANT
CHLORAPREP W/TINT 26ML (MISCELLANEOUS) ×2 IMPLANT
CLIP APPLIE ROT 10 11.4 M/L (STAPLE) IMPLANT
CLSR STERI-STRIP ANTIMIC 1/2X4 (GAUZE/BANDAGES/DRESSINGS) ×1 IMPLANT
COVER SURGICAL LIGHT HANDLE (MISCELLANEOUS) ×2 IMPLANT
DEVICE SECURE STRAP 25 ABSORB (INSTRUMENTS) ×2 IMPLANT
DEVICE TROCAR PUNCTURE CLOSURE (ENDOMECHANICALS) ×2 IMPLANT
DRAPE LAPAROSCOPIC ABDOMINAL (DRAPES) ×2 IMPLANT
ELECT REM PT RETURN 9FT ADLT (ELECTROSURGICAL) ×2
ELECTRODE REM PT RTRN 9FT ADLT (ELECTROSURGICAL) ×1 IMPLANT
GAUZE SPONGE 4X4 12PLY STRL (GAUZE/BANDAGES/DRESSINGS) ×2 IMPLANT
GLOVE BIO SURGEON STRL SZ7.5 (GLOVE) ×2 IMPLANT
GOWN STRL REUS W/ TWL LRG LVL3 (GOWN DISPOSABLE) ×2 IMPLANT
GOWN STRL REUS W/ TWL XL LVL3 (GOWN DISPOSABLE) ×1 IMPLANT
GOWN STRL REUS W/TWL LRG LVL3 (GOWN DISPOSABLE) ×4
GOWN STRL REUS W/TWL XL LVL3 (GOWN DISPOSABLE) ×2
KIT BASIN OR (CUSTOM PROCEDURE TRAY) ×2 IMPLANT
KIT ROOM TURNOVER OR (KITS) ×2 IMPLANT
MARKER SKIN DUAL TIP RULER LAB (MISCELLANEOUS) ×2 IMPLANT
MESH PARIETEX 4.7 (Mesh General) ×1 IMPLANT
NDL INSUFFLATION 14GA 120MM (NEEDLE) ×1 IMPLANT
NEEDLE INSUFFLATION 14GA 120MM (NEEDLE) ×2 IMPLANT
NS IRRIG 1000ML POUR BTL (IV SOLUTION) ×2 IMPLANT
PAD ARMBOARD 7.5X6 YLW CONV (MISCELLANEOUS) ×4 IMPLANT
SCISSORS LAP 5X35 DISP (ENDOMECHANICALS) ×2 IMPLANT
SLEEVE ENDOPATH XCEL 5M (ENDOMECHANICALS) ×3 IMPLANT
SUT CHROMIC 2 0 SH (SUTURE) ×2 IMPLANT
SUT MNCRL AB 4-0 PS2 18 (SUTURE) ×2 IMPLANT
SUT SILK 2 0 SH (SUTURE) ×1 IMPLANT
TOWEL OR 17X26 10 PK STRL BLUE (TOWEL DISPOSABLE) ×2 IMPLANT
TRAY LAPAROSCOPIC (CUSTOM PROCEDURE TRAY) ×2 IMPLANT
TROCAR XCEL BLUNT TIP 100MML (ENDOMECHANICALS) IMPLANT
TROCAR XCEL NON-BLD 11X100MML (ENDOMECHANICALS) IMPLANT
TROCAR XCEL NON-BLD 5MMX100MML (ENDOMECHANICALS) ×2 IMPLANT
TUBING INSUFFLATION (TUBING) ×2 IMPLANT

## 2014-11-13 NOTE — Anesthesia Preprocedure Evaluation (Addendum)
Anesthesia Evaluation  Patient identified by MRN, date of birth, ID band Patient awake    Reviewed: Allergy & Precautions, NPO status , Patient's Chart, lab work & pertinent test results  Airway Mallampati: II  TM Distance: >3 FB Neck ROM: Full    Dental   Pulmonary Current Smoker,  breath sounds clear to auscultation        Cardiovascular hypertension, Pt. on medications Rhythm:Regular Rate:Normal     Neuro/Psych negative neurological ROS     GI/Hepatic Neg liver ROS, GERD-  Medicated,  Endo/Other  diabetes, Type 2, Oral Hypoglycemic Agents  Renal/GU negative Renal ROS     Musculoskeletal  (+) Arthritis -,   Abdominal   Peds  Hematology negative hematology ROS (+)   Anesthesia Other Findings   Reproductive/Obstetrics                            Anesthesia Physical Anesthesia Plan  ASA: II  Anesthesia Plan: General   Post-op Pain Management:    Induction: Intravenous  Airway Management Planned: Oral ETT  Additional Equipment:   Intra-op Plan:   Post-operative Plan: Extubation in OR  Informed Consent: I have reviewed the patients History and Physical, chart, labs and discussed the procedure including the risks, benefits and alternatives for the proposed anesthesia with the patient or authorized representative who has indicated his/her understanding and acceptance.   Dental advisory given  Plan Discussed with: CRNA  Anesthesia Plan Comments:         Anesthesia Quick Evaluation

## 2014-11-13 NOTE — H&P (View-Only) (Signed)
History of Present Illness Ralene Ok MD; 10/28/2014 1:44 PM) Patient words: Evaluate umbilical hernia.  The patient is a 54 year old female who presents with an umbilical hernia. 54 year old female who is referred by Dr. Criss Rosales for an evaluation of an umbilical hernia. The patient states she's had this hernia for several months. She states becoming more symptomatic with twisting. She feels that she has pain at the umbilicus due to this. Patient has had no signs or symptoms of incarceration or strangulation.   Other Problems Ivor Costa, CMA; 10/28/2014 1:29 PM) Arthritis Diabetes Mellitus High blood pressure  Past Surgical History Ivor Costa, [place]; 10/28/2014 1:29 PM) Breast Biopsy Bilateral. Colon Polyp Removal - Colonoscopy Foot Surgery Left. Hysterectomy (not due to cancer) - Partial Oral Surgery Resection of Small Bowel Resection of Stomach  Diagnostic Studies History Ivor Costa, [place]; 10/28/2014 1:29 PM) Colonoscopy 5-10 years ago Mammogram 1-3 years ago Pap Smear 1-5 years ago  Allergies Ivor Costa, CMA; 10/28/2014 1:31 PM) Hydrochlorothiazide *DIURETICS* Latex Exam Gloves *MEDICAL DEVICES AND SUPPLIES*  Medication History Ivor Costa, CMA; 10/28/2014 1:35 PM) Norvasc (5MG  Tablet, Oral) Active. Hygroton (25MG  Tablet, Oral) Active. NexIUM (20MG  Capsule DR, Oral) Active. Hydrocodone-Acetaminophen (5-325MG  Tablet, Oral) Active. Ibuprofen (800MG  Tablet, Oral) Active. Jentadueto (2.5-1000MG  Tablet, Oral) Active. Lisinopril (40MG  Tablet, Oral) Active. Multiple Vitamins-Minerals (Oral) Active.  Social History Ivor Costa, [place]; 10/28/2014 1:29 PM) Alcohol use Occasional alcohol use. Caffeine use Coffee. No drug use Tobacco use Current every day smoker.  Family History Ivor Costa, [place]; 10/28/2014 1:29 PM) Arthritis Father, Mother. Colon Polyps Mother. Diabetes Mellitus Brother, Father, Mother, Sister. Heart Disease  Father. Heart disease in female family member before age 31 Hypertension Brother, Father, Mother, Sister. Kidney Disease Father.  Pregnancy / Birth History Ivor Costa, [place]; 10/28/2014 1:29 PM) Age at menarche 49 years. Gravida 2 Irregular periods Maternal age 56-20 Para 1  Review of Systems Ivor Costa CMA; 10/28/2014 1:29 PM) General Not Present- Appetite Loss, Chills, Fatigue, Fever, Night Sweats, Weight Gain and Weight Loss. Skin Not Present- Change in Wart/Mole, Dryness, Hives, Jaundice, New Lesions, Non-Healing Wounds, Rash and Ulcer. HEENT Present- Wears glasses/contact lenses. Not Present- Earache, Hearing Loss, Hoarseness, Nose Bleed, Oral Ulcers, Ringing in the Ears, Seasonal Allergies, Sinus Pain, Sore Throat, Visual Disturbances and Yellow Eyes. Respiratory Present- Snoring. Not Present- Bloody sputum, Chronic Cough, Difficulty Breathing and Wheezing. Breast Not Present- Breast Mass, Breast Pain, Nipple Discharge and Skin Changes. Cardiovascular Present- Swelling of Extremities. Not Present- Chest Pain, Difficulty Breathing Lying Down, Leg Cramps, Palpitations, Rapid Heart Rate and Shortness of Breath. Gastrointestinal Present- Abdominal Pain, Bloating and Change in Bowel Habits. Not Present- Bloody Stool, Chronic diarrhea, Constipation, Difficulty Swallowing, Excessive gas, Gets full quickly at meals, Hemorrhoids, Indigestion, Nausea, Rectal Pain and Vomiting. Female Genitourinary Not Present- Frequency, Nocturia, Painful Urination, Pelvic Pain and Urgency. Musculoskeletal Present- Back Pain, Joint Pain and Joint Stiffness. Not Present- Muscle Pain, Muscle Weakness and Swelling of Extremities. Neurological Present- Numbness and Trouble walking. Not Present- Decreased Memory, Fainting, Headaches, Seizures, Tingling, Tremor and Weakness. Psychiatric Not Present- Anxiety, Bipolar, Change in Sleep Pattern, Depression, Fearful and Frequent crying. Endocrine Present- Hair  Changes and Hot flashes. Not Present- Cold Intolerance, Excessive Hunger, Heat Intolerance and New Diabetes. Hematology Not Present- Easy Bruising, Excessive bleeding, Gland problems, HIV and Persistent Infections.   Vitals Ivor Costa CMA; 10/28/2014 1:29 PM) 10/28/2014 1:26 PM Weight: 133.6 lb Temp.: 97.11F(Temporal)  Pulse: 80 (Regular)  Resp.: 16 (Unlabored)  BP: 128/84 (Sitting, Left Arm, Standard)  Physical Exam Ralene Ok MD; 10/28/2014 1:44 PM) General Mental Status-Alert. General Appearance-Consistent with stated age. Hydration-Well hydrated. Voice-Normal.  Head and Neck Head-normocephalic, atraumatic with no lesions or palpable masses. Trachea-midline. Thyroid Gland Characteristics - normal size and consistency.  Chest and Lung Exam Chest and lung exam reveals -quiet, even and easy respiratory effort with no use of accessory muscles and on auscultation, normal breath sounds, no adventitious sounds and normal vocal resonance. Inspection Chest Wall - Normal. Back - normal.  Cardiovascular Cardiovascular examination reveals -normal heart sounds, regular rate and rhythm with no murmurs and normal pedal pulses bilaterally.  Abdomen Inspection Skin - Scar - no surgical scars. Hernias - Umbilical hernia - Reducible. Palpation/Percussion Normal exam - Soft, Non Tender, No Rebound tenderness, No Rigidity (guarding) and No hepatosplenomegaly. Auscultation Normal exam - Bowel sounds normal.    Assessment & Plan Ralene Ok MD; 01/26/7892 8:10 PM) UMBILICAL HERNIA WITHOUT OBSTRUCTION AND WITHOUT GANGRENE (553.1  K42.9) Impression: 54 year old female with umbilical hernia.  1. Patient will like to proceed to the operating for laparoscopic umbilical hernia repair with mesh. 2. All risks and benefits were discussed with the patient to generally include, but not limited to: infection, bleeding, damage to surrounding structures, acute and  chronic nerve pain, and recurrence. Alternatives were offered and described. All questions were answered and the patient voiced understanding of the procedure and wishes to proceed at this point with hernia repair.

## 2014-11-13 NOTE — Transfer of Care (Signed)
Immediate Anesthesia Transfer of Care Note  Patient: Hannah [name]  Procedure(s) Performed: Procedure(s): LAPAROSCOPIC UMBILICAL HERNIA REPAIR WITH MESH (N/A) INSERTION OF MESH (N/A)  Patient Location: PACU  Anesthesia Type:General  Level of Consciousness: awake and alert   Airway & Oxygen Therapy: Patient Spontanous Breathing and Patient connected to nasal cannula oxygen  Post-op Assessment: Report given to RN and Post -op Vital signs reviewed and stable  Post vital signs: Reviewed and stable  Last Vitals:  Filed Vitals:   11/13/14 0625  BP: 99/66  Pulse: 69  Temp: 36.4 C  Resp: 18    Complications: No apparent anesthesia complications

## 2014-11-13 NOTE — Discharge Instructions (Signed)
[facility] _______[facility] ° °UMBILICAL HERNIA REPAIR: POST OP INSTRUCTIONS ° °Always review your discharge instruction sheet given to you by the facility where your surgery was performed. °IF YOU HAVE DISABILITY OR FAMILY LEAVE FORMS, YOU MUST BRING THEM TO THE OFFICE FOR PROCESSING.   °DO NOT GIVE THEM TO YOUR DOCTOR. ° °1. A  prescription for pain medication may be given to you upon discharge.  Take your pain medication as prescribed, if needed.  If narcotic pain medicine is not needed, then you may take acetaminophen (Tylenol) or ibuprofen (Advil) as needed. °2. Take your usually prescribed medications unless otherwise directed. °3. If you need a refill on your pain medication, please contact your pharmacy.  They will contact our office to request authorization. Prescriptions will not be filled after 5 pm or on week-ends. °4. You should follow a light diet the first 24 hours after arrival home, such as soup and crackers, etc.  Be sure to include lots of fluids daily.  Resume your normal diet the day after surgery. °5. Most patients will experience some swelling and bruising around the umbilicus or in the groin and scrotum.  Ice packs and reclining will help.  Swelling and bruising can take several days to resolve.  °6. It is common to experience some constipation if taking pain medication after surgery.  Increasing fluid intake and taking a stool softener (such as Colace) will usually help or prevent this problem from occurring.  A mild laxative (Milk of Magnesia or Miralax) should be taken according to package directions if there are no bowel movements after 48 hours. °7. Unless discharge instructions indicate otherwise, you may remove your bandages 24-48 hours after surgery, and you may shower at that time.  You may have steri-strips (small skin tapes) in place directly over the incision.  These strips should be left on the skin for 7-10 days.  If your surgeon used skin glue on the incision, you  may shower in 24 hours.  The glue will flake off over the next 2-3 weeks.  Any sutures or staples will be removed at the office during your follow-up visit. °8. ACTIVITIES:  You may resume regular (light) daily activities beginning the next day--such as daily self-care, walking, climbing stairs--gradually increasing activities as tolerated.  You may have sexual intercourse when it is comfortable.  Refrain from any heavy lifting or straining until approved by your doctor. °a. You may drive when you are no longer taking prescription pain medication, you can comfortably wear a seatbelt, and you can safely maneuver your car and apply brakes. °b. RETURN TO WORK:  __________________________________________________________ °9. You should see your doctor in the office for a follow-up appointment approximately 2-3 weeks after your surgery.  Make sure that you call for this appointment within a day or two after you arrive home to insure a convenient appointment time. °10. OTHER INSTRUCTIONS:  __________________________________________________________________________________________________________________________________________________________________________________________  °WHEN TO CALL YOUR DOCTOR: °1. Fever over 101.0 °2. Inability to urinate °3. Nausea and/or vomiting °4. Extreme swelling or bruising °5. Continued bleeding from incision. °6. Increased pain, redness, or drainage from the incision ° °The clinic staff is available to answer your questions during regular business hours.  Please don’t hesitate to call and ask to speak to one of the nurses for clinical concerns.  If you have a medical emergency, go to the nearest emergency room or call 911.  A surgeon from [facility] is always on call at the hospital ° ° °[address], [address], [place], [place]  [address] ? ° [address] [address], [place], [place]   [address] °[phone] ? [phone] ? FAX [phone] °Web site:  [email or web address] ° °

## 2014-11-13 NOTE — Anesthesia Procedure Notes (Signed)
Procedure Name: Intubation Date/Time: 11/13/2014 8:10 AM Performed by: Manus Gunning, Gracyn Santillanes J Pre-anesthesia Checklist: Patient identified, Timeout performed, Emergency Drugs available, Suction available and Patient being monitored Patient Re-evaluated:Patient Re-evaluated prior to inductionOxygen Delivery Method: Circle system utilized Preoxygenation: Pre-oxygenation with 100% oxygen Intubation Type: IV induction Ventilation: Mask ventilation without difficulty Laryngoscope Size: Mac and 3 Grade View: Grade I Tube type: Oral Number of attempts: 1 Placement Confirmation: ETT inserted through vocal cords under direct vision,  breath sounds checked- equal and bilateral and positive ETCO2 Secured at: 7 cm Tube secured with: Tape Dental Injury: Teeth and Oropharynx as per pre-operative assessment

## 2014-11-13 NOTE — Op Note (Signed)
11/13/2014  8:55 AM  PATIENT:  Hannah [name]  54 y.o. female  PRE-OPERATIVE DIAGNOSIS:  UMBILICAL HERNIA  POST-OPERATIVE DIAGNOSIS:  UMBILICAL HERNIA  PROCEDURE:  Procedure(s): LAPAROSCOPIC UMBILICAL HERNIA REPAIR WITH MESH (N/A) INSERTION OF MESH (N/A)  SURGEON:  Surgeon(s) and Role:    * Ralene Ok, MD - Primary  ANESTHESIA:   local and general  EBL:   5cc  BLOOD ADMINISTERED:none  DRAINS: none   LOCAL MEDICATIONS USED:  BUPIVICAINE   SPECIMEN:  No Specimen  DISPOSITION OF SPECIMEN:  N/A  COUNTS:  YES  TOURNIQUET:  * No tourniquets in log *  DICTATION: .Dragon Dictation  Details of the procedure:   After the patient was consented patient was taken back to the operating room patient was then placed in supine position bilateral SCDs in place.  The patient was prepped and draped in the usual sterile fashion. After antibiotics were confirmed a timeout was called and all facts were verified. The Veress needle technique was used to insuflate the abdomen at Palmer's point. The abdomen was insufflated to 14 mm mercury. Subsequently a 5 mm trocar was placed a camera inserted there was no injury to any intra-abdominal organs.    A second camera port was in placed into the left lower quadrant.She had adhesions of omentum to the lower midline abdominal wall.  There was also a loop of small bowel adherent to the abdominal wall.  This was taken down sharply with no electrocautery.  There was seen to be a small 3.2IZ umbilical hernia.     At this the Falicform ligament was taken down with Bovie cautery maintaining hemostasis. A 65mm port was placed in the epigastrium .   Once the hernia was cleared away, a Parietex PCO 12cm mesh  was inserted into the abdomen.  The mesh was secured circumferentially with am Securestrap tacker in a double crown fashion.    I visualized the bowel that was taken down from the abdominal wall.  There appeared to be a serosal defect.  I decided to  oversew this with a 2-0 silk in a lembert fashion which approximated the edges.  The omentum was brought over the area of the mesh. The pneumoperitoneum was evacuated  & all trocars  were removed. The skin was reapproximated with 4-0  Monocryl sutures in a subcuticular fashion. The skin was dressed with Steri-Strips tape and gauze.  The patient was taken to the recovery room in stable condition.   PLAN OF CARE: Discharge to home after PACU  PATIENT DISPOSITION:  PACU - hemodynamically stable.   Delay start of Pharmacological VTE agent (>24hrs) due to surgical blood loss or risk of bleeding: not applicable

## 2014-11-13 NOTE — Interval H&P Note (Signed)
History and Physical Interval Note:  11/13/2014 7:56 AM  Hannah [name]  has presented today for surgery, with the diagnosis of UMBILICAL HERNIA  The various methods of treatment have been discussed with the patient and family. After consideration of risks, benefits and other options for treatment, the patient has consented to  Procedure(s): [place] (N/A) INSERTION OF MESH (N/A) as a surgical intervention .  The patient's history has been reviewed, patient examined, no change in status, stable for surgery.  I have reviewed the patient's chart and labs.  Questions were answered to the patient's satisfaction.     Rosario Jacks., Anne Hahn

## 2014-11-13 NOTE — Addendum Note (Signed)
Addendum  created 11/13/14 1319 by Purvis Kilts, CRNA   Modules edited: Anesthesia Medication Administration

## 2014-11-13 NOTE — Anesthesia Postprocedure Evaluation (Signed)
  Anesthesia Post-op Note  Patient: Hannah [name]  Procedure(s) Performed: Procedure(s): LAPAROSCOPIC UMBILICAL HERNIA REPAIR WITH MESH (N/A) INSERTION OF MESH (N/A)  Patient Location: PACU  Anesthesia Type:General  Level of Consciousness: awake, alert  and oriented  Airway and Oxygen Therapy: Patient Spontanous Breathing  Post-op Pain: mild  Post-op Assessment: Post-op Vital signs reviewed              Post-op Vital Signs: Reviewed  Last Vitals:  Filed Vitals:   11/13/14 1125  BP: 115/68  Pulse: 64  Temp: 36.7 C  Resp: 14    Complications: No apparent anesthesia complications

## 2014-11-16 ENCOUNTER — Encounter (HOSPITAL_COMMUNITY): Payer: Self-pay | Admitting: General Surgery

## 2015-06-30 ENCOUNTER — Other Ambulatory Visit: Payer: Self-pay | Admitting: Family Medicine

## 2015-06-30 ENCOUNTER — Ambulatory Visit
Admission: RE | Admit: 2015-06-30 | Discharge: 2015-06-30 | Disposition: A | Discharge status: Home / Self Care | Payer: BLUE CROSS/BLUE SHIELD | Source: Ambulatory Visit | Attending: Family Medicine | Admitting: Family Medicine

## 2015-06-30 DIAGNOSIS — M461 Sacroiliitis, not elsewhere classified: Secondary | ICD-10-CM

## 2016-12-25 ENCOUNTER — Encounter (HOSPITAL_COMMUNITY): Payer: Self-pay | Admitting: Emergency Medicine

## 2016-12-25 ENCOUNTER — Observation Stay (HOSPITAL_COMMUNITY): Payer: Self-pay | Admitting: Anesthesiology

## 2016-12-25 ENCOUNTER — Observation Stay (HOSPITAL_COMMUNITY)
Admission: EM | Admit: 2016-12-25 | Discharge: 2016-12-26 | Disposition: A | Discharge status: Home / Self Care | Payer: Self-pay | Attending: General Surgery | Admitting: General Surgery

## 2016-12-25 ENCOUNTER — Observation Stay (HOSPITAL_COMMUNITY): Payer: Self-pay

## 2016-12-25 ENCOUNTER — Encounter (HOSPITAL_COMMUNITY)
Admission: EM | Disposition: A | Discharge status: Home / Self Care | Payer: Self-pay | Source: Home / Self Care | Attending: Emergency Medicine

## 2016-12-25 ENCOUNTER — Emergency Department (HOSPITAL_COMMUNITY): Payer: Self-pay

## 2016-12-25 DIAGNOSIS — R011 Cardiac murmur, unspecified: Secondary | ICD-10-CM | POA: Insufficient documentation

## 2016-12-25 DIAGNOSIS — Z885 Allergy status to narcotic agent status: Secondary | ICD-10-CM | POA: Insufficient documentation

## 2016-12-25 DIAGNOSIS — K760 Fatty (change of) liver, not elsewhere classified: Secondary | ICD-10-CM | POA: Insufficient documentation

## 2016-12-25 DIAGNOSIS — Z79899 Other long term (current) drug therapy: Secondary | ICD-10-CM | POA: Insufficient documentation

## 2016-12-25 DIAGNOSIS — R1011 Right upper quadrant pain: Secondary | ICD-10-CM

## 2016-12-25 DIAGNOSIS — Z91040 Latex allergy status: Secondary | ICD-10-CM | POA: Insufficient documentation

## 2016-12-25 DIAGNOSIS — Z419 Encounter for procedure for purposes other than remedying health state, unspecified: Secondary | ICD-10-CM

## 2016-12-25 DIAGNOSIS — K8000 Calculus of gallbladder with acute cholecystitis without obstruction: Secondary | ICD-10-CM | POA: Diagnosis present

## 2016-12-25 DIAGNOSIS — M19071 Primary osteoarthritis, right ankle and foot: Secondary | ICD-10-CM | POA: Insufficient documentation

## 2016-12-25 DIAGNOSIS — E119 Type 2 diabetes mellitus without complications: Secondary | ICD-10-CM | POA: Insufficient documentation

## 2016-12-25 DIAGNOSIS — K660 Peritoneal adhesions (postprocedural) (postinfection): Secondary | ICD-10-CM | POA: Insufficient documentation

## 2016-12-25 DIAGNOSIS — K8010 Calculus of gallbladder with chronic cholecystitis without obstruction: Principal | ICD-10-CM | POA: Insufficient documentation

## 2016-12-25 DIAGNOSIS — Z7984 Long term (current) use of oral hypoglycemic drugs: Secondary | ICD-10-CM | POA: Insufficient documentation

## 2016-12-25 DIAGNOSIS — K219 Gastro-esophageal reflux disease without esophagitis: Secondary | ICD-10-CM | POA: Insufficient documentation

## 2016-12-25 DIAGNOSIS — K8020 Calculus of gallbladder without cholecystitis without obstruction: Secondary | ICD-10-CM | POA: Diagnosis not present

## 2016-12-25 DIAGNOSIS — F17210 Nicotine dependence, cigarettes, uncomplicated: Secondary | ICD-10-CM | POA: Insufficient documentation

## 2016-12-25 DIAGNOSIS — M19072 Primary osteoarthritis, left ankle and foot: Secondary | ICD-10-CM | POA: Insufficient documentation

## 2016-12-25 DIAGNOSIS — K819 Cholecystitis, unspecified: Secondary | ICD-10-CM

## 2016-12-25 DIAGNOSIS — I10 Essential (primary) hypertension: Secondary | ICD-10-CM | POA: Insufficient documentation

## 2016-12-25 HISTORY — PX: CHOLECYSTECTOMY: SHX55

## 2016-12-25 LAB — URINALYSIS, ROUTINE W REFLEX MICROSCOPIC
Bacteria, UA: NONE SEEN
Bilirubin Urine: NEGATIVE
Glucose, UA: NEGATIVE mg/dL
KETONES UR: 5 mg/dL — AB
Leukocytes, UA: NEGATIVE
Nitrite: NEGATIVE
PROTEIN: NEGATIVE mg/dL
Specific Gravity, Urine: 1.027 (ref 1.005–1.030)
pH: 5 (ref 5.0–8.0)

## 2016-12-25 LAB — COMPREHENSIVE METABOLIC PANEL
ALBUMIN: 4.2 g/dL (ref 3.5–5.0)
ALT: 17 U/L (ref 14–54)
ANION GAP: 10 (ref 5–15)
AST: 19 U/L (ref 15–41)
Alkaline Phosphatase: 76 U/L (ref 38–126)
BUN: 22 mg/dL — ABNORMAL HIGH (ref 6–20)
CALCIUM: 9 mg/dL (ref 8.9–10.3)
CO2: 31 mmol/L (ref 22–32)
Chloride: 97 mmol/L — ABNORMAL LOW (ref 101–111)
Creatinine, Ser: 0.8 mg/dL (ref 0.44–1.00)
GFR calc Af Amer: >60 mL/min (ref >60)
GFR calc non Af Amer: >60 mL/min (ref >60)
Glucose, Bld: 216 mg/dL — ABNORMAL HIGH (ref 65–99)
Potassium: 2.9 mmol/L — ABNORMAL LOW (ref 3.5–5.1)
Sodium: 138 mmol/L (ref 135–145)
Total Bilirubin: 0.3 mg/dL (ref 0.3–1.2)
Total Protein: 7.2 g/dL (ref 6.5–8.1)

## 2016-12-25 LAB — LIPASE, BLOOD: Lipase: 24 U/L (ref 11–51)

## 2016-12-25 LAB — GLUCOSE, CAPILLARY
Glucose-Capillary: 214 mg/dL — ABNORMAL HIGH (ref 65–99)
Glucose-Capillary: 193 mg/dL — ABNORMAL HIGH (ref 65–99)
Glucose-Capillary: 149 mg/dL — ABNORMAL HIGH (ref 65–99)

## 2016-12-25 LAB — CBC
HCT: 36.2 % (ref 36.0–46.0)
HEMOGLOBIN: 12.1 g/dL (ref 12.0–15.0)
MCH: 27.7 pg (ref 26.0–34.0)
MCHC: 33.4 g/dL (ref 30.0–36.0)
MCV: 82.8 fL (ref 78.0–100.0)
Platelets: 395 10*3/uL (ref 150–400)
RBC: 4.37 MIL/uL (ref 3.87–5.11)
RDW: 13.9 % (ref 11.5–15.5)
WBC: 13.5 10*3/uL — ABNORMAL HIGH (ref 4.0–10.5)

## 2016-12-25 SURGERY — LAPAROSCOPIC CHOLECYSTECTOMY WITH INTRAOPERATIVE CHOLANGIOGRAM
Anesthesia: General | Site: Abdomen

## 2016-12-25 MED ORDER — ENOXAPARIN SODIUM 40 MG/0.4ML ~~LOC~~ SOLN
40.0000 mg | SUBCUTANEOUS | Status: DC
  Administered 2016-12-26: 40 mg via SUBCUTANEOUS
  Filled 2016-12-25: qty 0.4

## 2016-12-25 MED ORDER — DEXTROSE 5 % IV SOLN: 2.0000 g | INTRAVENOUS | Status: DC

## 2016-12-25 MED ORDER — IOPAMIDOL (ISOVUE-300) INJECTION 61%
INTRAVENOUS | Status: AC
  Filled 2016-12-25: qty 50

## 2016-12-25 MED ORDER — DIPHENHYDRAMINE HCL 50 MG/ML IJ SOLN: 25.0000 mg | Freq: Four times a day (QID) | INTRAMUSCULAR | Status: DC | PRN

## 2016-12-25 MED ORDER — ONDANSETRON HCL 4 MG/2ML IJ SOLN: 4.0000 mg | Freq: Four times a day (QID) | INTRAMUSCULAR | Status: DC | PRN

## 2016-12-25 MED ORDER — MORPHINE SULFATE (PF) 2 MG/ML IV SOLN
1.0000 mg | INTRAVENOUS | Status: DC | PRN
  Administered 2016-12-25: 2 mg via INTRAVENOUS
  Filled 2016-12-25 (×2): qty 1

## 2016-12-25 MED ORDER — MEPERIDINE HCL 50 MG/ML IJ SOLN: 6.2500 mg | INTRAMUSCULAR | Status: DC | PRN

## 2016-12-25 MED ORDER — PROPOFOL 10 MG/ML IV BOLUS
INTRAVENOUS | Status: AC
  Filled 2016-12-25: qty 20

## 2016-12-25 MED ORDER — BUPIVACAINE-EPINEPHRINE (PF) 0.25% -1:200000 IJ SOLN
INTRAMUSCULAR | Status: AC
  Filled 2016-12-25: qty 30

## 2016-12-25 MED ORDER — ROCURONIUM BROMIDE 10 MG/ML (PF) SYRINGE
PREFILLED_SYRINGE | INTRAVENOUS | Status: DC | PRN
  Administered 2016-12-25 (×2): 5 mg via INTRAVENOUS
  Administered 2016-12-25: 20 mg via INTRAVENOUS

## 2016-12-25 MED ORDER — SUGAMMADEX SODIUM 200 MG/2ML IV SOLN
INTRAVENOUS | Status: DC | PRN
  Administered 2016-12-25: 150 mg via INTRAVENOUS

## 2016-12-25 MED ORDER — LISINOPRIL 20 MG PO TABS
40.0000 mg | ORAL_TABLET | Freq: Every day | ORAL | Status: DC
  Administered 2016-12-26: 40 mg via ORAL
  Filled 2016-12-25: qty 2

## 2016-12-25 MED ORDER — ONDANSETRON HCL 4 MG/2ML IJ SOLN
INTRAMUSCULAR | Status: AC
  Filled 2016-12-25: qty 2

## 2016-12-25 MED ORDER — MORPHINE SULFATE (PF) 2 MG/ML IV SOLN: 2.0000 mg | INTRAVENOUS | Status: DC | PRN

## 2016-12-25 MED ORDER — TRAMADOL HCL 50 MG PO TABS: 50.0000 mg | ORAL_TABLET | Freq: Four times a day (QID) | ORAL | Status: DC | PRN

## 2016-12-25 MED ORDER — LACTATED RINGERS IV SOLN
INTRAVENOUS | Status: DC | PRN
  Administered 2016-12-25 (×2): via INTRAVENOUS

## 2016-12-25 MED ORDER — SUCCINYLCHOLINE CHLORIDE 200 MG/10ML IV SOSY
PREFILLED_SYRINGE | INTRAVENOUS | Status: DC | PRN
  Administered 2016-12-25: 120 mg via INTRAVENOUS

## 2016-12-25 MED ORDER — BUPIVACAINE-EPINEPHRINE 0.25% -1:200000 IJ SOLN
INTRAMUSCULAR | Status: DC | PRN
  Administered 2016-12-25: 30 mL

## 2016-12-25 MED ORDER — MIDAZOLAM HCL 2 MG/2ML IJ SOLN
INTRAMUSCULAR | Status: DC | PRN
  Administered 2016-12-25: 2 mg via INTRAVENOUS

## 2016-12-25 MED ORDER — DEXAMETHASONE SODIUM PHOSPHATE 10 MG/ML IJ SOLN
INTRAMUSCULAR | Status: DC | PRN
  Administered 2016-12-25: 4 mg via INTRAVENOUS

## 2016-12-25 MED ORDER — ENOXAPARIN SODIUM 40 MG/0.4ML ~~LOC~~ SOLN: 40.0000 mg | SUBCUTANEOUS | Status: DC

## 2016-12-25 MED ORDER — POTASSIUM CHLORIDE 10 MEQ/100ML IV SOLN
10.0000 meq | Freq: Once | INTRAVENOUS | Status: AC
  Administered 2016-12-25: 10 meq via INTRAVENOUS
  Filled 2016-12-25: qty 100

## 2016-12-25 MED ORDER — ONDANSETRON 4 MG PO TBDP: 4.0000 mg | ORAL_TABLET | Freq: Four times a day (QID) | ORAL | Status: DC | PRN

## 2016-12-25 MED ORDER — ONDANSETRON HCL 4 MG/2ML IJ SOLN: 4.0000 mg | Freq: Once | INTRAMUSCULAR | Status: DC | PRN

## 2016-12-25 MED ORDER — ONDANSETRON HCL 4 MG/2ML IJ SOLN
INTRAMUSCULAR | Status: DC | PRN
  Administered 2016-12-25: 4 mg via INTRAVENOUS

## 2016-12-25 MED ORDER — FENTANYL CITRATE (PF) 250 MCG/5ML IJ SOLN
INTRAMUSCULAR | Status: AC
  Filled 2016-12-25: qty 5

## 2016-12-25 MED ORDER — IOPAMIDOL (ISOVUE-300) INJECTION 61%
INTRAVENOUS | Status: DC | PRN
  Administered 2016-12-25: 3 mL

## 2016-12-25 MED ORDER — DOCUSATE SODIUM 100 MG PO CAPS
100.0000 mg | ORAL_CAPSULE | Freq: Two times a day (BID) | ORAL | Status: DC
  Administered 2016-12-25 – 2016-12-26 (×2): 100 mg via ORAL
  Filled 2016-12-25 (×2): qty 1

## 2016-12-25 MED ORDER — ACETAMINOPHEN 325 MG PO TABS: 650.0000 mg | ORAL_TABLET | Freq: Four times a day (QID) | ORAL | Status: DC | PRN

## 2016-12-25 MED ORDER — LIDOCAINE 2% (20 MG/ML) 5 ML SYRINGE
INTRAMUSCULAR | Status: DC | PRN
  Administered 2016-12-25: 40 mg via INTRAVENOUS
  Administered 2016-12-25: 60 mg via INTRAVENOUS

## 2016-12-25 MED ORDER — MORPHINE SULFATE (PF) 4 MG/ML IV SOLN
4.0000 mg | Freq: Once | INTRAVENOUS | Status: AC
  Administered 2016-12-25: 4 mg via INTRAVENOUS
  Filled 2016-12-25: qty 1

## 2016-12-25 MED ORDER — MIDAZOLAM HCL 2 MG/2ML IJ SOLN
INTRAMUSCULAR | Status: AC
  Filled 2016-12-25: qty 2

## 2016-12-25 MED ORDER — FENTANYL CITRATE (PF) 250 MCG/5ML IJ SOLN
INTRAMUSCULAR | Status: DC | PRN
  Administered 2016-12-25: 100 ug via INTRAVENOUS
  Administered 2016-12-25 (×3): 50 ug via INTRAVENOUS
  Administered 2016-12-25: 25 ug via INTRAVENOUS

## 2016-12-25 MED ORDER — ACETAMINOPHEN 650 MG RE SUPP: 650.0000 mg | Freq: Four times a day (QID) | RECTAL | Status: DC | PRN

## 2016-12-25 MED ORDER — HYDROMORPHONE HCL 1 MG/ML IJ SOLN
1.0000 mg | Freq: Once | INTRAMUSCULAR | Status: AC
  Administered 2016-12-25: 1 mg via INTRAVENOUS
  Filled 2016-12-25: qty 1

## 2016-12-25 MED ORDER — FENTANYL CITRATE (PF) 100 MCG/2ML IJ SOLN: 25.0000 ug | INTRAMUSCULAR | Status: DC | PRN

## 2016-12-25 MED ORDER — 0.9 % SODIUM CHLORIDE (POUR BTL) OPTIME
TOPICAL | Status: DC | PRN
  Administered 2016-12-25: 1000 mL

## 2016-12-25 MED ORDER — DEXAMETHASONE SODIUM PHOSPHATE 10 MG/ML IJ SOLN
INTRAMUSCULAR | Status: AC
  Filled 2016-12-25: qty 1

## 2016-12-25 MED ORDER — AMLODIPINE BESYLATE 5 MG PO TABS
5.0000 mg | ORAL_TABLET | Freq: Every day | ORAL | Status: DC
Start: 2016-12-25 — End: 2016-12-26
  Administered 2016-12-26: 5 mg via ORAL
  Filled 2016-12-25: qty 1

## 2016-12-25 MED ORDER — LIDOCAINE 2% (20 MG/ML) 5 ML SYRINGE
INTRAMUSCULAR | Status: AC
  Filled 2016-12-25: qty 10

## 2016-12-25 MED ORDER — PHENYLEPHRINE 40 MCG/ML (10ML) SYRINGE FOR IV PUSH (FOR BLOOD PRESSURE SUPPORT)
PREFILLED_SYRINGE | INTRAVENOUS | Status: DC | PRN
  Administered 2016-12-25: 200 ug via INTRAVENOUS
  Administered 2016-12-25 (×3): 120 ug via INTRAVENOUS

## 2016-12-25 MED ORDER — SODIUM CHLORIDE 0.9 % IV SOLN
INTRAVENOUS | Status: DC
  Administered 2016-12-25: 18:00:00 via INTRAVENOUS

## 2016-12-25 MED ORDER — PHENYLEPHRINE 40 MCG/ML (10ML) SYRINGE FOR IV PUSH (FOR BLOOD PRESSURE SUPPORT)
PREFILLED_SYRINGE | INTRAVENOUS | Status: AC
  Filled 2016-12-25: qty 10

## 2016-12-25 MED ORDER — EPHEDRINE 5 MG/ML INJ
INTRAVENOUS | Status: AC
  Filled 2016-12-25: qty 20

## 2016-12-25 MED ORDER — SUGAMMADEX SODIUM 500 MG/5ML IV SOLN
INTRAVENOUS | Status: AC
  Filled 2016-12-25: qty 5

## 2016-12-25 MED ORDER — ALUM & MAG HYDROXIDE-SIMETH 200-200-20 MG/5ML PO SUSP
30.0000 mL | Freq: Four times a day (QID) | ORAL | Status: DC | PRN
  Administered 2016-12-25: 30 mL via ORAL
  Filled 2016-12-25: qty 30

## 2016-12-25 MED ORDER — FENTANYL CITRATE (PF) 100 MCG/2ML IJ SOLN
INTRAMUSCULAR | Status: AC
  Filled 2016-12-25: qty 2

## 2016-12-25 MED ORDER — INSULIN ASPART 100 UNIT/ML ~~LOC~~ SOLN
0.0000 [IU] | Freq: Three times a day (TID) | SUBCUTANEOUS | Status: DC
  Administered 2016-12-25: 3 [IU] via SUBCUTANEOUS
  Administered 2016-12-26: 2 [IU] via SUBCUTANEOUS
  Administered 2016-12-26: 3 [IU] via SUBCUTANEOUS

## 2016-12-25 MED ORDER — LACTATED RINGERS IR SOLN
Status: DC | PRN
  Administered 2016-12-25: 2000 mL

## 2016-12-25 MED ORDER — ONDANSETRON 4 MG PO TBDP
4.0000 mg | ORAL_TABLET | Freq: Once | ORAL | Status: AC | PRN
  Administered 2016-12-25: 4 mg via ORAL
  Filled 2016-12-25: qty 1

## 2016-12-25 MED ORDER — OXYCODONE HCL 5 MG PO TABS
5.0000 mg | ORAL_TABLET | ORAL | Status: DC | PRN
  Administered 2016-12-25: 10 mg via ORAL
  Administered 2016-12-25: 5 mg via ORAL
  Administered 2016-12-26: 10 mg via ORAL
  Administered 2016-12-26 (×2): 5 mg via ORAL
  Filled 2016-12-25: qty 2
  Filled 2016-12-25 (×3): qty 1
  Filled 2016-12-25: qty 2

## 2016-12-25 MED ORDER — DEXTROSE 5 % IV SOLN
2.0000 g | Freq: Once | INTRAVENOUS | Status: AC
  Administered 2016-12-25: 2 g via INTRAVENOUS
  Filled 2016-12-25: qty 2

## 2016-12-25 MED ORDER — PROPOFOL 10 MG/ML IV BOLUS
INTRAVENOUS | Status: DC | PRN
  Administered 2016-12-25: 150 mg via INTRAVENOUS

## 2016-12-25 MED ORDER — DIPHENHYDRAMINE HCL 25 MG PO CAPS: 25.0000 mg | ORAL_CAPSULE | Freq: Four times a day (QID) | ORAL | Status: DC | PRN

## 2016-12-25 MED ORDER — SUCCINYLCHOLINE CHLORIDE 200 MG/10ML IV SOSY
PREFILLED_SYRINGE | INTRAVENOUS | Status: AC
  Filled 2016-12-25: qty 10

## 2016-12-25 SURGICAL SUPPLY — 57 items
ADH SKN CLS APL DERMABOND .7 (GAUZE/BANDAGES/DRESSINGS)
APL SKNCLS STERI-STRIP NONHPOA (GAUZE/BANDAGES/DRESSINGS) ×1
APL SRG 38 LTWT LNG FL B (MISCELLANEOUS)
APPLICATOR ARISTA FLEXITIP XL (MISCELLANEOUS) IMPLANT
APPLIER CLIP 5 13 M/L LIGAMAX5 (MISCELLANEOUS)
APPLIER CLIP ROT 10 11.4 M/L (STAPLE)
APPLIER CLIP UNV 5X34 EPIX (ENDOMECHANICALS) ×1 IMPLANT
APR CLP MED LRG 11.4X10 (STAPLE)
APR CLP MED LRG 5 ANG JAW (MISCELLANEOUS)
APR XCLPCLP 20M/L UNV 34X5 (ENDOMECHANICALS) ×1
BAG SPEC RTRVL 10 TROC 200 (ENDOMECHANICALS) ×1
BANDAGE ADH SHEER 1  50/CT (GAUZE/BANDAGES/DRESSINGS) ×8 IMPLANT
BENZOIN TINCTURE PRP APPL 2/3 (GAUZE/BANDAGES/DRESSINGS) ×1 IMPLANT
CABLE HIGH FREQUENCY MONO STRZ (ELECTRODE) ×2 IMPLANT
CHLORAPREP W/TINT 26ML (MISCELLANEOUS) ×2 IMPLANT
CLIP APPLIE 5 13 M/L LIGAMAX5 (MISCELLANEOUS) IMPLANT
CLIP APPLIE ROT 10 11.4 M/L (STAPLE) IMPLANT
CLIP LIGATING HEMO O LOK GREEN (MISCELLANEOUS) IMPLANT
COVER MAYO STAND STRL (DRAPES) IMPLANT
COVER SURGICAL LIGHT HANDLE (MISCELLANEOUS) ×2 IMPLANT
DECANTER SPIKE VIAL GLASS SM (MISCELLANEOUS) ×2 IMPLANT
DERMABOND ADVANCED (GAUZE/BANDAGES/DRESSINGS)
DERMABOND ADVANCED .7 DNX12 (GAUZE/BANDAGES/DRESSINGS) IMPLANT
DEVICE PMI PUNCTURE CLOSURE (MISCELLANEOUS) ×1 IMPLANT
DRAPE C-ARM 42X120 X-RAY (DRAPES) ×1 IMPLANT
DRSG TEGADERM 2-3/8X2-3/4 SM (GAUZE/BANDAGES/DRESSINGS) ×1 IMPLANT
ELECT PENCIL ROCKER SW 15FT (MISCELLANEOUS) IMPLANT
ELECT REM PT RETURN 15FT ADLT (MISCELLANEOUS) ×2 IMPLANT
GAUZE SPONGE 2X2 8PLY STRL LF (GAUZE/BANDAGES/DRESSINGS) IMPLANT
GLOVE BIO SURGEON STRL SZ7.5 (GLOVE) ×2 IMPLANT
GLOVE INDICATOR 8.0 STRL GRN (GLOVE) ×2 IMPLANT
GOWN STRL REUS W/TWL XL LVL3 (GOWN DISPOSABLE) ×6 IMPLANT
GRASPER SUT TROCAR 14GX15 (MISCELLANEOUS) IMPLANT
HEMOSTAT ARISTA ABSORB 3G PWDR (MISCELLANEOUS) IMPLANT
HEMOSTAT SNOW SURGICEL 2X4 (HEMOSTASIS) IMPLANT
KIT BASIN OR (CUSTOM PROCEDURE TRAY) ×2 IMPLANT
L-HOOK LAP DISP 36CM (ELECTROSURGICAL)
LHOOK LAP DISP 36CM (ELECTROSURGICAL) IMPLANT
POUCH RETRIEVAL ECOSAC 10 (ENDOMECHANICALS) ×1 IMPLANT
POUCH RETRIEVAL ECOSAC 10MM (ENDOMECHANICALS) ×1
SCISSORS LAP 5X35 DISP (ENDOMECHANICALS) ×2 IMPLANT
SET CHOLANGIOGRAPH MIX (MISCELLANEOUS) ×1 IMPLANT
SET IRRIG TUBING LAPAROSCOPIC (IRRIGATION / IRRIGATOR) ×1 IMPLANT
SLEEVE XCEL OPT CAN 5 100 (ENDOMECHANICALS) ×4 IMPLANT
SPONGE GAUZE 2X2 STER 10/PKG (GAUZE/BANDAGES/DRESSINGS) ×1
STRIP CLOSURE SKIN 1/2X4 (GAUZE/BANDAGES/DRESSINGS) IMPLANT
STRIP CLOSURE SKIN 1/4X4 (GAUZE/BANDAGES/DRESSINGS) ×1 IMPLANT
SUT MNCRL AB 4-0 PS2 18 (SUTURE) ×2 IMPLANT
SUT VICRYL 0 TIES 12 18 (SUTURE) IMPLANT
SUT VICRYL 0 UR6 27IN ABS (SUTURE) IMPLANT
TOWEL OR 17X26 10 PK STRL BLUE (TOWEL DISPOSABLE) ×2 IMPLANT
TOWEL OR NON WOVEN STRL DISP B (DISPOSABLE) ×2 IMPLANT
TRAY LAPAROSCOPIC (CUSTOM PROCEDURE TRAY) ×2 IMPLANT
TROCAR BLADELESS OPT 5 100 (ENDOMECHANICALS) ×2 IMPLANT
TROCAR XCEL BLUNT TIP 100MML (ENDOMECHANICALS) IMPLANT
TROCAR XCEL NON-BLD 11X100MML (ENDOMECHANICALS) IMPLANT
TUBING INSUF HEATED (TUBING) ×2 IMPLANT

## 2016-12-25 NOTE — Anesthesia Procedure Notes (Signed)
Date/Time: 12/25/2016 11:04 AM Performed by: Cynda Familia Oxygen Delivery Method: Simple face mask Placement Confirmation: positive ETCO2 and breath sounds checked- equal and bilateral Comments: Extubated mask simple--- good AW to PACU--- VSS O2 intact

## 2016-12-25 NOTE — Anesthesia Procedure Notes (Signed)
Procedure Name: Intubation Date/Time: 12/25/2016 8:48 AM Performed by: Cynda Familia Pre-anesthesia Checklist: Patient identified, Emergency Drugs available, Suction available and Patient being monitored Patient Re-evaluated:Patient Re-evaluated prior to induction Oxygen Delivery Method: Circle System Utilized Preoxygenation: Pre-oxygenation with 100% oxygen Induction Type: IV induction, Rapid sequence and Cricoid Pressure applied Ventilation: Mask ventilation without difficulty Laryngoscope Size: Miller and 2 Grade View: Grade I Tube type: Oral Tube size: 6.5 mm Number of attempts: 1 Airway Equipment and Method: Stylet Placement Confirmation: ETT inserted through vocal cords under direct vision,  positive ETCO2 and breath sounds checked- equal and bilateral Secured at: 21 cm Tube secured with: Tape Dental Injury: Teeth and Oropharynx as per pre-operative assessment  Comments: Smooth IV induction -- Oddono --- intubation AM CRNA--- atraumatic--- teeth and mouth as preop-- many missing /chipped and loose --- as per pt report and exam--- bilat BS Oddono

## 2016-12-25 NOTE — Interval H&P Note (Signed)
History and Physical Interval Note:  12/25/2016 8:20 AM  Hannah [name]  has presented today for surgery, with the diagnosis of cholecystitis  The various methods of treatment have been discussed with the patient and family. After consideration of risks, benefits and other options for treatment, the patient has consented to  Procedure(s): LAPAROSCOPIC CHOLECYSTECTOMY WITH INTRAOPERATIVE CHOLANGIOGRAM (N/A) as a surgical intervention .  The patient's history has been reviewed, patient examined, no change in status, stable for surgery.  I have reviewed the patient's chart and labs.  Questions were answered to the patient's satisfaction.    I believe the patient's symptoms are consistent with gallbladder disease.  We discussed gallbladder disease.  I discussed laparoscopic cholecystectomy with IOC in detail.  The patient was shown diagrams detailing the procedure.  We discussed the risks and benefits of a laparoscopic cholecystectomy including, but not limited to bleeding, infection, injury to surrounding structures such as the intestine or liver, bile leak, retained gallstones, need to convert to an open procedure, prolonged diarrhea, blood clots such as  DVT, common bile duct injury, anesthesia risks, and possible need for additional procedures.  We discussed the typical post-operative recovery course. I explained that the likelihood of improvement of their symptoms is good.  We discussed potential ramifications of her hernia mesh.   Leighton Ruff. Redmond Pulling, MD, [place], Bariatric, & Minimally Invasive Surgery [facility], [place]   [facility]

## 2016-12-25 NOTE — Anesthesia Preprocedure Evaluation (Addendum)
Anesthesia Evaluation  Patient identified by MRN, date of birth, ID band Patient awake    Reviewed: Allergy & Precautions, NPO status , Patient's Chart, lab work & pertinent test results  Airway Mallampati: II  TM Distance: >3 FB Neck ROM: Full    Dental  (+) Dental Advisory Given, Loose, Missing, Chipped   Pulmonary Current Smoker,    breath sounds clear to auscultation       Cardiovascular hypertension, Pt. on medications  Rhythm:Regular Rate:Normal     Neuro/Psych negative neurological ROS     GI/Hepatic Neg liver ROS, GERD  Medicated,  Endo/Other  diabetes, Type 2, Oral Hypoglycemic Agents  Renal/GU negative Renal ROS     Musculoskeletal  (+) Arthritis ,   Abdominal   Peds  Hematology negative hematology ROS (+)   Anesthesia Other Findings   Reproductive/Obstetrics                            Anesthesia Physical  Anesthesia Plan  ASA: III  Anesthesia Plan: General   Post-op Pain Management:    Induction: Intravenous  PONV Risk Score and Plan: 2 and Ondansetron, Dexamethasone, Treatment may vary due to age or medical condition and Midazolam  Airway Management Planned: Oral ETT  Additional Equipment:   Intra-op Plan:   Post-operative Plan: Extubation in OR  Informed Consent: I have reviewed the patients History and Physical, chart, labs and discussed the procedure including the risks, benefits and alternatives for the proposed anesthesia with the patient or authorized representative who has indicated his/her understanding and acceptance.   Dental advisory given  Plan Discussed with: CRNA  Anesthesia Plan Comments:         Anesthesia Quick Evaluation

## 2016-12-25 NOTE — Op Note (Signed)
Hannah [name] [id number] 11-09-1960 12/25/2016  Laparoscopic Cholecystectomy with IOC and laparoscopic lysis of adhesions (30 min) Procedure Note  Indications: This patient presents with symptomatic gallbladder disease and will undergo laparoscopic cholecystectomy. She had previously undergone a laparoscopic repair of an umbilical hernia with mesh approximately 2 years ago. Please see chart for additional details regarding risk and benefits that I discussed with the patient  Pre-operative Diagnosis: Calculus of gallbladder with acute cholecystitis, without mention of obstruction  Post-operative Diagnosis: Same + intra-abdominal adhesions  Surgeon: Gayland Curry   Assistants: none  Anesthesia: General endotracheal anesthesia  EBL: 50cc  Procedure Details  The patient was seen again in the Holding Room. The risks, benefits, complications, treatment options, and expected outcomes were discussed with the patient. The possibilities of reaction to medication, pulmonary aspiration, perforation of viscus, bleeding, recurrent infection, finding a normal gallbladder, the need for additional procedures, failure to diagnose a condition, the possible need to convert to an open procedure, and creating a complication requiring transfusion or operation were discussed with the patient. The likelihood of improving the patient's symptoms with return to their baseline status is good.  The patient and/or family concurred with the proposed plan, giving informed consent. The site of surgery properly noted. The patient was taken to Operating Room, identified as Hannah [name] and the procedure verified as Laparoscopic Cholecystectomy with Intraoperative Cholangiogram. A Time Out was held and the above information confirmed. Antibiotic prophylaxis was administered.   Prior to the induction of general anesthesia, antibiotic prophylaxis was administered. General endotracheal anesthesia was then administered and tolerated  well. After the induction, the abdomen was prepped with Chloraprep and draped in the sterile fashion. The patient was positioned in the supine position.  Because she had prior mesh inserted around her umbilicus that was 12 cm in size, I elected to gain access to her abdomen using the Optiview technique. A small incision was made in the left upper quadrant just below the left subcostal margin through the old scar. A 0 5 mm laparoscope with a 5 mm trocar was advanced through layers of the abdominal wall and entered the abdominal cavity. Pneumoperitoneum was smoothly established up to a patient pressure of 15 mmHg. The laparoscope was advanced and the abdominal cavity was surveilled. There were extensive omental adhesions to the mesh going up into the midline as well as in the lower midline and left and right upper quadrants. Two 5 mm trochars were placed in the right abdomen under direct visualization. I started to take down the omental adhesions with a combination of EndoShears with cautery as well as [place] with cautery. These omental adhesions to the mesh were quite vascularized. And there was oozing and hemostasis was achieved with electrocautery. There is no bowel anywhere near these adhesions. After approximately 30 minutes I was able to clear the entire anterior abdoinal wall of adhesions. I ended up changing the left upper quadrant 5 mm trocar to an 11 mm trocar and placing the 5 mm trocar in the infraumbilical position under direct visualization.   We positioned the patient in reverse Trendelenburg, tilted slightly to the patient's left.  The gallbladder was identified, the fundus grasped and retracted cephalad. Adhesions were lysed bluntly and with the electrocautery where indicated, taking care not to injure any adjacent organs or viscus. The infundibulum was grasped and retracted laterally, exposing the peritoneum overlying the triangle of Calot. This was then divided and exposed in a blunt fashion.  A critical view of the cystic duct and cystic  artery was obtained.  The cystic duct was clearly identified and bluntly dissected circumferentially. The cystic duct was ligated with a clip distally.   An incision was made in the cystic duct and the [facility] cholangiogram catheter introduced. The catheter was secured using a clip. A cholangiogram was then obtained which showed good visualization of the distal and proximal biliary tree with no sign of filling defects or obstruction.  Contrast flowed easily into the duodenum. The catheter was then removed.   The cystic duct was then ligated with clips and divided. The cystic artery which had been identified & dissected free was ligated with clips and divided as well. A small posterior branch of the cystic artery was clipped as well  The gallbladder was dissected from the liver bed in retrograde fashion with the electrocautery. The gallbladder was removed and placed in an Ecco sac.  The gallbladder and Ecco sac were then removed through the left upper quadrant port site. The liver bed was irrigated and inspected. The omentum was inspected as well. Hemostasis was achieved with the electrocautery. Copious irrigation was utilized and was repeatedly aspirated until clear.  A PMI suture passer was used to close the left upper quadrant fascial defect with 2 interrupted 0 Vicryl sutures using laparoscopic guidance  We again inspected the right upper quadrant for hemostasis.  The left upper quadrant closure was inspected and there was no air leak and nothing trapped within the closure. Pneumoperitoneum was released as we removed the trocars.  4-0 Monocryl was used to close the skin.   Benzoin, steri-strips, and clean dressings were applied. The patient was then extubated and brought to the recovery room in stable condition. Instrument, sponge, and needle counts were correct at closure and at the conclusion of the case.   Findings: Cholecystitis with Cholelithiasis; omental  adhesions to mesh  Estimated Blood Loss: less than 50 mL         Drains: none         Specimens: Gallbladder           Complications: None; patient tolerated the procedure well.         Disposition: PACU - hemodynamically stable.         Condition: stable  Leighton Ruff. Redmond Pulling, MD, FACS General, Bariatric, & Minimally Invasive Surgery [facility], [place]

## 2016-12-25 NOTE — ED Triage Notes (Signed)
Pt reports having right upper abd pain along with vomiting and diarrhea. Pt reports having pain intermittently for several months.

## 2016-12-25 NOTE — ED Notes (Addendum)
Patient transported to OR.

## 2016-12-25 NOTE — ED Provider Notes (Signed)
[facility] DEPT Provider Note   CSN: [id number] Arrival date & time: 12/25/16  0306  History   Chief Complaint Chief Complaint  Patient presents with  . Abdominal Pain    HPI Hannah [name] is a 56 y.o. female.  HPI   56 year old female presents today with complaints of abdominal pain. Patient notes that over the 3-4 months she's had intermittent right upper quadrant abdominal pain. She notes this is often worsened by certain foods. She notes that at night she often has attacks". She describes them as squeezing. She notes eating dinner without complication last night, but then awoke this morning around 1 AM with severe right upper quadrant abdominal pain. She notes usually she has some nausea, but this time was having vomiting with this. She denies any preceding illnesses including fever, denies any lower abdominal pain or radiation of symptoms. Patient reports using Gas-X followed by Pepto-Bismol with no improvement of her symptoms.  Past Medical History:  Diagnosis Date  . Arthritis    feet  . Diabetes mellitus    type 2  . Dysrhythmia   . Family history of adverse reaction to anesthesia    father: post-op nausea and vomiting  . Heart murmur   . Hypertension   . Wears glasses   . Wears partial dentures    partial upper    Patient Active Problem List   Diagnosis Date Noted  . LOCALIZED SUPERFICIAL SWELLING MASS OR LUMP 03/26/2007  . VAGINAL DISCHARGE 10/05/2006  . ABDOMINAL PAIN, CHRONIC 06/08/2006  . TOBACCO ABUSE 04/27/2006  . HYPERTENSION 04/27/2006  . HEMORRHOIDS 04/27/2006  . FISTULA, ANAL 04/27/2006  . PALPITATIONS 04/27/2006  . BREAST CYST, HX OF 04/27/2006    Past Surgical History:  Procedure Laterality Date  . ABDOMINAL HYSTERECTOMY     in her 58s  . BREAST SURGERY  2 different occasions, over 5 years ago   cyst removal BIL  . COLON SURGERY  at least 6 yrs ago per patient   polyps and fissures   . DIAGNOSTIC LAPAROSCOPY  over 20 years ago   exploratory lap  . [place]   left  . INSERTION OF MESH N/A 11/13/2014   Procedure: INSERTION OF MESH;  Surgeon: Ralene Ok, MD;  Location: [facility];  Service: General;  Laterality: N/A;  . MICROLARYNGOSCOPY N/A 05/19/2014   Procedure: MICROLARYNGOSCOPY WITH EXCISION OF VOCAL CORD LESION;  Surgeon: Rozetta Nunnery, MD;  Location: [facility];  Service: ENT;  Laterality: N/A;  . rectal fissure    . UMBILICAL HERNIA REPAIR N/A 11/13/2014   Procedure: LAPAROSCOPIC UMBILICAL HERNIA REPAIR WITH MESH;  Surgeon: Ralene Ok, MD;  Location: [facility];  Service: General;  Laterality: N/A;    OB History    No data available       Home Medications    Prior to Admission medications   Medication Sig Start Date End Date Taking? Authorizing Provider  amLODipine (NORVASC) 5 MG tablet Take 5 mg by mouth daily.    [provider]  chlorthalidone (HYGROTON) 25 MG tablet Take 25 mg by mouth daily.     [provider]  Linagliptin-Metformin HCl (JENTADUETO) 2.09-998 MG TABS Take 1 tablet by mouth 2 (two) times daily.     [provider]  lisinopril (PRINIVIL,ZESTRIL) 40 MG tablet Take 40 mg by mouth daily.    [provider]  oxyCODONE-acetaminophen (ROXICET) 5-325 MG per tablet Take 1-2 tablets by mouth every 4 (four) hours as needed. 11/13/14  Ralene Ok, MD    Family History History reviewed. No pertinent family history.  Social History Social History  Substance Use Topics  . Smoking status: Current Every Day Smoker    Packs/day: 1.00    Years: 44.00    Types: Cigarettes  . Smokeless tobacco: Not on file  . Alcohol use No     Allergies   Hydrochlorothiazide and Latex   Review of Systems Review of Systems  All other systems reviewed and are negative.    Physical Exam Updated Vital Signs BP 110/66 (BP Location: Right Arm)   Pulse 84   Temp (!) 97.5 F (36.4 C) (Oral)   Resp 20   Ht 5' (1.524 m)   Wt 62.1  kg (137 lb)   SpO2 100%   BMI 26.76 kg/m   Physical Exam  Constitutional: She is oriented to person, place, and time. She appears well-developed and well-nourished.  HENT:  Head: Normocephalic and atraumatic.  Eyes: Pupils are equal, round, and reactive to light. Conjunctivae are normal. Right eye exhibits no discharge. Left eye exhibits no discharge. No scleral icterus.  Neck: Normal range of motion. No JVD present. No tracheal deviation present.  Cardiovascular: Normal rate, regular rhythm, normal heart sounds and intact distal pulses.   Pulmonary/Chest: Effort normal. No stridor. No respiratory distress. She has no wheezes. She has no rales. She exhibits no tenderness.  Abdominal: Soft. Bowel sounds are normal. She exhibits no distension and no mass. There is tenderness. There is no rebound and no guarding. No hernia.  Tenderness to palpation of right upper quadrant- remainder of abdominal exam benign  Neurological: She is alert and oriented to person, place, and time. Coordination normal.  Psychiatric: She has a normal mood and affect. Her behavior is normal. Judgment and thought content normal.  Nursing note and vitals reviewed.    ED Treatments / Results  Labs (all labs ordered are listed, but only abnormal results are displayed) Labs Reviewed  COMPREHENSIVE METABOLIC PANEL - Abnormal; Notable for the following:       Result Value   Potassium 2.9 (*)    Chloride 97 (*)    Glucose, Bld 216 (*)    BUN 22 (*)    All other components within normal limits  CBC - Abnormal; Notable for the following:    WBC 13.5 (*)    All other components within normal limits  URINALYSIS, ROUTINE W REFLEX MICROSCOPIC - Abnormal; Notable for the following:    Hgb urine dipstick SMALL (*)    Ketones, ur 5 (*)    Squamous Epithelial / LPF 0-5 (*)    All other components within normal limits  LIPASE, BLOOD    EKG  EKG Interpretation None       Radiology US Abdomen Limited Ruq  Result  Date: 12/25/2016 CLINICAL DATA:  Chronic right upper quadrant abdominal pain, nausea and diarrhea. Initial encounter. EXAM: ULTRASOUND ABDOMEN LIMITED RIGHT UPPER QUADRANT COMPARISON:  CT of the abdomen and pelvis performed 08/22/2013 FINDINGS: Gallbladder: A large 2.0 cm stone is noted within the gallbladder. Mild gallbladder wall thickening is noted. Ring down artifact along the gallbladder wall likely reflects adenomyomatosis. A positive ultrasonographic Murphy's sign may reflect mild cholecystitis. No pericholecystic fluid is seen. Common bile duct: Diameter: 0.7 cm, borderline prominent. Liver: No focal lesion identified. Increased parenchymal echogenicity and coarsened echotexture may reflect fatty infiltration. IMPRESSION: 1. Positive ultrasonographic Murphy's sign, with mild gallbladder wall thickening, possibly reflecting mild cholecystitis. Borderline prominence of the common bile  duct. 2. Cholelithiasis noted. Ring down artifact along the gallbladder wall likely reflects adenomyomatosis. 3. Mild fatty infiltration within the liver. Electronically Signed   By: Garald Balding M.D.   On: 12/25/2016 05:12    Procedures Procedures (including critical care time)  Medications Ordered in ED Medications  cefTRIAXone (ROCEPHIN) 2 g in dextrose 5 % 50 mL IVPB (2 g Intravenous New Bag/Given 12/25/16 0552)  ondansetron (ZOFRAN-ODT) disintegrating tablet 4 mg (4 mg Oral Given 12/25/16 0348)  morphine 4 MG/ML injection 4 mg (4 mg Intravenous Given 12/25/16 0352)  potassium chloride 10 mEq in 100 mL IVPB (0 mEq Intravenous Stopped 12/25/16 0548)  HYDROmorphone (DILAUDID) injection 1 mg (1 mg Intravenous Given 12/25/16 0447)     Initial Impression / Assessment and Plan / ED Course  I have reviewed the triage vital signs and the nursing notes.  Pertinent labs & imaging results that were available during my care of the patient were reviewed by me and considered in my medical decision making (see chart for  details).     Final Clinical Impressions(s) / ED Diagnoses   Final diagnoses:  RUQ abdominal pain  Cholecystitis    Labs: Lipase, CMP, CBC, urinalysis  Imaging: right quadrant ultrasound  Consults: General Surgery   Therapeutics: Morphine, Dilaudid, Zofran, potassium   Discharge Meds:   Assessment/Plan: 56 year old female presents today with likely acute cholecystitis. Patient required multiple doses of pain medication here in the ED. Her vomiting was controlled with anti-emetics here. Patient noted to be slightly hypokalemic, no obstructive lab findings here. General surgery will evaluate patient here in ED.     New Prescriptions New Prescriptions   No medications on file     Okey Regal, Hershal Coria 12/25/16 0601    Merryl Hacker, MD 12/25/16 [phone]

## 2016-12-25 NOTE — ED Notes (Signed)
Spoke with OR,patient to be prepared for surgery.

## 2016-12-25 NOTE — ED Notes (Signed)
Bed: [facility]2 Expected date:  Expected time:  Means of arrival:  Comments: 

## 2016-12-25 NOTE — Anesthesia Postprocedure Evaluation (Signed)
Anesthesia Post Note  Patient: Hannah [name]  Procedure(s) Performed: Procedure(s) (LRB): LYSIS OF ADHESIONS, LAPAROSCOPIC CHOLECYSTECTOMY WITH INTRAOPERATIVE CHOLANGIOGRAM (N/A)     Patient location during evaluation: PACU Anesthesia Type: General Level of consciousness: awake and alert Pain management: pain level controlled Vital Signs Assessment: post-procedure vital signs reviewed and stable Respiratory status: spontaneous breathing, nonlabored ventilation, respiratory function stable and patient connected to nasal cannula oxygen Cardiovascular status: blood pressure returned to baseline and stable Postop Assessment: no signs of nausea or vomiting Anesthetic complications: no    Last Vitals:  Vitals:   12/25/16 1130 12/25/16 1145  BP: 140/70 (!) 142/74  Pulse: 85 83  Resp: 15 12  Temp:  36.6 C    Last Pain:  Vitals:   12/25/16 1145  TempSrc:   PainSc: 0-No pain                 Lana Flaim

## 2016-12-25 NOTE — H&P (Signed)
Reason for Consult: RUQ pain Referring Physician: Horton, Hannah [name] is an 56 y.o. female.  HPI: 56 yo female with 3 months history of intermittent right upper quadrant pain. She first thought this was gas pain and tried gas-X with minimal relief. The pain can last for hours. It has been increasingly frequent recently occurring at least weekly. It is not related to any particular foods but does come on after meals. The pain radiates around her back. The pain is associated with nausea, vomiting and diarrhea.  Past Medical History:  Diagnosis Date  . Arthritis    feet  . Diabetes mellitus    type 2  . Dysrhythmia   . Family history of adverse reaction to anesthesia    father: post-op nausea and vomiting  . Heart murmur   . Hypertension   . Wears glasses   . Wears partial dentures    partial upper    Past Surgical History:  Procedure Laterality Date  . ABDOMINAL HYSTERECTOMY     in her 34s  . BREAST SURGERY  2 different occasions, over 5 years ago   cyst removal BIL  . COLON SURGERY  at least 6 yrs ago per patient   polyps and fissures   . DIAGNOSTIC LAPAROSCOPY  over 20 years ago   exploratory lap  . Hannah [name]   left  . INSERTION OF MESH N/A 11/13/2014   Procedure: INSERTION OF MESH;  Surgeon: Ralene Ok, MD;  Location: [place];  Service: General;  Laterality: N/A;  . MICROLARYNGOSCOPY N/A 05/19/2014   Procedure: MICROLARYNGOSCOPY WITH EXCISION OF VOCAL CORD LESION;  Surgeon: Rozetta Nunnery, MD;  Location: [place];  Service: ENT;  Laterality: N/A;  . rectal fissure    . UMBILICAL HERNIA REPAIR N/A 11/13/2014   Procedure: LAPAROSCOPIC UMBILICAL HERNIA REPAIR WITH MESH;  Surgeon: Ralene Ok, MD;  Location: [place];  Service: General;  Laterality: N/A;    History reviewed. No pertinent family history.  Social History:  reports that she has been smoking Cigarettes.  She has a 44.00 pack-year smoking history. She does not  have any smokeless tobacco history on file. She reports that she does not drink alcohol or use drugs.  Allergies:  Allergies  Allergen Reactions  . Hydrochlorothiazide Other (See Comments)    Reaction:  Muscle spasms  . Latex Hives and Itching    Medications: I have reviewed the patient's current medications.  Results for orders placed or performed during the hospital encounter of 12/25/16 (from the past 48 hour(s))  Lipase, blood     Status: None   Collection Time: 12/25/16  3:35 AM  Result Value Ref Range   Lipase 24 11 - 51 U/L  Comprehensive metabolic panel     Status: Abnormal   Collection Time: 12/25/16  3:35 AM  Result Value Ref Range   Sodium 138 135 - 145 mmol/L   Potassium 2.9 (L) 3.5 - 5.1 mmol/L   Chloride 97 (L) 101 - 111 mmol/L   CO2 31 22 - 32 mmol/L   Glucose, Bld 216 (H) 65 - 99 mg/dL   BUN 22 (H) 6 - 20 mg/dL   Creatinine, Ser 0.80 0.44 - 1.00 mg/dL   Calcium 9.0 8.9 - 10.3 mg/dL   Total Protein 7.2 6.5 - 8.1 g/dL   Albumin 4.2 3.5 - 5.0 g/dL   AST 19 15 - 41 U/L   ALT 17 14 - 54 U/L   Alkaline Phosphatase  76 38 - 126 U/L   Total Bilirubin 0.3 0.3 - 1.2 mg/dL   GFR calc non Af Amer >60 >60 mL/min   GFR calc Af Amer >60 >60 mL/min    Comment: (NOTE) The eGFR has been calculated using the CKD EPI equation. This calculation has not been validated in all clinical situations. eGFR's persistently <60 mL/min signify possible Chronic Kidney Disease.    Anion gap 10 5 - 15  CBC     Status: Abnormal   Collection Time: 12/25/16  3:35 AM  Result Value Ref Range   WBC 13.5 (H) 4.0 - 10.5 K/uL   RBC 4.37 3.87 - 5.11 MIL/uL   Hemoglobin 12.1 12.0 - 15.0 g/dL   HCT 36.2 36.0 - 46.0 %   MCV 82.8 78.0 - 100.0 fL   MCH 27.7 26.0 - 34.0 pg   MCHC 33.4 30.0 - 36.0 g/dL   RDW 13.9 11.5 - 15.5 %   Platelets 395 150 - 400 K/uL  Urinalysis, Routine w reflex microscopic     Status: Abnormal   Collection Time: 12/25/16  3:35 AM  Result Value Ref Range   Color, Urine  YELLOW YELLOW   APPearance CLEAR CLEAR   Specific Gravity, Urine 1.027 1.005 - 1.030   pH 5.0 5.0 - 8.0   Glucose, UA NEGATIVE NEGATIVE mg/dL   Hgb urine dipstick SMALL (A) NEGATIVE   Bilirubin Urine NEGATIVE NEGATIVE   Ketones, ur 5 (A) NEGATIVE mg/dL   Protein, ur NEGATIVE NEGATIVE mg/dL   Nitrite NEGATIVE NEGATIVE   Leukocytes, UA NEGATIVE NEGATIVE   RBC / HPF 0-5 0 - 5 RBC/hpf   WBC, UA 0-5 0 - 5 WBC/hpf   Bacteria, UA NONE SEEN NONE SEEN   Squamous Epithelial / LPF 0-5 (A) NONE SEEN   Mucous PRESENT     US Abdomen Limited Ruq  Result Date: 12/25/2016 CLINICAL DATA:  Chronic right upper quadrant abdominal pain, nausea and diarrhea. Initial encounter. EXAM: ULTRASOUND ABDOMEN LIMITED RIGHT UPPER QUADRANT COMPARISON:  CT of the abdomen and pelvis performed 08/22/2013 FINDINGS: Gallbladder: A large 2.0 cm stone is noted within the gallbladder. Mild gallbladder wall thickening is noted. Ring down artifact along the gallbladder wall likely reflects adenomyomatosis. A positive ultrasonographic Murphy's sign may reflect mild cholecystitis. No pericholecystic fluid is seen. Common bile duct: Diameter: 0.7 cm, borderline prominent. Liver: No focal lesion identified. Increased parenchymal echogenicity and coarsened echotexture may reflect fatty infiltration. IMPRESSION: 1. Positive ultrasonographic Murphy's sign, with mild gallbladder wall thickening, possibly reflecting mild cholecystitis. Borderline prominence of the common bile duct. 2. Cholelithiasis noted. Ring down artifact along the gallbladder wall likely reflects adenomyomatosis. 3. Mild fatty infiltration within the liver. Electronically Signed   By: Garald Balding M.D.   On: 12/25/2016 05:12    Review of Systems  Constitutional: Positive for malaise/fatigue. Negative for chills and fever.  HENT: Negative for hearing loss.   Eyes: Negative for blurred vision and double vision.  Respiratory: Negative for cough and hemoptysis.     Cardiovascular: Negative for chest pain and palpitations.  Gastrointestinal: Positive for abdominal pain, nausea and vomiting.  Genitourinary: Negative for dysuria and urgency.  Musculoskeletal: Negative for myalgias and neck pain.  Skin: Negative for itching and rash.  Neurological: Negative for dizziness, tingling and headaches.  Endo/Heme/Allergies: Does not bruise/bleed easily.  Psychiatric/Behavioral: Negative for depression and suicidal ideas.   Blood pressure 110/66, pulse 84, temperature (!) 97.5 F (36.4 C), temperature source Oral, resp. rate 20, height 5' (1.524  m), weight 62.1 kg (137 lb), SpO2 100 %. Physical Exam  Vitals reviewed. Constitutional: She is oriented to person, place, and time. She appears well-developed and well-nourished.  HENT:  Head: Normocephalic and atraumatic.  Eyes: Pupils are equal, round, and reactive to light. Conjunctivae and EOM are normal.  Neck: Normal range of motion. Neck supple.  Cardiovascular: Normal rate and regular rhythm.   Respiratory: Effort normal and breath sounds normal.  GI: Soft. Bowel sounds are normal. She exhibits no distension. There is tenderness in the right upper quadrant.  Musculoskeletal: Normal range of motion.  Neurological: She is alert and oriented to person, place, and time.  Skin: Skin is warm and dry.  Psychiatric: She has a normal mood and affect. Her behavior is normal.      Assessment/Plan: 56 yo female with acute calculous cholecystitis -observe -We discussed the etiology of her pain, we discussed treatment options and recommended surgery. We discussed details of surgery including general anesthesia, laparoscopic approach, identification of cystic duct and common bile duct. Ligation of cystic duct and cystic artery. Possible need for intraoperative cholangiogram or open procedure. Possible risks of common bile duct injury, liver injury, cystic duct leak, bleeding, infection, post-cholecystectomy syndrome. The  patient showed good understanding and all questions were answered -plan for lap chole while here  Hannah [name] 12/25/2016, 6:20 AM

## 2016-12-25 NOTE — Transfer of Care (Addendum)
Immediate Anesthesia Transfer of Care Note  Patient: Hannah [name]  Procedure(s) Performed: Procedure(s): LYSIS OF ADHESIONS, LAPAROSCOPIC CHOLECYSTECTOMY WITH INTRAOPERATIVE CHOLANGIOGRAM (N/A)  Patient Location: PACU  Anesthesia Type:General  Level of Consciousness: awake and alert   Airway & Oxygen Therapy: Patient Spontanous Breathing and Patient connected to face mask oxygen  Post-op Assessment: Report given to RN and Post -op Vital signs reviewed and stable  Post vital signs: Reviewed and stable  Last Vitals:  Vitals:   12/25/16 0316 12/25/16 0621  BP: 110/66 121/70  Pulse: 84 81  Resp: 20 16  Temp: (!) 36.4 C     Last Pain:  Vitals:   12/25/16 0419  TempSrc:   PainSc: 9          Complications: No apparent anesthesia complications

## 2016-12-25 NOTE — ED Notes (Addendum)
Ultrasound complete 

## 2016-12-25 NOTE — ED Notes (Signed)
Ultrasound in room

## 2016-12-26 LAB — POCT I-STAT 4, (NA,K, GLUC, HGB,HCT)
GLUCOSE: 151 mg/dL — AB (ref 65–99)
HCT: 33 % — ABNORMAL LOW (ref 36.0–46.0)
Hemoglobin: 11.2 g/dL — ABNORMAL LOW (ref 12.0–15.0)
Potassium: 3.9 mmol/L (ref 3.5–5.1)
Sodium: 139 mmol/L (ref 135–145)

## 2016-12-26 LAB — CBC
HCT: 29.5 % — ABNORMAL LOW (ref 36.0–46.0)
Hemoglobin: 9.9 g/dL — ABNORMAL LOW (ref 12.0–15.0)
MCH: 27.2 pg (ref 26.0–34.0)
MCHC: 33.6 g/dL (ref 30.0–36.0)
MCV: 81 fL (ref 78.0–100.0)
PLATELETS: 315 10*3/uL (ref 150–400)
RBC: 3.64 MIL/uL — AB (ref 3.87–5.11)
RDW: 14 % (ref 11.5–15.5)
WBC: 11.2 10*3/uL — ABNORMAL HIGH (ref 4.0–10.5)

## 2016-12-26 LAB — COMPREHENSIVE METABOLIC PANEL
ALT: 60 U/L — ABNORMAL HIGH (ref 14–54)
ANION GAP: 6 (ref 5–15)
AST: 58 U/L — ABNORMAL HIGH (ref 15–41)
Albumin: 3.3 g/dL — ABNORMAL LOW (ref 3.5–5.0)
Alkaline Phosphatase: 65 U/L (ref 38–126)
BUN: 10 mg/dL (ref 6–20)
CALCIUM: 8.4 mg/dL — AB (ref 8.9–10.3)
CO2: 30 mmol/L (ref 22–32)
CREATININE: 0.66 mg/dL (ref 0.44–1.00)
Chloride: 102 mmol/L (ref 101–111)
GFR calc non Af Amer: >60 mL/min (ref >60)
Glucose, Bld: 137 mg/dL — ABNORMAL HIGH (ref 65–99)
Potassium: 3.5 mmol/L (ref 3.5–5.1)
SODIUM: 138 mmol/L (ref 135–145)
Total Bilirubin: 0.4 mg/dL (ref 0.3–1.2)
Total Protein: 5.9 g/dL — ABNORMAL LOW (ref 6.5–8.1)

## 2016-12-26 LAB — GLUCOSE, CAPILLARY
GLUCOSE-CAPILLARY: 153 mg/dL — AB (ref 65–99)
Glucose-Capillary: 148 mg/dL — ABNORMAL HIGH (ref 65–99)

## 2016-12-26 LAB — HIV ANTIBODY (ROUTINE TESTING W REFLEX): HIV SCREEN 4TH GENERATION: NONREACTIVE

## 2016-12-26 MED ORDER — ACETAMINOPHEN 325 MG PO TABS
650.0000 mg | ORAL_TABLET | Freq: Four times a day (QID) | ORAL | Status: DC
  Administered 2016-12-26: 650 mg via ORAL
  Filled 2016-12-26: qty 2

## 2016-12-26 MED ORDER — TRAMADOL HCL 50 MG PO TABS: 50.0000 mg | ORAL_TABLET | Freq: Four times a day (QID) | ORAL | Status: DC | PRN

## 2016-12-26 MED ORDER — OXYCODONE HCL 5 MG PO TABS: 5.0000 mg | ORAL_TABLET | ORAL | 0 refills | Status: DC | PRN

## 2016-12-26 MED ORDER — ACETAMINOPHEN 325 MG PO TABS: 650.0000 mg | ORAL_TABLET | Freq: Four times a day (QID) | ORAL | Status: DC | PRN

## 2016-12-26 NOTE — Progress Notes (Signed)
Pt was discharged home today. Instructions were reviewed with patient, and questions were answered. Pt was taken to main entrance via wheelchair by NT.  

## 2016-12-26 NOTE — Progress Notes (Signed)
Spoke with patient at bedside. Patient states she sees a Designer, jewellery at [facility], [place]. Updated this in system. She has some difficulty with medications, has some at home, typically gets 90 day refills. Review current meds, patient is already on generic and low cost meds (total cost about $ 40). She is familiar with orange card program, she states she had insurance then lost it and was using her orange card. She then got another job and made too much to qualify, she has since lost her job. She plans to reapply for the orange card at d/c. Provided patient with resources for medication and community resources as well. No further d/c needs assessed.

## 2016-12-26 NOTE — Progress Notes (Signed)
[facility] Progress Note  1 Day Post-Op  Subjective: CC:  Reports drinking small amount broth post-operatively yesterday, awaiting her breakfast. States she "over did it" walking this AM and is in a lot of pain. Denies nausea or vomiting. Urinating without hesitancy.   Objective: Vital signs in last 24 hours: Temp:  [97.8 F (36.6 C)-99.2 F (37.3 C)] 98.3 F (36.8 C) (08/07 0510) Pulse Rate:  [74-96] 74 (08/07 0510) Resp:  [12-18] 18 (08/07 0510) BP: (108-142)/(58-84) 108/58 (08/07 0510) SpO2:  [95 %-100 %] 95 % (08/07 0510) FiO2 (%):  [100 %] 100 % (08/06 1204) Last BM Date: 12/25/16  Intake/Output from previous day: 08/06 0701 - 08/07 0700 In: 2730 [P.O.:480; I.V.:2250] Out: 1450 [Urine:1400; Blood:50] Intake/Output this shift: Total I/O In: -  Out: 300 [Urine:300]  PE: Gen:  Alert, NAD, pleasant Card:  Regular rate and rhythm Pulm:  Normal effort Abd: Soft, appropriately tender, mild distention,  incisions C/D/I Skin: warm and dry, no rashes  Psych: A&Ox3   Lab Results:   Recent Labs  12/25/16 0335 12/25/16 0857 12/26/16 0529  WBC 13.5*  --  11.2*  HGB 12.1 11.2* 9.9*  HCT 36.2 33.0* 29.5*  PLT 395  --  315   BMET  Recent Labs  12/25/16 0335 12/25/16 0857 12/26/16 0529  NA 138 139 138  K 2.9* 3.9 3.5  CL 97*  --  102  CO2 31  --  30  GLUCOSE 216* 151* 137*  BUN 22*  --  10  CREATININE 0.80  --  0.66  CALCIUM 9.0  --  8.4*   PT/INR No results for input(s): LABPROT, INR in the last 72 hours. CMP     Component Value Date/Time   NA 138 12/26/2016 0529   K 3.5 12/26/2016 0529   CL 102 12/26/2016 0529   CO2 30 12/26/2016 0529   GLUCOSE 137 (H) 12/26/2016 0529   BUN 10 12/26/2016 0529   CREATININE 0.66 12/26/2016 0529   CALCIUM 8.4 (L) 12/26/2016 0529   PROT 5.9 (L) 12/26/2016 0529   ALBUMIN 3.3 (L) 12/26/2016 0529   AST 58 (H) 12/26/2016 0529   ALT 60 (H) 12/26/2016 0529   ALKPHOS 65 12/26/2016 0529   BILITOT 0.4  12/26/2016 0529   GFRNONAA >60 12/26/2016 0529   GFRAA >60 12/26/2016 0529   Lipase     Component Value Date/Time   LIPASE 24 12/25/2016 0335       Studies/Results: Dg Cholangiogram Operative  Result Date: 12/25/2016 CLINICAL DATA:  Intraoperative cholangiogram during laparoscopic cholecystectomy. EXAM: INTRAOPERATIVE CHOLANGIOGRAM FLUOROSCOPY TIME:  12 seconds (5.38 MGy) COMPARISON:  Right upper quadrant abdominal ultrasound -12/25/2016 FINDINGS: Intraoperative cholangiographic images of the right upper abdominal quadrant during laparoscopic cholecystectomy are provided for review. Surgical clips overlie the expected location of the gallbladder fossa. Contrast injection demonstrates selective cannulation of the central aspect of the cystic duct. There is passage of contrast through the central aspect of the cystic duct with filling of a non dilated common bile duct. There is passage of contrast though the CBD and into the descending portion of the duodenum. There is minimal reflux of injected contrast into the common hepatic duct and central aspect of the non dilated intrahepatic biliary system. There is minimal opacification the central aspect of the pancreatic duct which appears nondilated. There are no discrete filling defects within the opacified portions of the biliary system to suggest the presence of choledocholithiasis. IMPRESSION: No evidence of choledocholithiasis. Electronically Signed   By:  Sandi Mariscal M.D.   On: 12/25/2016 10:30   US Abdomen Limited Ruq  Result Date: 12/25/2016 CLINICAL DATA:  Chronic right upper quadrant abdominal pain, nausea and diarrhea. Initial encounter. EXAM: ULTRASOUND ABDOMEN LIMITED RIGHT UPPER QUADRANT COMPARISON:  CT of the abdomen and pelvis performed 08/22/2013 FINDINGS: Gallbladder: A large 2.0 cm stone is noted within the gallbladder. Mild gallbladder wall thickening is noted. Ring down artifact along the gallbladder wall likely reflects  adenomyomatosis. A positive ultrasonographic Murphy's sign may reflect mild cholecystitis. No pericholecystic fluid is seen. Common bile duct: Diameter: 0.7 cm, borderline prominent. Liver: No focal lesion identified. Increased parenchymal echogenicity and coarsened echotexture may reflect fatty infiltration. IMPRESSION: 1. Positive ultrasonographic Murphy's sign, with mild gallbladder wall thickening, possibly reflecting mild cholecystitis. Borderline prominence of the common bile duct. 2. Cholelithiasis noted. Ring down artifact along the gallbladder wall likely reflects adenomyomatosis. 3. Mild fatty infiltration within the liver. Electronically Signed   By: Garald Balding M.D.   On: 12/25/2016 05:12    Anti-infectives: Anti-infectives    Start     Dose/Rate Route Frequency Ordered Stop   12/26/16 0600  cefTRIAXone (ROCEPHIN) 2 g in dextrose 5 % 50 mL IVPB  Status:  Discontinued     2 g 100 mL/hr over 30 Minutes Intravenous Every 24 hours 12/25/16 0703 12/25/16 1213   12/25/16 0530  cefTRIAXone (ROCEPHIN) 2 g in dextrose 5 % 50 mL IVPB     2 g 100 mL/hr over 30 Minutes Intravenous  Once 12/25/16 0521 12/25/16 6837       Assessment/Plan Acute calculous cholecystitis POD#1 S/P laparoscopic cholecystectomy with IOC and LOA 12/25/16 Dr. Greer Pickerel  - patient afebrile, VSS - advance diet this AM to carb mod and work on PO pain control - OOB/mobilize/IS - will re-check this afternoon for possible PM discharge   FEN: carb mod ID: perioperative rocephin VTE: Lovenox, SCD's  Dispo: possible PM discharge.    LOS: 0 days    Jill Alexanders , [facility] 12/26/2016, 10:05 AM Pager: [phone] Consults: [phone] Mon-Fri 7:00 am-4:30 pm Sat-Sun 7:00 am-11:30 am

## 2016-12-26 NOTE — Discharge Summary (Signed)
[facility] Discharge Summary   Patient ID: Hannah [name] MRN: [id number] DOB/AGE: July 30, 1960 56 y.o.  Admit date: 12/25/2016 Discharge date: 12/26/2016  Discharge Diagnosis Patient Active Problem List   Diagnosis Date Noted  . Acute cholecystitis due to biliary calculus 12/25/2016   Imaging: Dg Cholangiogram Operative  Result Date: 12/25/2016 CLINICAL DATA:  Intraoperative cholangiogram during laparoscopic cholecystectomy. EXAM: INTRAOPERATIVE CHOLANGIOGRAM FLUOROSCOPY TIME:  12 seconds (5.38 MGy) COMPARISON:  Right upper quadrant abdominal ultrasound -12/25/2016 FINDINGS: Intraoperative cholangiographic images of the right upper abdominal quadrant during laparoscopic cholecystectomy are provided for review. Surgical clips overlie the expected location of the gallbladder fossa. Contrast injection demonstrates selective cannulation of the central aspect of the cystic duct. There is passage of contrast through the central aspect of the cystic duct with filling of a non dilated common bile duct. There is passage of contrast though the CBD and into the descending portion of the duodenum. There is minimal reflux of injected contrast into the common hepatic duct and central aspect of the non dilated intrahepatic biliary system. There is minimal opacification the central aspect of the pancreatic duct which appears nondilated. There are no discrete filling defects within the opacified portions of the biliary system to suggest the presence of choledocholithiasis. IMPRESSION: No evidence of choledocholithiasis. Electronically Signed   By: Sandi Mariscal M.D.   On: 12/25/2016 10:30   US Abdomen Limited Ruq  Result Date: 12/25/2016 CLINICAL DATA:  Chronic right upper quadrant abdominal pain, nausea and diarrhea. Initial encounter. EXAM: ULTRASOUND ABDOMEN LIMITED RIGHT UPPER QUADRANT COMPARISON:  CT of the abdomen and pelvis performed 08/22/2013 FINDINGS: Gallbladder: A large 2.0 cm stone is noted  within the gallbladder. Mild gallbladder wall thickening is noted. Ring down artifact along the gallbladder wall likely reflects adenomyomatosis. A positive ultrasonographic Murphy's sign may reflect mild cholecystitis. No pericholecystic fluid is seen. Common bile duct: Diameter: 0.7 cm, borderline prominent. Liver: No focal lesion identified. Increased parenchymal echogenicity and coarsened echotexture may reflect fatty infiltration. IMPRESSION: 1. Positive ultrasonographic Murphy's sign, with mild gallbladder wall thickening, possibly reflecting mild cholecystitis. Borderline prominence of the common bile duct. 2. Cholelithiasis noted. Ring down artifact along the gallbladder wall likely reflects adenomyomatosis. 3. Mild fatty infiltration within the liver. Electronically Signed   By: Garald Balding M.D.   On: 12/25/2016 05:12    Procedures Dr. Greer Pickerel (12/25/16) - Laparoscopic Cholecystectomy with [place] Hospital Course:  56 y.o. African-American female who presented to [facility] emergency department complaining of worsening upper abdominal pain. She reports a history of 3 months of intermittent right upper quadrant pain that is becoming increasingly frequent and worse after meals. Associated symptoms including nausea, vomiting, and diarrhea. Workup in the emergency Department significant for right upper quadrant ultrasound revealing cholelithiasis, gallbladder wall thickening, and positive Murphy sign. White blood cell count 13,500. The patient was admitted and underwent the above procedure by Dr. Greer Pickerel. Intraoperative cholangiogram was negative for filling defect of the common bile duct. On postoperative day #1 the patient's vitals were stable, pain controlled, tolerating oral intake, mobilizing, and medically stable for discharge home. She will require outpatient follow-up in our [place] office in 2-3 weeks and knows to call with questions or concerns  Allergies as of 12/26/2016      Reactions    Hydrochlorothiazide Other (See Comments)   Reaction:  Muscle spasms   Latex Hives, Itching      Medication List    STOP taking these medications   oxyCODONE-acetaminophen 5-325 MG tablet  Commonly known as:  ROXICET     TAKE these medications   acetaminophen 325 MG tablet Commonly known as:  TYLENOL Take 2 tablets (650 mg total) by mouth every 6 (six) hours as needed.   amLODipine 5 MG tablet Commonly known as:  NORVASC Take 5 mg by mouth daily.   BIOTIN PO Take 1 tablet by mouth daily.   chlorthalidone 25 MG tablet Commonly known as:  HYGROTON Take 25 mg by mouth daily.   JENTADUETO 2.09-998 MG Tabs Generic drug:  Linagliptin-Metformin HCl Take 1 tablet by mouth 2 (two) times daily.   lisinopril 40 MG tablet Commonly known as:  PRINIVIL,ZESTRIL Take 40 mg by mouth daily.   oxyCODONE 5 MG immediate release tablet Commonly known as:  Oxy IR/ROXICODONE Take 1-2 tablets (5-10 mg total) by mouth every 4 (four) hours as needed for moderate pain.   VITAMIN B-3 PO Take 1 tablet by mouth daily.      Follow-up [facility] Follow up.   Specialty:  General Surgery Why:  call as soon as possible to confirm post-operative appointment date/time. Contact information: [address] [place] [phone]       Derrill Center, NP. Schedule an appointment as soon as possible for a visit.   Specialty:  Nurse Practitioner Contact information: [address] [place] [address] [phone]          Signed: Obie Dredge, [facility] 12/26/2016, 2:34 PM Pager: [phone] Consults: [phone] Mon-Fri 7:00 am-4:30 pm Sat-Sun 7:00 am-11:30 am

## 2016-12-26 NOTE — Discharge Instructions (Signed)

## 2017-01-02 ENCOUNTER — Encounter (HOSPITAL_COMMUNITY): Payer: Self-pay | Admitting: Anesthesiology

## 2017-03-01 ENCOUNTER — Other Ambulatory Visit: Payer: Self-pay

## 2017-03-01 ENCOUNTER — Encounter (HOSPITAL_COMMUNITY): Payer: Self-pay | Admitting: Emergency Medicine

## 2017-03-01 ENCOUNTER — Emergency Department (HOSPITAL_COMMUNITY)
Admission: EM | Admit: 2017-03-01 | Discharge: 2017-03-02 | Disposition: A | Discharge status: Home / Self Care | Payer: Self-pay | Attending: Emergency Medicine | Admitting: Emergency Medicine

## 2017-03-01 DIAGNOSIS — R55 Syncope and collapse: Secondary | ICD-10-CM | POA: Insufficient documentation

## 2017-03-01 DIAGNOSIS — R531 Weakness: Secondary | ICD-10-CM | POA: Diagnosis not present

## 2017-03-01 DIAGNOSIS — E119 Type 2 diabetes mellitus without complications: Secondary | ICD-10-CM | POA: Insufficient documentation

## 2017-03-01 DIAGNOSIS — I10 Essential (primary) hypertension: Secondary | ICD-10-CM | POA: Insufficient documentation

## 2017-03-01 DIAGNOSIS — R404 Transient alteration of awareness: Secondary | ICD-10-CM | POA: Diagnosis not present

## 2017-03-01 DIAGNOSIS — Z79899 Other long term (current) drug therapy: Secondary | ICD-10-CM | POA: Insufficient documentation

## 2017-03-01 DIAGNOSIS — F17210 Nicotine dependence, cigarettes, uncomplicated: Secondary | ICD-10-CM | POA: Insufficient documentation

## 2017-03-01 DIAGNOSIS — Z91040 Latex allergy status: Secondary | ICD-10-CM | POA: Insufficient documentation

## 2017-03-01 LAB — CBC
HCT: 35.8 % — ABNORMAL LOW (ref 36.0–46.0)
Hemoglobin: 11.8 g/dL — ABNORMAL LOW (ref 12.0–15.0)
MCH: 27.4 pg (ref 26.0–34.0)
MCHC: 33 g/dL (ref 30.0–36.0)
MCV: 83.3 fL (ref 78.0–100.0)
PLATELETS: 358 10*3/uL (ref 150–400)
RBC: 4.3 MIL/uL (ref 3.87–5.11)
RDW: 14 % (ref 11.5–15.5)
WBC: 16.3 10*3/uL — AB (ref 4.0–10.5)

## 2017-03-01 LAB — CBG MONITORING, ED: GLUCOSE-CAPILLARY: 148 mg/dL — AB (ref 65–99)

## 2017-03-01 MED ORDER — SODIUM CHLORIDE 0.9 % IV SOLN
INTRAVENOUS | Status: DC
  Administered 2017-03-02: via INTRAVENOUS

## 2017-03-01 MED ORDER — SODIUM CHLORIDE 0.9 % IV BOLUS (SEPSIS)
1000.0000 mL | Freq: Once | INTRAVENOUS | Status: AC
  Administered 2017-03-01: 1000 mL via INTRAVENOUS

## 2017-03-01 NOTE — ED Triage Notes (Signed)
Per Pt she was at an [employer] and felt lightheaded, sweaty, and the passed out and hit trash can with her head.  She remembers standing up to go to the bathroom and then she awoke to people asking her if she was ok.  Per EMS her intial CBG was 154.  She is a type 2 diabetes and has a history of Hypertension. Currently she is resting in bed and reports no pain at this time.  NAD noted at this time.

## 2017-03-01 NOTE — ED Provider Notes (Signed)
[facility] DEPT Provider Note   CSN: [id number] Arrival date & time: 03/01/17  2310     History   Chief Complaint Chief Complaint  Patient presents with  . Near Syncope    HPI Hannah [name] is a 56 y.o. female.  56 year old female presents after a syncopal episode just prior to arrival. States that she was standing up plan again when shehad abdominal cramping and flushes have diarrhea. She attempted to go to the bathroom but then lightheaded and dizzy and passed out. This was witnessed by her friends and there was no seizure activity. She was only unconscious for several seconds. She distract her head but denies any neck pain. No visual changes. No weakness in upper lower extremity. Denies alcohol use this evening. She does have a history of diabetes and her CBG was 154. Did suffer an abrasion to the left side of her head. No other complaints of at this time. Was transported by EMS      Past Medical History:  Diagnosis Date  . Arthritis    feet  . Diabetes mellitus    type 2  . Dysrhythmia   . Family history of adverse reaction to anesthesia    father: post-op nausea and vomiting  . Heart murmur   . Hypertension   . Wears glasses   . Wears partial dentures    partial upper    Patient Active Problem List   Diagnosis Date Noted  . Acute cholecystitis due to biliary calculus 12/25/2016  . LOCALIZED SUPERFICIAL SWELLING MASS OR LUMP 03/26/2007  . VAGINAL DISCHARGE 10/05/2006  . ABDOMINAL PAIN, CHRONIC 06/08/2006  . TOBACCO ABUSE 04/27/2006  . HYPERTENSION 04/27/2006  . HEMORRHOIDS 04/27/2006  . FISTULA, ANAL 04/27/2006  . PALPITATIONS 04/27/2006  . BREAST CYST, HX OF 04/27/2006    Past Surgical History:  Procedure Laterality Date  . ABDOMINAL HYSTERECTOMY     in her 52s  . BREAST SURGERY  2 different occasions, over 5 years ago   cyst removal BIL  . CHOLECYSTECTOMY N/A 12/25/2016   Procedure: LYSIS OF ADHESIONS, LAPAROSCOPIC CHOLECYSTECTOMY WITH  INTRAOPERATIVE CHOLANGIOGRAM;  Surgeon: Greer Pickerel, MD;  Location: [facility];  Service: General;  Laterality: N/A;  . COLON SURGERY  at least 6 yrs ago per patient   polyps and fissures   . DIAGNOSTIC LAPAROSCOPY  over 20 years ago   exploratory lap  . [place]   left  . INSERTION OF MESH N/A 11/13/2014   Procedure: INSERTION OF MESH;  Surgeon: Ralene Ok, MD;  Location: [facility];  Service: General;  Laterality: N/A;  . MICROLARYNGOSCOPY N/A 05/19/2014   Procedure: MICROLARYNGOSCOPY WITH EXCISION OF VOCAL CORD LESION;  Surgeon: Rozetta Nunnery, MD;  Location: [facility];  Service: ENT;  Laterality: N/A;  . rectal fissure    . UMBILICAL HERNIA REPAIR N/A 11/13/2014   Procedure: LAPAROSCOPIC UMBILICAL HERNIA REPAIR WITH MESH;  Surgeon: Ralene Ok, MD;  Location: [facility];  Service: General;  Laterality: N/A;    OB History    No data available       Home Medications    Prior to Admission medications   Medication Sig Start Date End Date Taking? Authorizing Provider  acetaminophen (TYLENOL) 325 MG tablet Take 2 tablets (650 mg total) by mouth every 6 (six) hours as needed. 12/26/16   Jill Alexanders, PA-C  amLODipine (NORVASC) 5 MG tablet Take 5 mg by mouth daily.    [provider]  BIOTIN PO Take  1 tablet by mouth daily.    [provider]  chlorthalidone (HYGROTON) 25 MG tablet Take 25 mg by mouth daily.     [provider]  Linagliptin-Metformin HCl (JENTADUETO) 2.09-998 MG TABS Take 1 tablet by mouth 2 (two) times daily.     [provider]  lisinopril (PRINIVIL,ZESTRIL) 40 MG tablet Take 40 mg by mouth daily.    [provider]  Niacin (VITAMIN B-3 PO) Take 1 tablet by mouth daily.    [provider]  oxyCODONE (OXY IR/ROXICODONE) 5 MG immediate release tablet Take 1-2 tablets (5-10 mg total) by mouth every 4 (four) hours as needed for moderate pain. 12/26/16   Jill Alexanders, PA-C    Family  History History reviewed. No pertinent family history.  Social History Social History  Substance Use Topics  . Smoking status: Current Every Day Smoker    Packs/day: 1.00    Years: 44.00    Types: Cigarettes  . Smokeless tobacco: Never Used  . Alcohol use No     Allergies   Hydrochlorothiazide and Latex   Review of Systems Review of Systems  All other systems reviewed and are negative.    Physical Exam Updated Vital Signs BP (!) 131/99 (BP Location: Right Arm)   Pulse 84   Temp 97.6 F (36.4 C) (Oral)   Resp 18   Ht 1.524 m (5')   Wt 62.6 kg (138 lb)   SpO2 100%   BMI 26.95 kg/m   Physical Exam  Constitutional: She is oriented to person, place, and time. She appears well-developed and well-nourished.  Non-toxic appearance. No distress.  HENT:  Head: Normocephalic and atraumatic.    Eyes: Pupils are equal, round, and reactive to light. Conjunctivae, EOM and lids are normal.  Neck: Normal range of motion. Neck supple. Muscular tenderness present. No spinous process tenderness present. No tracheal deviation present. No thyroid mass present.  Cardiovascular: Normal rate, regular rhythm and normal heart sounds.  Exam reveals no gallop.   No murmur heard. Pulmonary/Chest: Effort normal and breath sounds normal. No stridor. No respiratory distress. She has no decreased breath sounds. She has no wheezes. She has no rhonchi. She has no rales.  Abdominal: Soft. Normal appearance and bowel sounds are normal. She exhibits no distension. There is no tenderness. There is no rebound and no CVA tenderness.  Musculoskeletal: Normal range of motion. She exhibits no edema or tenderness.  Neurological: She is alert and oriented to person, place, and time. She has normal strength. No cranial nerve deficit or sensory deficit. GCS eye subscore is 4. GCS verbal subscore is 5. GCS motor subscore is 6.  Skin: Skin is warm and dry. No abrasion and no rash noted.  Psychiatric: She has a  normal mood and affect. Her speech is normal and behavior is normal.  Nursing note and vitals reviewed.    ED Treatments / Results  Labs (all labs ordered are listed, but only abnormal results are displayed) Labs Reviewed  CBG MONITORING, ED - Abnormal; Notable for the following:       Result Value   Glucose-Capillary 148 (*)    All other components within normal limits  BASIC METABOLIC PANEL  CBC  URINALYSIS, ROUTINE W REFLEX MICROSCOPIC  RAPID URINE DRUG SCREEN, HOSP PERFORMED  TROPONIN I    EKG  EKG Interpretation  Date/Time:  Thursday March 01 2017 23:10:14 EDT Ventricular Rate:  82 PR Interval:  190 QRS Duration: 80 QT Interval:  398 QTC Calculation:  464 R Axis:   58 Text Interpretation:  Normal sinus rhythm Normal ECG No significant change since last tracing Confirmed by Lacretia Leigh ([id number]) on 03/01/2017 11:46:24 PM       Radiology No results found.  Procedures Procedures (including critical care time)  Medications Ordered in ED Medications  sodium chloride 0.9 % bolus 1,000 mL (not administered)  0.9 %  sodium chloride infusion (not administered)     Initial Impression / Assessment and Plan / ED Course  I have reviewed the triage vital signs and the nursing notes.  Pertinent labs & imaging results that were available during my care of the patient were reviewed by me and considered in my medical decision making (see chart for details).     Patient CT of her head and C-spine were negative for fracture. She is given IV hydration here. Urinalysis negative for infection. She was flu test negative. She was mildly orthostatic. Patient had been ambulatory without being dizzy at this time and suspect vagal episode and will discharge home  Final Clinical Impressions(s) / ED Diagnoses   Final diagnoses:  None    New Prescriptions New Prescriptions   No medications on file     Lacretia Leigh, MD 03/02/17 [phone]

## 2017-03-02 ENCOUNTER — Emergency Department (HOSPITAL_COMMUNITY): Payer: Self-pay

## 2017-03-02 LAB — URINALYSIS, ROUTINE W REFLEX MICROSCOPIC
Bilirubin Urine: NEGATIVE
Glucose, UA: NEGATIVE mg/dL
KETONES UR: NEGATIVE mg/dL
LEUKOCYTES UA: NEGATIVE
Nitrite: NEGATIVE
PH: 6 (ref 5.0–8.0)
Protein, ur: NEGATIVE mg/dL
SPECIFIC GRAVITY, URINE: 1.003 — AB (ref 1.005–1.030)
SQUAMOUS EPITHELIAL / LPF: NONE SEEN

## 2017-03-02 LAB — RAPID URINE DRUG SCREEN, HOSP PERFORMED
Amphetamines: NOT DETECTED
BARBITURATES: NOT DETECTED
BENZODIAZEPINES: NOT DETECTED
COCAINE: NOT DETECTED
Opiates: NOT DETECTED
Tetrahydrocannabinol: NOT DETECTED

## 2017-03-02 LAB — BASIC METABOLIC PANEL
Anion gap: 11 (ref 5–15)
BUN: 13 mg/dL (ref 6–20)
CALCIUM: 8.6 mg/dL — AB (ref 8.9–10.3)
CO2: 24 mmol/L (ref 22–32)
CREATININE: 0.71 mg/dL (ref 0.44–1.00)
Chloride: 99 mmol/L — ABNORMAL LOW (ref 101–111)
GFR calc Af Amer: >60 mL/min (ref >60)
GFR calc non Af Amer: >60 mL/min (ref >60)
GLUCOSE: 155 mg/dL — AB (ref 65–99)
Potassium: 3.3 mmol/L — ABNORMAL LOW (ref 3.5–5.1)
Sodium: 134 mmol/L — ABNORMAL LOW (ref 135–145)

## 2017-03-02 LAB — TROPONIN I: Troponin I: <0.03 ng/mL (ref <0.03)

## 2017-03-02 LAB — INFLUENZA PANEL BY PCR (TYPE A & B)
Influenza A By PCR: NEGATIVE
Influenza B By PCR: NEGATIVE

## 2017-03-02 NOTE — ED Notes (Addendum)
Patient ambulatory, denies any pain at this time

## 2017-03-02 NOTE — ED Notes (Signed)
Patient Alert and oriented X4. Stable and ambulatory. Patient verbalized understanding of the discharge instructions.  Patient belongings were taken by the patient.  

## 2018-01-02 DIAGNOSIS — M461 Sacroiliitis, not elsewhere classified: Secondary | ICD-10-CM | POA: Diagnosis not present

## 2018-01-02 DIAGNOSIS — E785 Hyperlipidemia, unspecified: Secondary | ICD-10-CM | POA: Diagnosis not present

## 2018-01-02 DIAGNOSIS — E1165 Type 2 diabetes mellitus with hyperglycemia: Secondary | ICD-10-CM | POA: Diagnosis not present

## 2018-01-02 DIAGNOSIS — I10 Essential (primary) hypertension: Secondary | ICD-10-CM | POA: Diagnosis not present

## 2018-01-09 ENCOUNTER — Other Ambulatory Visit (HOSPITAL_COMMUNITY)
Admission: RE | Admit: 2018-01-09 | Discharge: 2018-01-09 | Disposition: A | Discharge status: Home / Self Care | Payer: BLUE CROSS/BLUE SHIELD | Source: Ambulatory Visit | Attending: Family Medicine | Admitting: Family Medicine

## 2018-01-09 ENCOUNTER — Other Ambulatory Visit: Payer: Self-pay | Admitting: Family Medicine

## 2018-01-09 DIAGNOSIS — Z01411 Encounter for gynecological examination (general) (routine) with abnormal findings: Secondary | ICD-10-CM | POA: Diagnosis not present

## 2018-01-09 DIAGNOSIS — Z6827 Body mass index (BMI) 27.0-27.9, adult: Secondary | ICD-10-CM | POA: Diagnosis not present

## 2018-01-09 DIAGNOSIS — Z0000 Encounter for general adult medical examination without abnormal findings: Secondary | ICD-10-CM | POA: Diagnosis not present

## 2018-01-10 ENCOUNTER — Ambulatory Visit
Admission: RE | Admit: 2018-01-10 | Discharge: 2018-01-10 | Disposition: A | Discharge status: Home / Self Care | Payer: BLUE CROSS/BLUE SHIELD | Source: Ambulatory Visit | Attending: Family Medicine | Admitting: Family Medicine

## 2018-01-10 ENCOUNTER — Other Ambulatory Visit: Payer: Self-pay | Admitting: Family Medicine

## 2018-01-10 DIAGNOSIS — M25522 Pain in left elbow: Secondary | ICD-10-CM | POA: Diagnosis not present

## 2018-01-11 DIAGNOSIS — Z1231 Encounter for screening mammogram for malignant neoplasm of breast: Secondary | ICD-10-CM | POA: Diagnosis not present

## 2018-01-11 LAB — CYTOLOGY - PAP
Diagnosis: NEGATIVE
HPV: NOT DETECTED

## 2018-01-15 DIAGNOSIS — E1165 Type 2 diabetes mellitus with hyperglycemia: Secondary | ICD-10-CM | POA: Diagnosis not present

## 2018-01-16 DIAGNOSIS — M21962 Unspecified acquired deformity of left lower leg: Secondary | ICD-10-CM | POA: Diagnosis not present

## 2018-01-16 DIAGNOSIS — M2142 Flat foot [pes planus] (acquired), left foot: Secondary | ICD-10-CM | POA: Diagnosis not present

## 2018-01-16 DIAGNOSIS — M79672 Pain in left foot: Secondary | ICD-10-CM | POA: Diagnosis not present

## 2018-01-16 DIAGNOSIS — M21961 Unspecified acquired deformity of right lower leg: Secondary | ICD-10-CM | POA: Diagnosis not present

## 2018-01-16 DIAGNOSIS — M79671 Pain in right foot: Secondary | ICD-10-CM | POA: Diagnosis not present

## 2018-01-29 ENCOUNTER — Encounter (HOSPITAL_COMMUNITY): Payer: Self-pay | Admitting: Emergency Medicine

## 2018-01-29 ENCOUNTER — Emergency Department (HOSPITAL_COMMUNITY): Payer: BLUE CROSS/BLUE SHIELD

## 2018-01-29 ENCOUNTER — Emergency Department (HOSPITAL_COMMUNITY)
Admission: EM | Admit: 2018-01-29 | Discharge: 2018-01-29 | Disposition: A | Discharge status: Home / Self Care | Payer: BLUE CROSS/BLUE SHIELD | Attending: Emergency Medicine | Admitting: Emergency Medicine

## 2018-01-29 DIAGNOSIS — I10 Essential (primary) hypertension: Secondary | ICD-10-CM | POA: Diagnosis not present

## 2018-01-29 DIAGNOSIS — Z79899 Other long term (current) drug therapy: Secondary | ICD-10-CM | POA: Diagnosis not present

## 2018-01-29 DIAGNOSIS — M79644 Pain in right finger(s): Secondary | ICD-10-CM

## 2018-01-29 DIAGNOSIS — F17210 Nicotine dependence, cigarettes, uncomplicated: Secondary | ICD-10-CM | POA: Insufficient documentation

## 2018-01-29 DIAGNOSIS — E119 Type 2 diabetes mellitus without complications: Secondary | ICD-10-CM | POA: Diagnosis not present

## 2018-01-29 DIAGNOSIS — Z7984 Long term (current) use of oral hypoglycemic drugs: Secondary | ICD-10-CM | POA: Insufficient documentation

## 2018-01-29 NOTE — Discharge Instructions (Addendum)
Follow-up with orthopedics, continue to use Tylenol Motrin for pain, keep the finger immobilized in the thumb spica splint

## 2018-01-29 NOTE — ED Notes (Signed)
Bed: WA05 Expected date:  Expected time:  Means of arrival:  Comments: 
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
## 2018-02-05 DIAGNOSIS — Z8371 Family history of colonic polyps: Secondary | ICD-10-CM | POA: Diagnosis not present

## 2018-02-05 DIAGNOSIS — Z86010 Personal history of colonic polyps: Secondary | ICD-10-CM | POA: Diagnosis not present

## 2018-02-05 DIAGNOSIS — Z1211 Encounter for screening for malignant neoplasm of colon: Secondary | ICD-10-CM | POA: Diagnosis not present

## 2018-02-21 DIAGNOSIS — E785 Hyperlipidemia, unspecified: Secondary | ICD-10-CM | POA: Diagnosis not present

## 2018-02-21 DIAGNOSIS — E1100 Type 2 diabetes mellitus with hyperosmolarity without nonketotic hyperglycemic-hyperosmolar coma (NKHHC): Secondary | ICD-10-CM | POA: Diagnosis not present

## 2018-02-21 DIAGNOSIS — Z6827 Body mass index (BMI) 27.0-27.9, adult: Secondary | ICD-10-CM | POA: Diagnosis not present

## 2018-02-21 DIAGNOSIS — I10 Essential (primary) hypertension: Secondary | ICD-10-CM | POA: Diagnosis not present

## 2018-04-05 DIAGNOSIS — Z1211 Encounter for screening for malignant neoplasm of colon: Secondary | ICD-10-CM | POA: Diagnosis not present

## 2018-04-05 DIAGNOSIS — Z8371 Family history of colonic polyps: Secondary | ICD-10-CM | POA: Diagnosis not present

## 2018-04-05 DIAGNOSIS — D127 Benign neoplasm of rectosigmoid junction: Secondary | ICD-10-CM | POA: Diagnosis not present

## 2018-04-05 DIAGNOSIS — K635 Polyp of colon: Secondary | ICD-10-CM | POA: Diagnosis not present

## 2018-04-24 DIAGNOSIS — E785 Hyperlipidemia, unspecified: Secondary | ICD-10-CM | POA: Diagnosis not present

## 2018-04-24 DIAGNOSIS — E1100 Type 2 diabetes mellitus with hyperosmolarity without nonketotic hyperglycemic-hyperosmolar coma (NKHHC): Secondary | ICD-10-CM | POA: Diagnosis not present

## 2018-04-24 DIAGNOSIS — E7800 Pure hypercholesterolemia, unspecified: Secondary | ICD-10-CM | POA: Diagnosis not present

## 2018-04-24 DIAGNOSIS — I10 Essential (primary) hypertension: Secondary | ICD-10-CM | POA: Diagnosis not present

## 2018-04-24 DIAGNOSIS — Z6825 Body mass index (BMI) 25.0-25.9, adult: Secondary | ICD-10-CM | POA: Diagnosis not present

## 2018-05-13 DIAGNOSIS — H10013 Acute follicular conjunctivitis, bilateral: Secondary | ICD-10-CM | POA: Diagnosis not present

## 2018-05-13 DIAGNOSIS — H40033 Anatomical narrow angle, bilateral: Secondary | ICD-10-CM | POA: Diagnosis not present

## 2018-06-24 DIAGNOSIS — J399 Disease of upper respiratory tract, unspecified: Secondary | ICD-10-CM | POA: Diagnosis not present

## 2018-06-24 DIAGNOSIS — I10 Essential (primary) hypertension: Secondary | ICD-10-CM | POA: Diagnosis not present

## 2018-06-24 DIAGNOSIS — Z6826 Body mass index (BMI) 26.0-26.9, adult: Secondary | ICD-10-CM | POA: Diagnosis not present

## 2018-06-24 DIAGNOSIS — E1100 Type 2 diabetes mellitus with hyperosmolarity without nonketotic hyperglycemic-hyperosmolar coma (NKHHC): Secondary | ICD-10-CM | POA: Diagnosis not present

## 2018-08-27 DIAGNOSIS — F064 Anxiety disorder due to known physiological condition: Secondary | ICD-10-CM | POA: Diagnosis not present

## 2018-08-27 DIAGNOSIS — E1169 Type 2 diabetes mellitus with other specified complication: Secondary | ICD-10-CM | POA: Diagnosis not present

## 2018-08-27 DIAGNOSIS — I10 Essential (primary) hypertension: Secondary | ICD-10-CM | POA: Diagnosis not present

## 2018-11-26 DIAGNOSIS — E1165 Type 2 diabetes mellitus with hyperglycemia: Secondary | ICD-10-CM | POA: Diagnosis not present

## 2018-11-26 DIAGNOSIS — I10 Essential (primary) hypertension: Secondary | ICD-10-CM | POA: Diagnosis not present

## 2018-11-26 DIAGNOSIS — E785 Hyperlipidemia, unspecified: Secondary | ICD-10-CM | POA: Diagnosis not present

## 2018-12-09 DIAGNOSIS — R1033 Periumbilical pain: Secondary | ICD-10-CM | POA: Diagnosis not present

## 2019-01-19 ENCOUNTER — Emergency Department (HOSPITAL_BASED_OUTPATIENT_CLINIC_OR_DEPARTMENT_OTHER)
Admission: EM | Admit: 2019-01-19 | Discharge: 2019-01-19 | Discharge status: Against Medical Advice | Payer: BLUE CROSS/BLUE SHIELD | Attending: Emergency Medicine | Admitting: Emergency Medicine

## 2019-01-19 ENCOUNTER — Other Ambulatory Visit: Payer: Self-pay

## 2019-01-19 ENCOUNTER — Encounter (HOSPITAL_BASED_OUTPATIENT_CLINIC_OR_DEPARTMENT_OTHER): Payer: Self-pay | Admitting: Emergency Medicine

## 2019-01-19 DIAGNOSIS — Z5321 Procedure and treatment not carried out due to patient leaving prior to being seen by health care provider: Secondary | ICD-10-CM | POA: Diagnosis not present

## 2019-01-19 DIAGNOSIS — H5711 Ocular pain, right eye: Secondary | ICD-10-CM | POA: Diagnosis not present

## 2019-01-19 NOTE — ED Triage Notes (Addendum)
R eye pain and swelling x 2 days causing a headache.

## 2019-01-24 DIAGNOSIS — Z1231 Encounter for screening mammogram for malignant neoplasm of breast: Secondary | ICD-10-CM | POA: Diagnosis not present

## 2019-03-31 DIAGNOSIS — I10 Essential (primary) hypertension: Secondary | ICD-10-CM | POA: Diagnosis not present

## 2019-03-31 DIAGNOSIS — L638 Other alopecia areata: Secondary | ICD-10-CM | POA: Diagnosis not present

## 2019-03-31 DIAGNOSIS — E064 Drug-induced thyroiditis: Secondary | ICD-10-CM | POA: Diagnosis not present

## 2019-03-31 DIAGNOSIS — E1169 Type 2 diabetes mellitus with other specified complication: Secondary | ICD-10-CM | POA: Diagnosis not present

## 2019-03-31 DIAGNOSIS — F064 Anxiety disorder due to known physiological condition: Secondary | ICD-10-CM | POA: Diagnosis not present

## 2019-04-11 DIAGNOSIS — H00024 Hordeolum internum left upper eyelid: Secondary | ICD-10-CM | POA: Diagnosis not present

## 2019-04-16 DIAGNOSIS — H00023 Hordeolum internum right eye, unspecified eyelid: Secondary | ICD-10-CM | POA: Diagnosis not present

## 2019-06-10 ENCOUNTER — Ambulatory Visit: Payer: BC Managed Care – PPO | Attending: Internal Medicine

## 2019-06-10 DIAGNOSIS — Z20822 Contact with and (suspected) exposure to covid-19: Secondary | ICD-10-CM

## 2019-06-11 LAB — NOVEL CORONAVIRUS, NAA: SARS-CoV-2, NAA: NOT DETECTED

## 2019-08-08 DIAGNOSIS — I10 Essential (primary) hypertension: Secondary | ICD-10-CM | POA: Diagnosis not present

## 2019-08-08 DIAGNOSIS — J309 Allergic rhinitis, unspecified: Secondary | ICD-10-CM | POA: Diagnosis not present

## 2019-08-08 DIAGNOSIS — E1169 Type 2 diabetes mellitus with other specified complication: Secondary | ICD-10-CM | POA: Diagnosis not present

## 2019-08-18 ENCOUNTER — Other Ambulatory Visit: Payer: Self-pay

## 2019-08-18 ENCOUNTER — Encounter (INDEPENDENT_AMBULATORY_CARE_PROVIDER_SITE_OTHER): Payer: Self-pay | Admitting: Otolaryngology

## 2019-08-18 ENCOUNTER — Ambulatory Visit (INDEPENDENT_AMBULATORY_CARE_PROVIDER_SITE_OTHER): Payer: BC Managed Care – PPO | Admitting: Otolaryngology

## 2019-08-18 VITALS — Temp 97.9°F

## 2019-08-18 DIAGNOSIS — R490 Dysphonia: Secondary | ICD-10-CM | POA: Diagnosis not present

## 2019-08-18 DIAGNOSIS — J383 Other diseases of vocal cords: Secondary | ICD-10-CM | POA: Diagnosis not present

## 2019-08-18 NOTE — Progress Notes (Signed)
HPI: Hannah [name] is a 59 y.o. female who presents is referred by Dr. Criss Rosales for evaluation of hoarseness.  The hoarseness becomes worse when she talks a lot.  She also describes some spasms or pain in her throat at times where she has difficulty talking.  She does not have any trouble swallowing.  She states that if she talks more than 20 minutes she has a tendency to lose her voice and her throat starts hurting. She had previous microlaryngoscopy and biopsy performed in 2016 that demonstrated a small white lesion on the right true vocal cord that on biopsy showed just hyperkeratosis. She smokes half a pack per day. No history of coronary artery disease.Marland Kitchen  Past Medical History:  Diagnosis Date  . Arthritis    feet  . Diabetes mellitus    type 2  . Dysrhythmia   . Family history of adverse reaction to anesthesia    father: post-op nausea and vomiting  . Heart murmur   . Hypertension   . Wears glasses   . Wears partial dentures    partial upper   Past Surgical History:  Procedure Laterality Date  . ABDOMINAL HYSTERECTOMY     in her 52s  . BREAST SURGERY  2 different occasions, over 5 years ago   cyst removal BIL  . CHOLECYSTECTOMY N/A 12/25/2016   Procedure: LYSIS OF ADHESIONS, LAPAROSCOPIC CHOLECYSTECTOMY WITH INTRAOPERATIVE CHOLANGIOGRAM;  Surgeon: Greer Pickerel, MD;  Location: [facility];  Service: General;  Laterality: N/A;  . COLON SURGERY  at least 6 yrs ago per patient   polyps and fissures   . DIAGNOSTIC LAPAROSCOPY  over 20 years ago   exploratory lap  . [place]   left  . INSERTION OF MESH N/A 11/13/2014   Procedure: INSERTION OF MESH;  Surgeon: Ralene Ok, MD;  Location: [facility];  Service: General;  Laterality: N/A;  . MICROLARYNGOSCOPY N/A 05/19/2014   Procedure: MICROLARYNGOSCOPY WITH EXCISION OF VOCAL CORD LESION;  Surgeon: Rozetta Nunnery, MD;  Location: [facility];  Service: ENT;  Laterality: N/A;  . rectal fissure    . UMBILICAL  HERNIA REPAIR N/A 11/13/2014   Procedure: LAPAROSCOPIC UMBILICAL HERNIA REPAIR WITH MESH;  Surgeon: Ralene Ok, MD;  Location: [facility];  Service: General;  Laterality: N/A;   Social History   Socioeconomic History  . Marital status: Single    Spouse name: Not on file  . Number of children: Not on file  . Years of education: Not on file  . Highest education level: Not on file  Occupational History  . Not on file  Tobacco Use  . Smoking status: Current Every Day Smoker    Packs/day: 0.75    Years: 46.00    Pack years: 34.50    Types: Cigarettes    Start date: 16  . Smokeless tobacco: Never Used  Substance and Sexual Activity  . Alcohol use: No  . Drug use: No  . Sexual activity: Yes  Other Topics Concern  . Not on file  Social History Narrative  . Not on file   Social Determinants of Health   Financial Resource Strain:   . Difficulty of Paying Living Expenses:   Food Insecurity:   . Worried About Charity fundraiser in the Last Year:   . Arboriculturist in the Last Year:   Transportation Needs:   . Film/video editor (Medical):   Marland Kitchen Lack of Transportation (Non-Medical):   Physical Activity:   .  Days of Exercise per Week:   . Minutes of Exercise per Session:   Stress:   . Feeling of Stress :   Social Connections:   . Frequency of Communication with Friends and Family:   . Frequency of Social Gatherings with Friends and Family:   . Attends Religious Services:   . Active Member of Clubs or Organizations:   . Attends Archivist Meetings:   Marland Kitchen Marital Status:    No family history on file. Allergies  Allergen Reactions  . Hydrochlorothiazide Other (See Comments)    Reaction:  Muscle spasms  . Latex Hives and Itching   Prior to Admission medications   Medication Sig Start Date End Date Taking? Authorizing Provider  acetaminophen (TYLENOL) 325 MG tablet Take 2 tablets (650 mg total) by mouth every 6 (six) hours as needed. Patient taking  differently: Take 650 mg by mouth every 6 (six) hours as needed for mild pain.  12/26/16  Yes Simaan, Darci Current, PA-C  amLODipine (NORVASC) 5 MG tablet Take 5 mg by mouth daily.   Yes [provider]  BIOTIN PO Take 1 tablet by mouth daily.   Yes [provider]  chlorthalidone (HYGROTON) 25 MG tablet Take 25 mg by mouth daily.    Yes [provider]  lisinopril (PRINIVIL,ZESTRIL) 40 MG tablet Take 40 mg by mouth daily.   Yes [provider]  metFORMIN (GLUCOPHAGE) 500 MG tablet Take 1,000 mg by mouth 2 (two) times daily with a meal.   Yes [provider]  Niacin (VITAMIN B-3 PO) Take 1 tablet by mouth daily.   Yes [provider]  oxyCODONE (OXY IR/ROXICODONE) 5 MG immediate release tablet Take 1-2 tablets (5-10 mg total) by mouth every 4 (four) hours as needed for moderate pain. Patient not taking: Reported on 08/18/2019 12/26/16   Jill Alexanders, PA-C     Positive ROS: Otherwise negative  All other systems have been reviewed and were otherwise negative with the exception of those mentioned in the HPI and as above.  Physical Exam: Constitutional: Alert, well-appearing, no acute distress Ears: External ears without lesions or tenderness. Ear canals are clear bilaterally with intact, clear TMs.  Nasal: External nose without lesions. Septum midline.. Clear nasal passages bilaterally. Oral: Lips and gums without lesions. Tongue and palate mucosa without lesions. Posterior oropharynx clear. Fiberoptic laryngoscopy was performed through the right nostril after anesthetizing the throat with Hurricaine spray.  Of note patient had leukoplakia involving most of the right true vocal cord.  Left true vocal cord was clear.  Both vocal cords had normal mobility. Neck: No palpable adenopathy or masses Respiratory: Breathing comfortably.  Lungs clear to auscultation Cardiac exam: Regular rate and rhythm without murmur Skin: No facial/neck lesions or  rash noted.  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  All  cords seem to have reasonable mobility.    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  Plan: I discussed with Aster today concerning the need for microlaryngoscopy and biopsy.  We will plan on scheduling this in the next few weeks. I also placed her on omeprazole 40 mg daily before dinner for the next month.   Radene Journey, MD   CC:

## 2019-08-26 ENCOUNTER — Ambulatory Visit (INDEPENDENT_AMBULATORY_CARE_PROVIDER_SITE_OTHER): Payer: Self-pay | Admitting: Otolaryngology

## 2019-08-26 DIAGNOSIS — J383 Other diseases of vocal cords: Secondary | ICD-10-CM

## 2019-08-26 NOTE — H&P (View-Only) (Signed)
PREOPERATIVE H&P  Chief Complaint: Hoarseness  HPI: Hannah [name] is a 59 y.o. female who presents for evaluation of hoarseness that she has had for a couple of months now.  This is worse when she talks a lot.  She smokes half a pack a day.  Denies any sore throat.  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  Of note she had previous microlaryngoscopy and biopsy performed 5 years ago that only showed hyperkeratosis.  Past Medical History:  Diagnosis Date  . Arthritis    feet  . Diabetes mellitus    type 2  . Dysrhythmia   . Family history of adverse reaction to anesthesia    father: post-op nausea and vomiting  . Heart murmur   . Hypertension   . Wears glasses   . Wears partial dentures    partial upper   Past Surgical History:  Procedure Laterality Date  . ABDOMINAL HYSTERECTOMY     in her 60s  . BREAST SURGERY  2 different occasions, over 5 years ago   cyst removal BIL  . CHOLECYSTECTOMY N/A 12/25/2016   Procedure: LYSIS OF ADHESIONS, LAPAROSCOPIC CHOLECYSTECTOMY WITH INTRAOPERATIVE CHOLANGIOGRAM;  Surgeon: Greer Pickerel, MD;  Location: [facility];  Service: General;  Laterality: N/A;  . COLON SURGERY  at least 6 yrs ago per patient   polyps and fissures   . DIAGNOSTIC LAPAROSCOPY  over 20 years ago   exploratory lap  . [place]   left  . INSERTION OF MESH N/A 11/13/2014   Procedure: INSERTION OF MESH;  Surgeon: Ralene Ok, MD;  Location: [place];  Service: General;  Laterality: N/A;  . MICROLARYNGOSCOPY N/A 05/19/2014   Procedure: MICROLARYNGOSCOPY WITH EXCISION OF VOCAL CORD LESION;  Surgeon: Rozetta Nunnery, MD;  Location: [place];  Service: ENT;  Laterality: N/A;  . rectal fissure    . UMBILICAL HERNIA REPAIR N/A 11/13/2014   Procedure: LAPAROSCOPIC UMBILICAL HERNIA REPAIR WITH MESH;  Surgeon: Ralene Ok, MD;  Location: [place];  Service: General;  Laterality: N/A;   Social  History   Socioeconomic History  . Marital status: Single    Spouse name: Not on file  . Number of children: Not on file  . Years of education: Not on file  . Highest education level: Not on file  Occupational History  . Not on file  Tobacco Use  . Smoking status: Current Every Day Smoker    Packs/day: 0.75    Years: 46.00    Pack years: 34.50    Types: Cigarettes    Start date: 59  . Smokeless tobacco: Never Used  Substance and Sexual Activity  . Alcohol use: No  . Drug use: No  . Sexual activity: Yes  Other Topics Concern  . Not on file  Social History Narrative  . Not on file   Social Determinants of Health   Financial Resource Strain:   . Difficulty of Paying Living Expenses:   Food Insecurity:   . Worried About Charity fundraiser in the Last Year:   . Arboriculturist in the Last Year:   Transportation Needs:   . Film/video editor (Medical):   Marland Kitchen Lack of Transportation (Non-Medical):   Physical Activity:   . Days of Exercise per Week:   . Minutes of Exercise per Session:   Stress:   . Feeling of Stress :   Social Connections:   . Frequency  of Communication with Friends and Family:   . Frequency of Social Gatherings with Friends and Family:   . Attends Religious Services:   . Active Member of Clubs or Organizations:   . Attends Archivist Meetings:   Marland Kitchen Marital Status:    No family history on file. Allergies  Allergen Reactions  . Hydrochlorothiazide Other (See Comments)    Reaction:  Muscle spasms  . Latex Hives and Itching   Prior to Admission medications   Medication Sig Start Date End Date Taking? Authorizing Provider  acetaminophen (TYLENOL) 325 MG tablet Take 2 tablets (650 mg total) by mouth every 6 (six) hours as needed. Patient taking differently: Take 650 mg by mouth every 6 (six) hours as needed for mild pain.  12/26/16   Jill Alexanders, PA-C  amLODipine (NORVASC) 5 MG tablet Take 5 mg by mouth daily.    [provider]  BIOTIN PO Take 1 tablet by mouth daily.    [provider]  chlorthalidone (HYGROTON) 25 MG tablet Take 25 mg by mouth daily.     [provider]  lisinopril (PRINIVIL,ZESTRIL) 40 MG tablet Take 40 mg by mouth daily.    [provider]  metFORMIN (GLUCOPHAGE) 500 MG tablet Take 1,000 mg by mouth 2 (two) times daily with a meal.    [provider]  Niacin (VITAMIN B-3 PO) Take 1 tablet by mouth daily.    [provider]  oxyCODONE (OXY IR/ROXICODONE) 5 MG immediate release tablet Take 1-2 tablets (5-10 mg total) by mouth every 4 (four) hours as needed for moderate pain. Patient not taking: Reported on 08/18/2019 12/26/16   Jill Alexanders, PA-C     Positive ROS: Otherwise negative  All other systems have been reviewed and were otherwise negative with the exception of those mentioned in the HPI and as above.  Physical Exam: There were no vitals filed for this visit.  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 Neck: No palpable adenopathy or thyroid nodules Ear: Ear canal is clear with normal appearing TMs Cardiovascular: Regular rate and rhythm, no murmur.  Respiratory: Clear to auscultation Neurologic: Alert and oriented x 3   Assessment/Plan: Hoarseness with right true vocal cord leukoplakia  Plan for microlaryngoscopy and biopsy.   Melony Overly, MD 08/26/2019 6:42 PM

## 2019-08-26 NOTE — H&P (Signed)
PREOPERATIVE H&P  Chief Complaint: Hoarseness  HPI: Hannah [name] is a 59 y.o. female who presents for evaluation of hoarseness that she has had for a couple of months now.  This is worse when she talks a lot.  She smokes half a pack a day.  Denies any sore throat.  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  Of note she had previous microlaryngoscopy and biopsy performed 5 years ago that only showed hyperkeratosis.  Past Medical History:  Diagnosis Date  . Arthritis    feet  . Diabetes mellitus    type 2  . Dysrhythmia   . Family history of adverse reaction to anesthesia    father: post-op nausea and vomiting  . Heart murmur   . Hypertension   . Wears glasses   . Wears partial dentures    partial upper   Past Surgical History:  Procedure Laterality Date  . ABDOMINAL HYSTERECTOMY     in her 6s  . BREAST SURGERY  2 different occasions, over 5 years ago   cyst removal BIL  . CHOLECYSTECTOMY N/A 12/25/2016   Procedure: LYSIS OF ADHESIONS, LAPAROSCOPIC CHOLECYSTECTOMY WITH INTRAOPERATIVE CHOLANGIOGRAM;  Surgeon: Greer Pickerel, MD;  Location: [facility];  Service: General;  Laterality: N/A;  . COLON SURGERY  at least 6 yrs ago per patient   polyps and fissures   . DIAGNOSTIC LAPAROSCOPY  over 20 years ago   exploratory lap  . [place]   left  . INSERTION OF MESH N/A 11/13/2014   Procedure: INSERTION OF MESH;  Surgeon: Ralene Ok, MD;  Location: [facility];  Service: General;  Laterality: N/A;  . MICROLARYNGOSCOPY N/A 05/19/2014   Procedure: MICROLARYNGOSCOPY WITH EXCISION OF VOCAL CORD LESION;  Surgeon: Rozetta Nunnery, MD;  Location: [facility];  Service: ENT;  Laterality: N/A;  . rectal fissure    . UMBILICAL HERNIA REPAIR N/A 11/13/2014   Procedure: LAPAROSCOPIC UMBILICAL HERNIA REPAIR WITH MESH;  Surgeon: Ralene Ok, MD;  Location: [place];  Service: General;  Laterality: N/A;   Social  History   Socioeconomic History  . Marital status: Single    Spouse name: Not on file  . Number of children: Not on file  . Years of education: Not on file  . Highest education level: Not on file  Occupational History  . Not on file  Tobacco Use  . Smoking status: Current Every Day Smoker    Packs/day: 0.75    Years: 46.00    Pack years: 34.50    Types: Cigarettes    Start date: 58  . Smokeless tobacco: Never Used  Substance and Sexual Activity  . Alcohol use: No  . Drug use: No  . Sexual activity: Yes  Other Topics Concern  . Not on file  Social History Narrative  . Not on file   Social Determinants of Health   Financial Resource Strain:   . Difficulty of Paying Living Expenses:   Food Insecurity:   . Worried About Charity fundraiser in the Last Year:   . Arboriculturist in the Last Year:   Transportation Needs:   . Film/video editor (Medical):   Marland Kitchen Lack of Transportation (Non-Medical):   Physical Activity:   . Days of Exercise per Week:   . Minutes of Exercise per Session:   Stress:   . Feeling of Stress :   Social Connections:   . Frequency  of Communication with Friends and Family:   . Frequency of Social Gatherings with Friends and Family:   . Attends Religious Services:   . Active Member of Clubs or Organizations:   . Attends Archivist Meetings:   Marland Kitchen Marital Status:    No family history on file. Allergies  Allergen Reactions  . Hydrochlorothiazide Other (See Comments)    Reaction:  Muscle spasms  . Latex Hives and Itching   Prior to Admission medications   Medication Sig Start Date End Date Taking? Authorizing Provider  acetaminophen (TYLENOL) 325 MG tablet Take 2 tablets (650 mg total) by mouth every 6 (six) hours as needed. Patient taking differently: Take 650 mg by mouth every 6 (six) hours as needed for mild pain.  12/26/16   Jill Alexanders, PA-C  amLODipine (NORVASC) 5 MG tablet Take 5 mg by mouth daily.    [provider]  BIOTIN PO Take 1 tablet by mouth daily.    [provider]  chlorthalidone (HYGROTON) 25 MG tablet Take 25 mg by mouth daily.     [provider]  lisinopril (PRINIVIL,ZESTRIL) 40 MG tablet Take 40 mg by mouth daily.    [provider]  metFORMIN (GLUCOPHAGE) 500 MG tablet Take 1,000 mg by mouth 2 (two) times daily with a meal.    [provider]  Niacin (VITAMIN B-3 PO) Take 1 tablet by mouth daily.    [provider]  oxyCODONE (OXY IR/ROXICODONE) 5 MG immediate release tablet Take 1-2 tablets (5-10 mg total) by mouth every 4 (four) hours as needed for moderate pain. Patient not taking: Reported on 08/18/2019 12/26/16   Jill Alexanders, PA-C     Positive ROS: Otherwise negative  All other systems have been reviewed and were otherwise negative with the exception of those mentioned in the HPI and as above.  Physical Exam: There were no vitals filed for this visit.  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 Neck: No palpable adenopathy or thyroid nodules Ear: Ear canal is clear with normal appearing TMs Cardiovascular: Regular rate and rhythm, no murmur.  Respiratory: Clear to auscultation Neurologic: Alert and oriented x 3   Assessment/Plan: Hoarseness with right true vocal cord leukoplakia  Plan for microlaryngoscopy and biopsy.   Melony Overly, MD 08/26/2019 6:42 PM

## 2019-08-28 ENCOUNTER — Encounter (HOSPITAL_BASED_OUTPATIENT_CLINIC_OR_DEPARTMENT_OTHER): Payer: Self-pay | Admitting: Otolaryngology

## 2019-08-28 ENCOUNTER — Other Ambulatory Visit: Payer: Self-pay

## 2019-09-02 ENCOUNTER — Encounter (HOSPITAL_BASED_OUTPATIENT_CLINIC_OR_DEPARTMENT_OTHER)
Admission: RE | Admit: 2019-09-02 | Discharge: 2019-09-02 | Disposition: A | Discharge status: Home / Self Care | Payer: BC Managed Care – PPO | Source: Ambulatory Visit | Attending: Otolaryngology | Admitting: Otolaryngology

## 2019-09-02 ENCOUNTER — Other Ambulatory Visit (HOSPITAL_COMMUNITY)
Admission: RE | Admit: 2019-09-02 | Discharge: 2019-09-02 | Disposition: A | Discharge status: Home / Self Care | Payer: BC Managed Care – PPO | Source: Ambulatory Visit | Attending: Otolaryngology | Admitting: Otolaryngology

## 2019-09-02 DIAGNOSIS — Z20822 Contact with and (suspected) exposure to covid-19: Secondary | ICD-10-CM | POA: Insufficient documentation

## 2019-09-02 DIAGNOSIS — Z01812 Encounter for preprocedural laboratory examination: Secondary | ICD-10-CM | POA: Diagnosis not present

## 2019-09-02 LAB — BASIC METABOLIC PANEL
Anion gap: 11 (ref 5–15)
BUN: 21 mg/dL — ABNORMAL HIGH (ref 6–20)
CO2: 29 mmol/L (ref 22–32)
Calcium: 9.5 mg/dL (ref 8.9–10.3)
Chloride: 97 mmol/L — ABNORMAL LOW (ref 98–111)
Creatinine, Ser: 0.96 mg/dL (ref 0.44–1.00)
GFR calc Af Amer: >60 mL/min (ref >60)
GFR calc non Af Amer: >60 mL/min (ref >60)
Glucose, Bld: 175 mg/dL — ABNORMAL HIGH (ref 70–99)
Potassium: 4 mmol/L (ref 3.5–5.1)
Sodium: 137 mmol/L (ref 135–145)

## 2019-09-02 LAB — SARS CORONAVIRUS 2 (TAT 6-24 HRS): SARS Coronavirus 2: NEGATIVE

## 2019-09-02 NOTE — Progress Notes (Signed)
Surgical soap given to patient with instructions for use.  Pt verbalized understanding of instructions.

## 2019-09-05 ENCOUNTER — Other Ambulatory Visit: Payer: Self-pay

## 2019-09-05 ENCOUNTER — Ambulatory Visit (HOSPITAL_BASED_OUTPATIENT_CLINIC_OR_DEPARTMENT_OTHER)
Admission: RE | Admit: 2019-09-05 | Discharge: 2019-09-05 | Disposition: A | Discharge status: Home / Self Care | Payer: BC Managed Care – PPO | Attending: Otolaryngology | Admitting: Otolaryngology

## 2019-09-05 ENCOUNTER — Encounter (HOSPITAL_BASED_OUTPATIENT_CLINIC_OR_DEPARTMENT_OTHER): Payer: Self-pay | Admitting: Otolaryngology

## 2019-09-05 ENCOUNTER — Encounter (HOSPITAL_BASED_OUTPATIENT_CLINIC_OR_DEPARTMENT_OTHER)
Admission: RE | Disposition: A | Discharge status: Home / Self Care | Payer: Self-pay | Source: Home / Self Care | Attending: Otolaryngology

## 2019-09-05 ENCOUNTER — Ambulatory Visit (HOSPITAL_BASED_OUTPATIENT_CLINIC_OR_DEPARTMENT_OTHER): Payer: BC Managed Care – PPO | Admitting: Certified Registered Nurse Anesthetist

## 2019-09-05 DIAGNOSIS — M1990 Unspecified osteoarthritis, unspecified site: Secondary | ICD-10-CM | POA: Diagnosis not present

## 2019-09-05 DIAGNOSIS — J383 Other diseases of vocal cords: Secondary | ICD-10-CM | POA: Insufficient documentation

## 2019-09-05 DIAGNOSIS — F17210 Nicotine dependence, cigarettes, uncomplicated: Secondary | ICD-10-CM | POA: Insufficient documentation

## 2019-09-05 DIAGNOSIS — I10 Essential (primary) hypertension: Secondary | ICD-10-CM | POA: Diagnosis not present

## 2019-09-05 DIAGNOSIS — E119 Type 2 diabetes mellitus without complications: Secondary | ICD-10-CM | POA: Insufficient documentation

## 2019-09-05 DIAGNOSIS — R490 Dysphonia: Secondary | ICD-10-CM | POA: Insufficient documentation

## 2019-09-05 DIAGNOSIS — Z79899 Other long term (current) drug therapy: Secondary | ICD-10-CM | POA: Diagnosis not present

## 2019-09-05 DIAGNOSIS — Z7984 Long term (current) use of oral hypoglycemic drugs: Secondary | ICD-10-CM | POA: Diagnosis not present

## 2019-09-05 DIAGNOSIS — D380 Neoplasm of uncertain behavior of larynx: Secondary | ICD-10-CM | POA: Diagnosis not present

## 2019-09-05 HISTORY — PX: MICROLARYNGOSCOPY WITH CO2 LASER AND EXCISION OF VOCAL CORD LESION: SHX5970

## 2019-09-05 LAB — GLUCOSE, CAPILLARY
Glucose-Capillary: 132 mg/dL — ABNORMAL HIGH (ref 70–99)
Glucose-Capillary: 185 mg/dL — ABNORMAL HIGH (ref 70–99)

## 2019-09-05 SURGERY — MICROLARYNGOSCOPY WITH CO2 LASER AND EXCISION OF VOCAL CORD LESION
Anesthesia: General | Site: Throat

## 2019-09-05 MED ORDER — FENTANYL CITRATE (PF) 100 MCG/2ML IJ SOLN: 50.0000 ug | INTRAMUSCULAR | Status: DC | PRN

## 2019-09-05 MED ORDER — SODIUM CHLORIDE (PF) 0.9 % IJ SOLN
INTRAMUSCULAR | Status: AC
  Filled 2019-09-05: qty 10

## 2019-09-05 MED ORDER — LIDOCAINE HCL (CARDIAC) PF 100 MG/5ML IV SOSY
PREFILLED_SYRINGE | INTRAVENOUS | Status: DC | PRN
  Administered 2019-09-05: 60 mg via INTRAVENOUS

## 2019-09-05 MED ORDER — CHLORHEXIDINE GLUCONATE CLOTH 2 % EX PADS: 6.0000 | MEDICATED_PAD | Freq: Once | CUTANEOUS | Status: DC

## 2019-09-05 MED ORDER — MIDAZOLAM HCL 2 MG/2ML IJ SOLN
INTRAMUSCULAR | Status: DC | PRN
  Administered 2019-09-05: 2 mg via INTRAVENOUS

## 2019-09-05 MED ORDER — EPINEPHRINE PF 1 MG/ML IJ SOLN
INTRAMUSCULAR | Status: AC
  Filled 2019-09-05: qty 2

## 2019-09-05 MED ORDER — OXYCODONE HCL 5 MG/5ML PO SOLN: 5.0000 mg | Freq: Once | ORAL | Status: DC | PRN

## 2019-09-05 MED ORDER — SUCCINYLCHOLINE CHLORIDE 200 MG/10ML IV SOSY
PREFILLED_SYRINGE | INTRAVENOUS | Status: AC
  Filled 2019-09-05: qty 10

## 2019-09-05 MED ORDER — ACETAMINOPHEN 10 MG/ML IV SOLN
INTRAVENOUS | Status: AC
  Filled 2019-09-05: qty 100

## 2019-09-05 MED ORDER — LACTATED RINGERS IV SOLN: INTRAVENOUS | Status: DC

## 2019-09-05 MED ORDER — ACETAMINOPHEN 10 MG/ML IV SOLN
1000.0000 mg | Freq: Once | INTRAVENOUS | Status: DC | PRN
  Administered 2019-09-05: 09:00:00 1000 mg via INTRAVENOUS

## 2019-09-05 MED ORDER — SUCCINYLCHOLINE CHLORIDE 200 MG/10ML IV SOSY
PREFILLED_SYRINGE | INTRAVENOUS | Status: DC | PRN
  Administered 2019-09-05: 120 mg via INTRAVENOUS

## 2019-09-05 MED ORDER — OXYCODONE HCL 5 MG PO TABS: 5.0000 mg | ORAL_TABLET | Freq: Once | ORAL | Status: DC | PRN

## 2019-09-05 MED ORDER — CEFAZOLIN SODIUM-DEXTROSE 2-4 GM/100ML-% IV SOLN
2.0000 g | INTRAVENOUS | Status: AC
  Administered 2019-09-05: 2 g via INTRAVENOUS

## 2019-09-05 MED ORDER — DEXAMETHASONE SODIUM PHOSPHATE 10 MG/ML IJ SOLN
INTRAMUSCULAR | Status: DC | PRN
  Administered 2019-09-05 (×2): 5 mg via INTRAVENOUS

## 2019-09-05 MED ORDER — ROCURONIUM BROMIDE 10 MG/ML (PF) SYRINGE
PREFILLED_SYRINGE | INTRAVENOUS | Status: DC | PRN
  Administered 2019-09-05: 30 mg via INTRAVENOUS

## 2019-09-05 MED ORDER — ONDANSETRON HCL 4 MG/2ML IJ SOLN
INTRAMUSCULAR | Status: AC
  Filled 2019-09-05: qty 2

## 2019-09-05 MED ORDER — SUGAMMADEX SODIUM 200 MG/2ML IV SOLN
INTRAVENOUS | Status: DC | PRN
  Administered 2019-09-05: 150 mg via INTRAVENOUS

## 2019-09-05 MED ORDER — EPINEPHRINE PF 1 MG/ML IJ SOLN
INTRAMUSCULAR | Status: DC | PRN
  Administered 2019-09-05: 1 mg

## 2019-09-05 MED ORDER — FENTANYL CITRATE (PF) 100 MCG/2ML IJ SOLN
INTRAMUSCULAR | Status: AC
  Filled 2019-09-05: qty 2

## 2019-09-05 MED ORDER — METHYLENE BLUE 0.5 % INJ SOLN
INTRAVENOUS | Status: AC
  Filled 2019-09-05: qty 10

## 2019-09-05 MED ORDER — ONDANSETRON HCL 4 MG/2ML IJ SOLN
4.0000 mg | Freq: Once | INTRAMUSCULAR | Status: AC | PRN
  Administered 2019-09-05: 4 mg via INTRAVENOUS

## 2019-09-05 MED ORDER — LIDOCAINE 2% (20 MG/ML) 5 ML SYRINGE
INTRAMUSCULAR | Status: AC
  Filled 2019-09-05: qty 5

## 2019-09-05 MED ORDER — MIDAZOLAM HCL 2 MG/2ML IJ SOLN
INTRAMUSCULAR | Status: AC
  Filled 2019-09-05: qty 2

## 2019-09-05 MED ORDER — PROPOFOL 10 MG/ML IV BOLUS
INTRAVENOUS | Status: DC | PRN
  Administered 2019-09-05 (×2): 50 mg via INTRAVENOUS
  Administered 2019-09-05: 100 mg via INTRAVENOUS

## 2019-09-05 MED ORDER — EPINEPHRINE PF 1 MG/ML IJ SOLN
INTRAMUSCULAR | Status: AC
  Filled 2019-09-05: qty 1

## 2019-09-05 MED ORDER — CEFAZOLIN SODIUM-DEXTROSE 2-4 GM/100ML-% IV SOLN
INTRAVENOUS | Status: AC
  Filled 2019-09-05: qty 100

## 2019-09-05 MED ORDER — FENTANYL CITRATE (PF) 100 MCG/2ML IJ SOLN
25.0000 ug | INTRAMUSCULAR | Status: DC | PRN
  Administered 2019-09-05: 25 ug via INTRAVENOUS

## 2019-09-05 MED ORDER — ONDANSETRON HCL 4 MG/2ML IJ SOLN
INTRAMUSCULAR | Status: DC | PRN
  Administered 2019-09-05: 4 mg via INTRAVENOUS

## 2019-09-05 MED ORDER — DEXAMETHASONE SODIUM PHOSPHATE 10 MG/ML IJ SOLN
INTRAMUSCULAR | Status: AC
  Filled 2019-09-05: qty 1

## 2019-09-05 MED ORDER — MIDAZOLAM HCL 2 MG/2ML IJ SOLN: 1.0000 mg | INTRAMUSCULAR | Status: DC | PRN

## 2019-09-05 MED ORDER — PROPOFOL 10 MG/ML IV BOLUS
INTRAVENOUS | Status: AC
  Filled 2019-09-05: qty 40

## 2019-09-05 MED ORDER — FENTANYL CITRATE (PF) 100 MCG/2ML IJ SOLN
INTRAMUSCULAR | Status: DC | PRN
  Administered 2019-09-05 (×2): 50 ug via INTRAVENOUS
  Administered 2019-09-05: 100 ug via INTRAVENOUS

## 2019-09-05 SURGICAL SUPPLY — 34 items
BNDG EYE OVAL (GAUZE/BANDAGES/DRESSINGS) ×4 IMPLANT
CANISTER SUCT 1200ML W/VALVE (MISCELLANEOUS) ×2 IMPLANT
COVER WAND RF STERILE (DRAPES) IMPLANT
DEPRESSOR TONGUE BLADE STERILE (MISCELLANEOUS) ×1 IMPLANT
FILTER 7/8 IN (FILTER) ×1 IMPLANT
GAUZE SPONGE 4X4 12PLY STRL LF (GAUZE/BANDAGES/DRESSINGS) ×3 IMPLANT
GLOVE BIOGEL PI IND STRL 7.0 (GLOVE) IMPLANT
GLOVE BIOGEL PI INDICATOR 7.0 (GLOVE) ×1
GLOVE SS BIOGEL STRL SZ 7.5 (GLOVE) ×1 IMPLANT
GLOVE SUPERSENSE BIOGEL SZ 7.5 (GLOVE)
GLOVE SURG SS PI 7.0 STRL IVOR (GLOVE) ×1 IMPLANT
GLOVE SURG SYN 7.5  E (GLOVE) ×2
GLOVE SURG SYN 7.5 E (GLOVE) ×1 IMPLANT
GLOVE SURG SYN 7.5 PF PI (GLOVE) IMPLANT
GOWN STRL REUS W/ TWL LRG LVL3 (GOWN DISPOSABLE) IMPLANT
GOWN STRL REUS W/TWL LRG LVL3 (GOWN DISPOSABLE) ×2
GOWN STRL REUS W/TWL XL LVL3 (GOWN DISPOSABLE) ×1 IMPLANT
GUARD TEETH (MISCELLANEOUS) IMPLANT
MARKER SKIN DUAL TIP RULER LAB (MISCELLANEOUS) IMPLANT
NDL SAFETY ECLIPSE 18X1.5 (NEEDLE) ×1 IMPLANT
NDL SPNL 22GX7 QUINCKE BK (NEEDLE) IMPLANT
NEEDLE HYPO 18GX1.5 SHARP (NEEDLE) ×2
NEEDLE SPNL 22GX7 QUINCKE BK (NEEDLE) IMPLANT
NS IRRIG 1000ML POUR BTL (IV SOLUTION) ×2 IMPLANT
PACK BASIN DAY SURGERY FS (CUSTOM PROCEDURE TRAY) ×2 IMPLANT
PATTIES SURGICAL .5 X3 (DISPOSABLE) ×2 IMPLANT
REDUCTION FITTING 1/4 IN (FILTER) ×1 IMPLANT
SHEET MEDIUM DRAPE 40X70 STRL (DRAPES) ×2 IMPLANT
SLEEVE SCD COMPRESS KNEE MED (MISCELLANEOUS) ×2 IMPLANT
SOLUTION BUTLER CLEAR DIP (MISCELLANEOUS) ×2 IMPLANT
SYR 5ML LL (SYRINGE) ×2 IMPLANT
SYR CONTROL 10ML LL (SYRINGE) IMPLANT
TOWEL GREEN STERILE FF (TOWEL DISPOSABLE) ×2 IMPLANT
TUBE CONNECTING 20X1/4 (TUBING) ×3 IMPLANT

## 2019-09-05 NOTE — Brief Op Note (Signed)
09/05/2019  8:36 AM  PATIENT:  Hannah [name]  59 y.o. female  PRE-OPERATIVE DIAGNOSIS:  HOARSENESS with RIGHT VOCAL CORD LESION  POST-OPERATIVE DIAGNOSIS:  HOARSENESS with RIGHT VOCAL CORD LESION  PROCEDURE:  Procedure(s): MICROLARYNGOSCOPY WITH EXCISION OF VOCAL CORD LESION (N/A) Right  SURGEON:  Surgeon(s) and Role:    Rozetta Nunnery, MD - Primary  PHYSICIAN ASSISTANT:   ASSISTANTS: none   ANESTHESIA:   general  EBL:  Minimal  BLOOD ADMINISTERED:none  DRAINS: none   LOCAL MEDICATIONS USED:  NONE  SPECIMEN:  Source of Specimen:  right vocal cord lesion  DISPOSITION OF SPECIMEN:  PATHOLOGY  COUNTS:  YES  TOURNIQUET:  * No tourniquets in log *  DICTATION: .Other Dictation: Dictation Number [id number]  PLAN OF CARE: Discharge to home after PACU  PATIENT DISPOSITION:  PACU - hemodynamically stable.   Delay start of Pharmacological VTE agent (>24hrs) due to surgical blood loss or risk of bleeding: yes

## 2019-09-05 NOTE — Anesthesia Postprocedure Evaluation (Signed)
Anesthesia Post Note  Patient: Hannah [name]  Procedure(s) Performed: MICROLARYNGOSCOPY WITH EXCISION OF VOCAL CORD LESION (N/A Throat)     Patient location during evaluation: PACU Anesthesia Type: General Level of consciousness: awake and alert Pain management: pain level controlled Vital Signs Assessment: post-procedure vital signs reviewed and stable Respiratory status: spontaneous breathing, nonlabored ventilation, respiratory function stable and patient connected to nasal cannula oxygen Cardiovascular status: blood pressure returned to baseline and stable Postop Assessment: no apparent nausea or vomiting Anesthetic complications: no    Last Vitals:  Vitals:   09/05/19 0945 09/05/19 0949  BP: (!) 111/58 115/61  Pulse: 79 86  Resp: 16 18  Temp:    SpO2: 100% 100%    Last Pain:  Vitals:   09/05/19 0931  TempSrc:   PainSc: Asleep                 Babe Clenney S

## 2019-09-05 NOTE — Interval H&P Note (Signed)
History and Physical Interval Note:  09/05/2019 7:32 AM  Hannah [name]  has presented today for surgery, with the diagnosis of HORSENESS.  The various methods of treatment have been discussed with the patient and family. After consideration of risks, benefits and other options for treatment, the patient has consented to  Procedure(s): MICROLARYNGOSCOPY WITH EXCISION OF VOCAL CORD LESION (N/A) as a surgical intervention.  The patient's history has been reviewed, patient examined, no change in status, stable for surgery.  I have reviewed the patient's chart and labs.  Questions were answered to the patient's satisfaction.     Melony Overly

## 2019-09-05 NOTE — Anesthesia Preprocedure Evaluation (Signed)
Anesthesia Evaluation  Patient identified by MRN, date of birth, ID band Patient awake    Reviewed: Allergy & Precautions, NPO status , Patient's Chart, lab work & pertinent test results  Airway Mallampati: II  TM Distance: >3 FB Neck ROM: Full    Dental no notable dental hx.    Pulmonary Current Smoker and Patient abstained from smoking.,    Pulmonary exam normal breath sounds clear to auscultation       Cardiovascular hypertension, Pt. on medications Normal cardiovascular exam Rhythm:Regular Rate:Normal     Neuro/Psych negative neurological ROS  negative psych ROS   GI/Hepatic negative GI ROS, Neg liver ROS,   Endo/Other  diabetes  Renal/GU negative Renal ROS  negative genitourinary   Musculoskeletal negative musculoskeletal ROS (+)   Abdominal   Peds negative pediatric ROS (+)  Hematology negative hematology ROS (+)   Anesthesia Other Findings   Reproductive/Obstetrics negative OB ROS                             Anesthesia Physical Anesthesia Plan  ASA: II  Anesthesia Plan: General   Post-op Pain Management:    Induction: Intravenous  PONV Risk Score and Plan: 2 and Ondansetron, Dexamethasone and Treatment may vary due to age or medical condition  Airway Management Planned: Oral ETT  Additional Equipment:   Intra-op Plan:   Post-operative Plan: Extubation in OR  Informed Consent: I have reviewed the patients History and Physical, chart, labs and discussed the procedure including the risks, benefits and alternatives for the proposed anesthesia with the patient or authorized representative who has indicated his/her understanding and acceptance.     Dental advisory given  Plan Discussed with: CRNA and Surgeon  Anesthesia Plan Comments:         Anesthesia Quick Evaluation

## 2019-09-05 NOTE — Transfer of Care (Signed)
Immediate Anesthesia Transfer of Care Note  Patient: Hannah [name]  Procedure(s) Performed: MICROLARYNGOSCOPY WITH EXCISION OF VOCAL CORD LESION (N/A Throat)  Patient Location: PACU  Anesthesia Type:General  Level of Consciousness: awake, alert , oriented and patient cooperative  Airway & Oxygen Therapy: Patient Spontanous Breathing and Patient connected to face mask oxygen  Post-op Assessment: Report given to RN and Post -op Vital signs reviewed and stable  Post vital signs: Reviewed and stable  Last Vitals:  Vitals Value Taken Time  BP 184/80 09/05/19 0848  Temp    Pulse 79 09/05/19 0852  Resp 15 09/05/19 0852  SpO2 100 % 09/05/19 0852  Vitals shown include unvalidated device data.  Last Pain:  Vitals:   09/05/19 0640  TempSrc: Oral  PainSc: 0-No pain         Complications: No apparent anesthesia complications

## 2019-09-05 NOTE — Anesthesia Procedure Notes (Signed)
Procedure Name: Intubation Date/Time: 09/05/2019 7:47 AM Performed by: Raenette Rover, CRNA Pre-anesthesia Checklist: Patient identified, Emergency Drugs available, Suction available and Patient being monitored Patient Re-evaluated:Patient Re-evaluated prior to induction Oxygen Delivery Method: Circle system utilized Preoxygenation: Pre-oxygenation with 100% oxygen Induction Type: IV induction Ventilation: Mask ventilation without difficulty Laryngoscope Size: Mac and 3 Grade View: Grade II Laser Tube: Laser Tube and Cuffed inflated with minimal occlusive pressure - saline Tube size: 6.0 mm Number of attempts: 2 Airway Equipment and Method: Stylet Placement Confirmation: positive ETCO2 and breath sounds checked- equal and bilateral Tube secured with: Tape Dental Injury: Teeth and Oropharynx as per pre-operative assessment  Difficulty Due To: Difficult Airway- due to anterior larynx

## 2019-09-05 NOTE — Discharge Instructions (Signed)
Tylenol or ibuprofen prn pain. No Tylenol until 3:15pm if needed. Throat lozenges prn sore throat Diet as tolerated  Rest voice for the next week Take antacid med omeprazole as previously prescribed for the next 2 weeks Return to see Dr Lucia Gaskins at his office next Friday at 4:30 Call office if any problems or questions    [phone]    Post Anesthesia Home Care Instructions  Activity: Get plenty of rest for the remainder of the day. A responsible individual must stay with you for 24 hours following the procedure.  For the next 24 hours, DO NOT: -Drive a car -Paediatric nurse -Drink alcoholic beverages -Take any medication unless instructed by your physician -Make any legal decisions or sign important papers.  Meals: Start with liquid foods such as gelatin or soup. Progress to regular foods as tolerated. Avoid greasy, spicy, heavy foods. If nausea and/or vomiting occur, drink only clear liquids until the nausea and/or vomiting subsides. Call your physician if vomiting continues.  Special Instructions/Symptoms: Your throat may feel dry or sore from the anesthesia or the breathing tube placed in your throat during surgery. If this causes discomfort, gargle with warm salt water. The discomfort should disappear within 24 hours.  If you had a scopolamine patch placed behind your ear for the management of post- operative nausea and/or vomiting:  1. The medication in the patch is effective for 72 hours, after which it should be removed.  Wrap patch in a tissue and discard in the trash. Wash hands thoroughly with soap and water. 2. You may remove the patch earlier than 72 hours if you experience unpleasant side effects which may include dry mouth, dizziness or visual disturbances. 3. Avoid touching the patch. Wash your hands with soap and water after contact with the patch.

## 2019-09-05 NOTE — Op Note (Signed)
NAME: Hannah [name], DUKART MEDICAL RECORD [id number] ACCOUNT [id number] DATE OF BIRTH:[birth year] FACILITY: [facility] LOCATION: [facility] PHYSICIAN:Anastazja Isaac Lincoln Maxin, MD  OPERATIVE REPORT  DATE OF PROCEDURE:  09/05/2019  PREOPERATIVE DIAGNOSIS:  Hoarseness with right true vocal cord leukoplakia.  POSTOPERATIVE DIAGNOSIS:  Hoarseness with right true vocal cord leukoplakia.  OPERATION PERFORMED:  Microlaryngoscopy with excisional biopsy of right true vocal cord leukoplakia.  SURGEON:  Melony Overly, MD  ANESTHESIA:  General endotracheal.  COMPLICATIONS:  None.  BRIEF CLINICAL NOTE:  The patient is a 59 year old female who has had previous direct laryngoscopy and biopsy performed 5 years ago that showed a benign lesion on the right true vocal cord.  She has had some recurrent hoarseness and on exam in the office  with fiberoptic laryngoscopy, she has a right vocal cord leukoplakia.  She is taken to the operating room at this time for microlaryngoscopy and biopsy of right true vocal cord leukoplakia.  DESCRIPTION OF PROCEDURE:  After adequate endotracheal anesthesia, direct laryngoscopy was performed.  Of note, she had a small mouth with a very anterior vocal cord making intubation a little bit difficult.  She was initially intubated with a laser  tube.  Because of limited visualization, the tube was changed to a regular #6 endotracheal tube.  A direct laryngoscopy was performed with the anterior laryngoscope because of the limited visualization.  This was suspended.  The patient had a lesion  involving the anterior right true vocal cord extended down to the midportion of the cord.  This was soft and superficial.  After suspending the laryngoscope, biopsy was obtained with cup forceps.  Pictures were obtained.  Following biopsy, a cotton  pledget soaked in adrenaline was used for hemostasis.  This completed the procedure.  The patient was subsequently awoken from anesthesia and  transferred to recovery room  postoperatively doing well.  DISPOSITION:  The patient is to be discharged home later this morning.  She was instructed to continue with her antacid that she took preop for another 2 weeks.  Recommended voice rest and use of throat lozenges for sore throat p.r.n. any pain or  discomfort.  She will follow up in my office in 1 week for a recheck and review of pathology.  VN/NUANCE  D:09/05/2019 T:09/05/2019 JOB:[id number]

## 2019-09-09 ENCOUNTER — Encounter: Payer: Self-pay | Admitting: *Deleted

## 2019-09-09 LAB — SURGICAL PATHOLOGY

## 2019-09-12 ENCOUNTER — Other Ambulatory Visit: Payer: Self-pay

## 2019-09-12 ENCOUNTER — Ambulatory Visit (INDEPENDENT_AMBULATORY_CARE_PROVIDER_SITE_OTHER): Payer: BC Managed Care – PPO | Admitting: Otolaryngology

## 2019-09-12 VITALS — Temp 97.5°F

## 2019-09-12 DIAGNOSIS — Z4889 Encounter for other specified surgical aftercare: Secondary | ICD-10-CM

## 2019-09-12 NOTE — Progress Notes (Signed)
HPI: Hannah [name] is a 59 y.o. 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 Final pathology revealed no evidence of malignancy but moderate dysplasia. Her voice is doing well..   Past Medical History:  Diagnosis Date  . Arthritis    feet  . Diabetes mellitus    type 2  . Dysrhythmia   . Family history of adverse reaction to anesthesia    father: post-op nausea and vomiting  . Heart murmur   . Hypertension   . Wears glasses   . Wears partial dentures    partial upper   Past Surgical History:  Procedure Laterality Date  . ABDOMINAL HYSTERECTOMY     in her 1s  . BREAST SURGERY  2 different occasions, over 5 years ago   cyst removal BIL  . CHOLECYSTECTOMY N/A 12/25/2016   Procedure: LYSIS OF ADHESIONS, LAPAROSCOPIC CHOLECYSTECTOMY WITH INTRAOPERATIVE CHOLANGIOGRAM;  Surgeon: Greer Pickerel, MD;  Location: [facility];  Service: General;  Laterality: N/A;  . COLON SURGERY  at least 6 yrs ago per patient   polyps and fissures   . DIAGNOSTIC LAPAROSCOPY  over 20 years ago   exploratory lap  . [place]   left  . INSERTION OF MESH N/A 11/13/2014   Procedure: INSERTION OF MESH;  Surgeon: Ralene Ok, MD;  Location: [place];  Service: General;  Laterality: N/A;  . MICROLARYNGOSCOPY N/A 05/19/2014   Procedure: MICROLARYNGOSCOPY WITH EXCISION OF VOCAL CORD LESION;  Surgeon: Rozetta Nunnery, MD;  Location: [place];  Service: ENT;  Laterality: N/A;  . MICROLARYNGOSCOPY WITH CO2 LASER AND EXCISION OF VOCAL CORD LESION N/A 09/05/2019   Procedure: MICROLARYNGOSCOPY WITH EXCISION OF VOCAL CORD LESION;  Surgeon: Rozetta Nunnery, MD;  Location: [place];  Service: ENT;  Laterality: N/A;  . rectal fissure    . UMBILICAL HERNIA REPAIR N/A 11/13/2014   Procedure: LAPAROSCOPIC UMBILICAL HERNIA REPAIR WITH MESH;  Surgeon: Ralene Ok, MD;  Location: [place];  Service: General;  Laterality:  N/A;   Social History   Socioeconomic History  . Marital status: Married    Spouse name: Not on file  . Number of children: Not on file  . Years of education: Not on file  . Highest education level: Not on file  Occupational History  . Not on file  Tobacco Use  . Smoking status: Current Every Day Smoker    Packs/day: 0.75    Years: 46.00    Pack years: 34.50    Types: Cigarettes    Start date: 51  . Smokeless tobacco: Never Used  Substance and Sexual Activity  . Alcohol use: No  . Drug use: No  . Sexual activity: Yes  Other Topics Concern  . Not on file  Social History Narrative  . Not on file   Social Determinants of Health   Financial Resource Strain:   . Difficulty of Paying Living Expenses:   Food Insecurity:   . Worried About Charity fundraiser in the Last Year:   . Arboriculturist in the Last Year:   Transportation Needs:   . Film/video editor (Medical):   Marland Kitchen Lack of Transportation (Non-Medical):   Physical Activity:   . Days of Exercise per Week:   . Minutes of Exercise per Session:   Stress:   . Feeling of Stress :   Social Connections:   . Frequency of Communication with Friends and Family:   .  Frequency of Social Gatherings with Friends and Family:   . Attends Religious Services:   . Active Member of Clubs or Organizations:   . Attends Archivist Meetings:   Marland Kitchen Marital Status:    No family history on file. Allergies  Allergen Reactions  . Hydrochlorothiazide Other (See Comments)    Reaction:  Muscle spasms  . Latex Hives and Itching   Prior to Admission medications   Medication Sig Start Date End Date Taking? Authorizing Provider  acetaminophen (TYLENOL) 325 MG tablet Take 2 tablets (650 mg total) by mouth every 6 (six) hours as needed. Patient taking differently: Take 650 mg by mouth every 6 (six) hours as needed for mild pain.  12/26/16  Yes Simaan, Darci Current, PA-C  amLODipine (NORVASC) 5 MG tablet Take 5 mg by mouth daily.    Yes [provider]  chlorthalidone (HYGROTON) 25 MG tablet Take 25 mg by mouth daily.    Yes [provider]  empagliflozin (JARDIANCE) 10 MG TABS tablet Take 10 mg by mouth daily.   Yes [provider]  lisinopril (PRINIVIL,ZESTRIL) 40 MG tablet Take 40 mg by mouth daily.   Yes [provider]  metFORMIN (GLUCOPHAGE) 500 MG tablet Take 1,000 mg by mouth 2 (two) times daily with a meal.   Yes [provider]  omeprazole (PRILOSEC) 20 MG capsule Take 20 mg by mouth daily.   Yes [provider]     Physical Exam: Her voice is doing well with no difficulty breathing and no difficulty swallowing although she complains of dry throat.   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  Plan: I would strongly recommend that she discontinue smoking altogether. She will continue with the omeprazole for another 2 weeks. Recommended drinking plenty of liquids. She will follow-up in 6 months for recheck.   Radene Journey, MD

## 2019-09-23 ENCOUNTER — Encounter (INDEPENDENT_AMBULATORY_CARE_PROVIDER_SITE_OTHER): Payer: Self-pay

## 2019-12-09 DIAGNOSIS — Z6826 Body mass index (BMI) 26.0-26.9, adult: Secondary | ICD-10-CM | POA: Diagnosis not present

## 2019-12-09 DIAGNOSIS — E1169 Type 2 diabetes mellitus with other specified complication: Secondary | ICD-10-CM | POA: Diagnosis not present

## 2019-12-09 DIAGNOSIS — I10 Essential (primary) hypertension: Secondary | ICD-10-CM | POA: Diagnosis not present

## 2019-12-09 DIAGNOSIS — J309 Allergic rhinitis, unspecified: Secondary | ICD-10-CM | POA: Diagnosis not present

## 2020-01-30 DIAGNOSIS — Z1231 Encounter for screening mammogram for malignant neoplasm of breast: Secondary | ICD-10-CM | POA: Diagnosis not present

## 2020-03-19 ENCOUNTER — Encounter (INDEPENDENT_AMBULATORY_CARE_PROVIDER_SITE_OTHER): Payer: Self-pay | Admitting: Otolaryngology

## 2020-03-19 ENCOUNTER — Other Ambulatory Visit: Payer: Self-pay

## 2020-03-19 ENCOUNTER — Ambulatory Visit (INDEPENDENT_AMBULATORY_CARE_PROVIDER_SITE_OTHER): Payer: BC Managed Care – PPO | Admitting: Otolaryngology

## 2020-03-19 VITALS — Temp 96.6°F

## 2020-03-19 DIAGNOSIS — J383 Other diseases of vocal cords: Secondary | ICD-10-CM | POA: Diagnosis not present

## 2020-03-19 NOTE — Progress Notes (Signed)
HPI: Hannah [name] is a 59 y.o. female who returns today for evaluation of vocal cord dysplasia.  Patient is a smoker and is trying to quit smoking.  She has previously smoked about a pack a day and is now smoking about a half a pack a day.. She has no hoarseness although she does complain of her throat feeling tight or sore when she talks a lot.  She has no throat pain or discomfort when she swallows. She had previous direct laryngoscopy and biopsy performed on 09/12/2019 that showed dysplasia of the vocal cords. She returns today for follow-up.  Past Medical History:  Diagnosis Date  . Arthritis    feet  . Diabetes mellitus    type 2  . Dysrhythmia   . Family history of adverse reaction to anesthesia    father: post-op nausea and vomiting  . Heart murmur   . Hypertension   . Wears glasses   . Wears partial dentures    partial upper   Past Surgical History:  Procedure Laterality Date  . ABDOMINAL HYSTERECTOMY     in her 23s  . BREAST SURGERY  2 different occasions, over 5 years ago   cyst removal BIL  . CHOLECYSTECTOMY N/A 12/25/2016   Procedure: LYSIS OF ADHESIONS, LAPAROSCOPIC CHOLECYSTECTOMY WITH INTRAOPERATIVE CHOLANGIOGRAM;  Surgeon: Greer Pickerel, MD;  Location: [facility];  Service: General;  Laterality: N/A;  . COLON SURGERY  at least 6 yrs ago per patient   polyps and fissures   . DIAGNOSTIC LAPAROSCOPY  over 20 years ago   exploratory lap  . [place]   left  . INSERTION OF MESH N/A 11/13/2014   Procedure: INSERTION OF MESH;  Surgeon: Ralene Ok, MD;  Location: [place];  Service: General;  Laterality: N/A;  . MICROLARYNGOSCOPY N/A 05/19/2014   Procedure: MICROLARYNGOSCOPY WITH EXCISION OF VOCAL CORD LESION;  Surgeon: Rozetta Nunnery, MD;  Location: [place];  Service: ENT;  Laterality: N/A;  . MICROLARYNGOSCOPY WITH CO2 LASER AND EXCISION OF VOCAL CORD LESION N/A 09/05/2019   Procedure: MICROLARYNGOSCOPY WITH EXCISION OF VOCAL CORD  LESION;  Surgeon: Rozetta Nunnery, MD;  Location: [place];  Service: ENT;  Laterality: N/A;  . rectal fissure    . UMBILICAL HERNIA REPAIR N/A 11/13/2014   Procedure: LAPAROSCOPIC UMBILICAL HERNIA REPAIR WITH MESH;  Surgeon: Ralene Ok, MD;  Location: [place];  Service: General;  Laterality: N/A;   Social History   Socioeconomic History  . Marital status: Married    Spouse name: Not on file  . Number of children: Not on file  . Years of education: Not on file  . Highest education level: Not on file  Occupational History  . Not on file  Tobacco Use  . Smoking status: Current Every Day Smoker    Packs/day: 0.75    Years: 46.00    Pack years: 34.50    Types: Cigarettes    Start date: 56  . Smokeless tobacco: Never Used  Vaping Use  . Vaping Use: Never used  Substance and Sexual Activity  . Alcohol use: No  . Drug use: No  . Sexual activity: Yes  Other Topics Concern  . Not on file  Social History Narrative  . Not on file   Social Determinants of Health   Financial Resource Strain:   . Difficulty of Paying Living Expenses: Not on file  Food Insecurity:   . Worried About Charity fundraiser in the  Last Year: Not on file  . Ran Out of Food in the Last Year: Not on file  Transportation Needs:   . Lack of Transportation (Medical): Not on file  . Lack of Transportation (Non-Medical): Not on file  Physical Activity:   . Days of Exercise per Week: Not on file  . Minutes of Exercise per Session: Not on file  Stress:   . Feeling of Stress : Not on file  Social Connections:   . Frequency of Communication with Friends and Family: Not on file  . Frequency of Social Gatherings with Friends and Family: Not on file  . Attends Religious Services: Not on file  . Active Member of Clubs or Organizations: Not on file  . Attends Archivist Meetings: Not on file  . Marital Status: Not on file   No family history on file. Allergies  Allergen  Reactions  . Hydrochlorothiazide Other (See Comments)    Reaction:  Muscle spasms  . Latex Hives and Itching   Prior to Admission medications   Medication Sig Start Date End Date Taking? Authorizing Provider  acetaminophen (TYLENOL) 325 MG tablet Take 2 tablets (650 mg total) by mouth every 6 (six) hours as needed. Patient taking differently: Take 650 mg by mouth every 6 (six) hours as needed for mild pain.  12/26/16  Yes Simaan, Darci Current, PA-C  amLODipine (NORVASC) 5 MG tablet Take 5 mg by mouth daily.   Yes [provider]  chlorthalidone (HYGROTON) 25 MG tablet Take 25 mg by mouth daily.    Yes [provider]  empagliflozin (JARDIANCE) 10 MG TABS tablet Take 10 mg by mouth daily.   Yes [provider]  lisinopril (PRINIVIL,ZESTRIL) 40 MG tablet Take 40 mg by mouth daily.   Yes [provider]  metFORMIN (GLUCOPHAGE) 500 MG tablet Take 1,000 mg by mouth 2 (two) times daily with a meal.   Yes [provider]  omeprazole (PRILOSEC) 20 MG capsule Take 20 mg by mouth daily.   Yes [provider]     Positive ROS: Otherwise negative  All other systems have been reviewed and were otherwise negative with the exception of those mentioned in the HPI and as above.  Physical Exam: Constitutional: Alert, well-appearing, no acute distress.  She has no hoarseness on speech. Ears: External ears without lesions or tenderness. Ear canals are clear bilaterally with intact, clear TMs.  Nasal: External nose without lesions. Septum midline.. Clear nasal passages Oral: Lips and gums without lesions. Tongue and palate mucosa without lesions. Posterior oropharynx clear. Fiberoptic laryngoscopy was performed to the right nostril.  The nasopharynx was clear.  The base of tongue vallecula and epiglottis were normal.  Vocal cords were clear bilaterally with normal vocal cord mobility.  There is no leukoplakia noted.  Patient has strong gag reflex that required  Hurricaine spray. Neck: No palpable adenopathy or masses Respiratory: Breathing comfortably  Skin: No facial/neck lesions or rash noted.  Laryngoscopy  Date/Time: 03/19/2020 1:15 PM Performed by: Rozetta Nunnery, MD Authorized by: Rozetta Nunnery, MD   Consent:    Consent obtained:  Verbal   Consent given by:  Patient Procedure details:    Indications: direct visualization of the upper aerodigestive tract     Medication:  Afrin   Instrument: flexible fiberoptic laryngoscope     Scope location: right nare   Sinus:    Right nasopharynx: normal   Mouth:    Oropharynx: normal     Vallecula: normal  Base of tongue: normal     Epiglottis: normal   Throat:    True vocal cords: normal   Comments:     Vocal cords were clear bilaterally with no leukoplakia or abnormal lesions noted.    Assessment: Vocal cords were clear on exam today  Plan: Did not recommend any further follow-up visits unless she develops any hoarseness and reviewed this with her. Discussed with her concerning visit with speech therapy if she continues to get sore throat when she talks a lot. Also again would recommend stopping smoking .   Radene Journey, MD

## 2020-04-07 DIAGNOSIS — I10 Essential (primary) hypertension: Secondary | ICD-10-CM | POA: Diagnosis not present

## 2020-04-07 DIAGNOSIS — E1169 Type 2 diabetes mellitus with other specified complication: Secondary | ICD-10-CM | POA: Diagnosis not present

## 2020-04-12 DIAGNOSIS — I10 Essential (primary) hypertension: Secondary | ICD-10-CM | POA: Diagnosis not present

## 2020-04-12 DIAGNOSIS — E1169 Type 2 diabetes mellitus with other specified complication: Secondary | ICD-10-CM | POA: Diagnosis not present

## 2020-04-12 DIAGNOSIS — E785 Hyperlipidemia, unspecified: Secondary | ICD-10-CM | POA: Diagnosis not present

## 2020-08-12 DIAGNOSIS — F064 Anxiety disorder due to known physiological condition: Secondary | ICD-10-CM | POA: Diagnosis not present

## 2020-08-12 DIAGNOSIS — E785 Hyperlipidemia, unspecified: Secondary | ICD-10-CM | POA: Diagnosis not present

## 2020-08-12 DIAGNOSIS — I10 Essential (primary) hypertension: Secondary | ICD-10-CM | POA: Diagnosis not present

## 2020-08-12 DIAGNOSIS — E1169 Type 2 diabetes mellitus with other specified complication: Secondary | ICD-10-CM | POA: Diagnosis not present

## 2020-08-19 DIAGNOSIS — M79605 Pain in left leg: Secondary | ICD-10-CM | POA: Diagnosis not present

## 2020-08-19 DIAGNOSIS — I10 Essential (primary) hypertension: Secondary | ICD-10-CM | POA: Diagnosis not present

## 2020-08-19 DIAGNOSIS — E1169 Type 2 diabetes mellitus with other specified complication: Secondary | ICD-10-CM | POA: Diagnosis not present

## 2020-08-19 DIAGNOSIS — E1165 Type 2 diabetes mellitus with hyperglycemia: Secondary | ICD-10-CM | POA: Diagnosis not present

## 2020-09-17 DIAGNOSIS — R55 Syncope and collapse: Secondary | ICD-10-CM | POA: Diagnosis not present

## 2020-09-17 DIAGNOSIS — E162 Hypoglycemia, unspecified: Secondary | ICD-10-CM | POA: Diagnosis not present

## 2020-09-17 DIAGNOSIS — E1169 Type 2 diabetes mellitus with other specified complication: Secondary | ICD-10-CM | POA: Diagnosis not present

## 2020-09-17 DIAGNOSIS — I10 Essential (primary) hypertension: Secondary | ICD-10-CM | POA: Diagnosis not present

## 2020-10-01 DIAGNOSIS — E1169 Type 2 diabetes mellitus with other specified complication: Secondary | ICD-10-CM | POA: Diagnosis not present

## 2020-11-25 DIAGNOSIS — I10 Essential (primary) hypertension: Secondary | ICD-10-CM | POA: Diagnosis not present

## 2020-11-25 DIAGNOSIS — U071 COVID-19: Secondary | ICD-10-CM | POA: Diagnosis not present

## 2020-11-25 DIAGNOSIS — E1169 Type 2 diabetes mellitus with other specified complication: Secondary | ICD-10-CM | POA: Diagnosis not present

## 2020-11-25 DIAGNOSIS — F064 Anxiety disorder due to known physiological condition: Secondary | ICD-10-CM | POA: Diagnosis not present

## 2020-12-20 DIAGNOSIS — E1169 Type 2 diabetes mellitus with other specified complication: Secondary | ICD-10-CM | POA: Diagnosis not present

## 2020-12-20 DIAGNOSIS — E162 Hypoglycemia, unspecified: Secondary | ICD-10-CM | POA: Diagnosis not present

## 2020-12-20 DIAGNOSIS — E785 Hyperlipidemia, unspecified: Secondary | ICD-10-CM | POA: Diagnosis not present

## 2020-12-20 DIAGNOSIS — I10 Essential (primary) hypertension: Secondary | ICD-10-CM | POA: Diagnosis not present

## 2021-01-17 ENCOUNTER — Other Ambulatory Visit (HOSPITAL_COMMUNITY)
Admission: RE | Admit: 2021-01-17 | Discharge: 2021-01-17 | Disposition: A | Discharge status: Home / Self Care | Payer: BC Managed Care – PPO | Source: Ambulatory Visit | Attending: Family Medicine | Admitting: Family Medicine

## 2021-01-17 DIAGNOSIS — E1169 Type 2 diabetes mellitus with other specified complication: Secondary | ICD-10-CM | POA: Diagnosis not present

## 2021-01-17 DIAGNOSIS — N898 Other specified noninflammatory disorders of vagina: Secondary | ICD-10-CM | POA: Diagnosis not present

## 2021-01-17 DIAGNOSIS — Z124 Encounter for screening for malignant neoplasm of cervix: Secondary | ICD-10-CM | POA: Diagnosis not present

## 2021-01-17 DIAGNOSIS — N952 Postmenopausal atrophic vaginitis: Secondary | ICD-10-CM | POA: Diagnosis not present

## 2021-01-18 LAB — CYTOLOGY - PAP
Chlamydia: NEGATIVE
Comment: NEGATIVE
Comment: NORMAL
Diagnosis: NEGATIVE
Neisseria Gonorrhea: NEGATIVE

## 2021-01-18 LAB — MOLECULAR ANCILLARY ONLY
Bacterial Vaginitis (gardnerella): NEGATIVE
Candida Glabrata: NEGATIVE
Candida Vaginitis: POSITIVE — AB
Comment: NEGATIVE
Comment: NEGATIVE
Comment: NEGATIVE
Comment: NEGATIVE
Trichomonas: NEGATIVE

## 2021-01-31 DIAGNOSIS — Z720 Tobacco use: Secondary | ICD-10-CM | POA: Diagnosis not present

## 2021-01-31 DIAGNOSIS — E1169 Type 2 diabetes mellitus with other specified complication: Secondary | ICD-10-CM | POA: Diagnosis not present

## 2021-01-31 DIAGNOSIS — B373 Candidiasis of vulva and vagina: Secondary | ICD-10-CM | POA: Diagnosis not present

## 2021-02-25 DIAGNOSIS — Z1231 Encounter for screening mammogram for malignant neoplasm of breast: Secondary | ICD-10-CM | POA: Diagnosis not present

## 2022-04-03 ENCOUNTER — Ambulatory Visit
Admission: RE | Admit: 2022-04-03 | Discharge: 2022-04-03 | Disposition: A | Discharge status: Home / Self Care | Payer: BC Managed Care – PPO | Source: Ambulatory Visit | Attending: Family Medicine | Admitting: Family Medicine

## 2022-04-03 ENCOUNTER — Other Ambulatory Visit: Payer: Self-pay | Admitting: Family Medicine

## 2022-04-03 DIAGNOSIS — M25512 Pain in left shoulder: Secondary | ICD-10-CM

## 2022-04-10 ENCOUNTER — Other Ambulatory Visit: Payer: Self-pay | Admitting: Family Medicine

## 2022-04-10 DIAGNOSIS — M25512 Pain in left shoulder: Secondary | ICD-10-CM

## 2022-05-25 ENCOUNTER — Other Ambulatory Visit: Payer: BC Managed Care – PPO

## 2022-07-04 DIAGNOSIS — I10 Essential (primary) hypertension: Secondary | ICD-10-CM | POA: Diagnosis not present

## 2022-07-04 DIAGNOSIS — E1169 Type 2 diabetes mellitus with other specified complication: Secondary | ICD-10-CM | POA: Diagnosis not present

## 2022-07-04 DIAGNOSIS — E785 Hyperlipidemia, unspecified: Secondary | ICD-10-CM | POA: Diagnosis not present

## 2022-07-11 DIAGNOSIS — F419 Anxiety disorder, unspecified: Secondary | ICD-10-CM | POA: Diagnosis not present

## 2022-07-11 DIAGNOSIS — E1169 Type 2 diabetes mellitus with other specified complication: Secondary | ICD-10-CM | POA: Diagnosis not present

## 2022-07-11 DIAGNOSIS — E785 Hyperlipidemia, unspecified: Secondary | ICD-10-CM | POA: Diagnosis not present

## 2022-07-11 DIAGNOSIS — M7530 Calcific tendinitis of unspecified shoulder: Secondary | ICD-10-CM | POA: Diagnosis not present

## 2022-11-07 DIAGNOSIS — F419 Anxiety disorder, unspecified: Secondary | ICD-10-CM | POA: Diagnosis not present

## 2022-11-07 DIAGNOSIS — N3281 Overactive bladder: Secondary | ICD-10-CM | POA: Diagnosis not present

## 2022-11-07 DIAGNOSIS — Z6825 Body mass index (BMI) 25.0-25.9, adult: Secondary | ICD-10-CM | POA: Diagnosis not present

## 2022-11-07 DIAGNOSIS — Z01419 Encounter for gynecological examination (general) (routine) without abnormal findings: Secondary | ICD-10-CM | POA: Diagnosis not present

## 2022-11-07 DIAGNOSIS — E1169 Type 2 diabetes mellitus with other specified complication: Secondary | ICD-10-CM | POA: Diagnosis not present

## 2022-11-07 DIAGNOSIS — E785 Hyperlipidemia, unspecified: Secondary | ICD-10-CM | POA: Diagnosis not present

## 2022-11-09 DIAGNOSIS — E785 Hyperlipidemia, unspecified: Secondary | ICD-10-CM | POA: Diagnosis not present

## 2022-11-09 DIAGNOSIS — I10 Essential (primary) hypertension: Secondary | ICD-10-CM | POA: Diagnosis not present

## 2022-11-09 DIAGNOSIS — F419 Anxiety disorder, unspecified: Secondary | ICD-10-CM | POA: Diagnosis not present

## 2022-11-09 DIAGNOSIS — E1169 Type 2 diabetes mellitus with other specified complication: Secondary | ICD-10-CM | POA: Diagnosis not present

## 2022-11-17 DIAGNOSIS — Z20822 Contact with and (suspected) exposure to covid-19: Secondary | ICD-10-CM | POA: Diagnosis not present

## 2022-11-18 ENCOUNTER — Other Ambulatory Visit: Payer: Self-pay

## 2022-11-18 ENCOUNTER — Emergency Department (HOSPITAL_BASED_OUTPATIENT_CLINIC_OR_DEPARTMENT_OTHER)
Admission: EM | Admit: 2022-11-18 | Discharge: 2022-11-18 | Disposition: A | Discharge status: Home / Self Care | Payer: 59 | Attending: Emergency Medicine | Admitting: Emergency Medicine

## 2022-11-18 ENCOUNTER — Emergency Department (HOSPITAL_BASED_OUTPATIENT_CLINIC_OR_DEPARTMENT_OTHER): Payer: 59

## 2022-11-18 ENCOUNTER — Encounter (HOSPITAL_BASED_OUTPATIENT_CLINIC_OR_DEPARTMENT_OTHER): Payer: Self-pay

## 2022-11-18 DIAGNOSIS — Z91040 Latex allergy status: Secondary | ICD-10-CM | POA: Insufficient documentation

## 2022-11-18 DIAGNOSIS — R079 Chest pain, unspecified: Secondary | ICD-10-CM | POA: Diagnosis not present

## 2022-11-18 DIAGNOSIS — R140 Abdominal distension (gaseous): Secondary | ICD-10-CM | POA: Diagnosis not present

## 2022-11-18 DIAGNOSIS — Z79899 Other long term (current) drug therapy: Secondary | ICD-10-CM | POA: Insufficient documentation

## 2022-11-18 DIAGNOSIS — I700 Atherosclerosis of aorta: Secondary | ICD-10-CM | POA: Diagnosis not present

## 2022-11-18 DIAGNOSIS — E119 Type 2 diabetes mellitus without complications: Secondary | ICD-10-CM | POA: Diagnosis not present

## 2022-11-18 DIAGNOSIS — N644 Mastodynia: Secondary | ICD-10-CM | POA: Diagnosis not present

## 2022-11-18 DIAGNOSIS — U071 COVID-19: Secondary | ICD-10-CM | POA: Insufficient documentation

## 2022-11-18 DIAGNOSIS — R0789 Other chest pain: Secondary | ICD-10-CM | POA: Diagnosis not present

## 2022-11-18 DIAGNOSIS — Z7984 Long term (current) use of oral hypoglycemic drugs: Secondary | ICD-10-CM | POA: Insufficient documentation

## 2022-11-18 DIAGNOSIS — I10 Essential (primary) hypertension: Secondary | ICD-10-CM | POA: Insufficient documentation

## 2022-11-18 LAB — RESP PANEL BY RT-PCR (RSV, FLU A&B, COVID)  RVPGX2
Influenza A by PCR: NEGATIVE
Influenza B by PCR: NEGATIVE
Resp Syncytial Virus by PCR: NEGATIVE
SARS Coronavirus 2 by RT PCR: POSITIVE — AB

## 2022-11-18 LAB — BASIC METABOLIC PANEL
Anion gap: 10 (ref 5–15)
BUN: 16 mg/dL (ref 8–23)
CO2: 26 mmol/L (ref 22–32)
Calcium: 9.1 mg/dL (ref 8.9–10.3)
Chloride: 98 mmol/L (ref 98–111)
Creatinine, Ser: 0.92 mg/dL (ref 0.44–1.00)
GFR, Estimated: >60 mL/min (ref >60)
Glucose, Bld: 114 mg/dL — ABNORMAL HIGH (ref 70–99)
Potassium: 3.2 mmol/L — ABNORMAL LOW (ref 3.5–5.1)
Sodium: 134 mmol/L — ABNORMAL LOW (ref 135–145)

## 2022-11-18 LAB — CBC WITH DIFFERENTIAL/PLATELET
Abs Immature Granulocytes: 0.01 10*3/uL (ref 0.00–0.07)
Basophils Absolute: 0.1 10*3/uL (ref 0.0–0.1)
Basophils Relative: 1 %
Eosinophils Absolute: 0.4 10*3/uL (ref 0.0–0.5)
Eosinophils Relative: 6 %
HCT: 38.9 % (ref 36.0–46.0)
Hemoglobin: 13 g/dL (ref 12.0–15.0)
Immature Granulocytes: 0 %
Lymphocytes Relative: 46 %
Lymphs Abs: 3 10*3/uL (ref 0.7–4.0)
MCH: 27.8 pg (ref 26.0–34.0)
MCHC: 33.4 g/dL (ref 30.0–36.0)
MCV: 83.1 fL (ref 80.0–100.0)
Monocytes Absolute: 0.6 10*3/uL (ref 0.1–1.0)
Monocytes Relative: 9 %
Neutro Abs: 2.4 10*3/uL (ref 1.7–7.7)
Neutrophils Relative %: 38 %
Platelets: 341 10*3/uL (ref 150–400)
RBC: 4.68 MIL/uL (ref 3.87–5.11)
RDW: 14 % (ref 11.5–15.5)
WBC: 6.5 10*3/uL (ref 4.0–10.5)
nRBC: 0 % (ref 0.0–0.2)

## 2022-11-18 LAB — TROPONIN I (HIGH SENSITIVITY)
Troponin I (High Sensitivity): 2 ng/L (ref <18)
Troponin I (High Sensitivity): 2 ng/L (ref <18)

## 2022-11-18 MED ORDER — KETOROLAC TROMETHAMINE 30 MG/ML IJ SOLN
30.0000 mg | Freq: Once | INTRAMUSCULAR | Status: AC
  Administered 2022-11-18: 30 mg via INTRAVENOUS
  Filled 2022-11-18: qty 1

## 2022-11-18 MED ORDER — POTASSIUM CHLORIDE CRYS ER 20 MEQ PO TBCR
40.0000 meq | EXTENDED_RELEASE_TABLET | Freq: Once | ORAL | Status: AC
  Administered 2022-11-18: 40 meq via ORAL
  Filled 2022-11-18: qty 2

## 2022-11-18 MED ORDER — PAXLOVID (300/100) 20 X 150 MG & 10 X 100MG PO TBPK: 3.0000 | ORAL_TABLET | Freq: Two times a day (BID) | ORAL | 0 refills | Status: AC

## 2022-11-18 MED ORDER — MOLNUPIRAVIR EUA 200MG CAPSULE: 4.0000 | ORAL_CAPSULE | Freq: Two times a day (BID) | ORAL | 0 refills | Status: DC

## 2022-11-18 MED ORDER — IOHEXOL 350 MG/ML SOLN
75.0000 mL | Freq: Once | INTRAVENOUS | Status: AC | PRN
  Administered 2022-11-18: 75 mL via INTRAVENOUS

## 2022-11-18 NOTE — ED Provider Notes (Signed)
Patient has an associate who has also was seen by their PMD and is on a zpak, but has now tested positive for covid.  Patient would like me to advise her what to do for this individual.  EDP stated that I had not seen this patient and I cannot give advice on a patient I have not seen as I do not know what the physician who saw them heard or thought during their exam.  She countered with hypothetically what should she do.  EDP stated I cannot give out medical advice on a patient that is not present that I have not examined.     Hannah Golembeski, MD 11/18/22 [phone]

## 2022-11-18 NOTE — ED Provider Notes (Signed)
Patient signed out to me by previous provider. Please refer to their note for full HPI.  Briefly this is a 62 year old female who has been diagnosed COVID-positive at this visit.  She is pending CT PE study to rule out any complications with ongoing discomfort.  CT PE study identifies no other findings.  Discussed with patient symptomatic treatment of COVID and new medication that was prescribed by previous provider.  Will plan for outpatient follow-up.  Patient at this time appears safe and stable for discharge and close outpatient follow up. Discharge plan and strict return to ED precautions discussed, patient verbalizes understanding and agreement.   Rozelle Logan, [place] 11/18/22 9811

## 2022-11-18 NOTE — Discharge Instructions (Addendum)
Take new medication as prescribed. Stay well hydrated. Take Tylenol and or Ibuprofen for pain control. You may use over the counter medication for further symptom control.

## 2022-11-18 NOTE — ED Triage Notes (Signed)
Pt reports her son recently dx with COVID and she is now having symptoms. Pt with subjective fevers, chills, and is now having RT breast and RT chest pain that is sharp and shooting. Pt rx azithromycin and took 500mg  total so far.

## 2022-11-18 NOTE — ED Provider Notes (Signed)
EMERGENCY DEPARTMENT AT [facility] Provider Note   CSN: [id number] Arrival date & time: 11/18/22  0422     History  Chief Complaint  Patient presents with   Chest Pain    Hannah [name] is a 62 y.o. female.  The history is provided by the patient.  Chest Pain Pain location:  R chest (right breast) Pain quality: sharp   Pain severity:  Moderate Onset quality:  Sudden Duration: hours. Timing:  Constant Progression:  Unchanged Chronicity:  New Context: at rest   Relieved by:  Nothing Worsened by:  Nothing Ineffective treatments:  None tried Risk factors: not pregnant and no prior DVT/PE   Patient whose son was diagnosed with covid this week and patient was started on medication based on telehealth visit, medication is a Zpak.  No covid test performed.  Now with Right chest and breast pain.      Past Medical History:  Diagnosis Date   Arthritis    feet   Diabetes mellitus    type 2   Dysrhythmia    Family history of adverse reaction to anesthesia    father: post-op nausea and vomiting   Heart murmur    Hypertension    Wears glasses    Wears partial dentures    partial upper     Home Medications Prior to Admission medications   Medication Sig Start Date End Date Taking? Authorizing Provider  molnupiravir EUA (LAGEVRIO) 200 mg CAPS capsule Take 4 capsules (800 mg total) by mouth 2 (two) times daily for 5 days. 11/18/22 11/23/22 Yes Evon Lopezperez, MD  acetaminophen (TYLENOL) 325 MG tablet Take 2 tablets (650 mg total) by mouth every 6 (six) hours as needed. Patient taking differently: Take 650 mg by mouth every 6 (six) hours as needed for mild pain.  12/26/16   Adam Phenix, PA-C  amLODipine (NORVASC) 5 MG tablet Take 5 mg by mouth daily.    [provider]  chlorthalidone (HYGROTON) 25 MG tablet Take 25 mg by mouth daily.     [provider]  empagliflozin (JARDIANCE) 10 MG TABS tablet Take 10 mg by mouth daily.     [provider]  lisinopril (PRINIVIL,ZESTRIL) 40 MG tablet Take 40 mg by mouth daily.    [provider]  metFORMIN (GLUCOPHAGE) 500 MG tablet Take 1,000 mg by mouth 2 (two) times daily with a meal.    [provider]  omeprazole (PRILOSEC) 20 MG capsule Take 20 mg by mouth daily.    [provider]      Allergies    Hydrochlorothiazide and Latex    Review of Systems   Review of Systems  Respiratory:  Negative for wheezing and stridor.   Cardiovascular:  Positive for chest pain. Negative for leg swelling.  All other systems reviewed and are negative.   Physical Exam Updated Vital Signs BP 133/66   Pulse 75   Temp 99.3 F (37.4 C) (Oral)   Resp 19   Ht 5' (1.524 m)   Wt 62.1 kg   SpO2 99%   BMI 26.76 kg/m  Physical Exam Vitals and nursing note reviewed.  Constitutional:      General: She is not in acute distress.    Appearance: Normal appearance. She is well-developed.  HENT:     Head: Normocephalic and atraumatic.     Nose: Nose normal.  Eyes:     Pupils: Pupils are equal, round, and reactive to light.  Cardiovascular:  Rate and Rhythm: Normal rate and regular rhythm.     Pulses: Normal pulses.     Heart sounds: Normal heart sounds.  Pulmonary:     Effort: Pulmonary effort is normal. No respiratory distress.     Breath sounds: Normal breath sounds.  Abdominal:     General: Bowel sounds are normal. There is no distension.     Palpations: Abdomen is soft.     Tenderness: There is no abdominal tenderness. There is no guarding or rebound.  Genitourinary:    Vagina: No vaginal discharge.  Musculoskeletal:        General: Normal range of motion.     Cervical back: Normal range of motion and neck supple.  Skin:    General: Skin is warm and dry.     Capillary Refill: Capillary refill takes less than 2 seconds.     Findings: No erythema or rash.  Neurological:     General: No focal deficit present.     Mental Status: She is  alert and oriented to person, place, and time.     Deep Tendon Reflexes: Reflexes normal.  Psychiatric:        Mood and Affect: Mood normal.     ED Results / Procedures / Treatments   Labs (all labs ordered are listed, but only abnormal results are displayed) Results for orders placed or performed during the hospital encounter of 11/18/22  Resp panel by RT-PCR (RSV, Flu A&B, Covid) Anterior Nasal Swab   Specimen: Anterior Nasal Swab  Result Value Ref Range   SARS Coronavirus 2 by RT PCR POSITIVE (A) NEGATIVE   Influenza A by PCR NEGATIVE NEGATIVE   Influenza B by PCR NEGATIVE NEGATIVE   Resp Syncytial Virus by PCR NEGATIVE NEGATIVE  CBC with Differential  Result Value Ref Range   WBC 6.5 4.0 - 10.5 K/uL   RBC 4.68 3.87 - 5.11 MIL/uL   Hemoglobin 13.0 12.0 - 15.0 g/dL   HCT 40.9 81.1 - 91.4 %   MCV 83.1 80.0 - 100.0 fL   MCH 27.8 26.0 - 34.0 pg   MCHC 33.4 30.0 - 36.0 g/dL   RDW 78.2 95.6 - 21.3 %   Platelets 341 150 - 400 K/uL   nRBC 0.0 0.0 - 0.2 %   Neutrophils Relative % 38 %   Neutro Abs 2.4 1.7 - 7.7 K/uL   Lymphocytes Relative 46 %   Lymphs Abs 3.0 0.7 - 4.0 K/uL   Monocytes Relative 9 %   Monocytes Absolute 0.6 0.1 - 1.0 K/uL   Eosinophils Relative 6 %   Eosinophils Absolute 0.4 0.0 - 0.5 K/uL   Basophils Relative 1 %   Basophils Absolute 0.1 0.0 - 0.1 K/uL   Immature Granulocytes 0 %   Abs Immature Granulocytes 0.01 0.00 - 0.07 K/uL  Basic metabolic panel  Result Value Ref Range   Sodium 134 (L) 135 - 145 mmol/L   Potassium 3.2 (L) 3.5 - 5.1 mmol/L   Chloride 98 98 - 111 mmol/L   CO2 26 22 - 32 mmol/L   Glucose, Bld 114 (H) 70 - 99 mg/dL   BUN 16 8 - 23 mg/dL   Creatinine, Ser 0.86 0.44 - 1.00 mg/dL   Calcium 9.1 8.9 - 57.8 mg/dL   GFR, Estimated >46 >96 mL/min   Anion gap 10 5 - 15  Troponin I (High Sensitivity)  Result Value Ref Range   Troponin I (High Sensitivity) 2 <18 ng/L  Troponin I (High Sensitivity)  Result Value Ref Range   Troponin I  (High Sensitivity) 2 <18 ng/L   DG Chest Portable 1 View  Result Date: 11/18/2022 CLINICAL DATA:  62 year female with recent COVID-19 sick contacts. And now she is symptomatic. EXAM: PORTABLE CHEST 1 VIEW COMPARISON:  CTA chest 01/08/2012 and earlier. FINDINGS: Portable AP upright view at 0453 hours. Larger lung volumes compared to 2013. Mediastinal contours remain normal. Visualized tracheal air column is within normal limits. Lung markings, with mild basilar predominant increased interstitium, not significantly changed from 2005. Allowing for portable technique the lungs are clear. No pneumothorax or pleural effusion. No acute osseous abnormality identified. Paucity of bowel gas in the visible abdomen. IMPRESSION: No acute cardiopulmonary abnormality. Electronically Signed   By: Odessa Fleming M.D.   On: 11/18/2022 05:38     EKG EKG Interpretation Date/Time:  Saturday November 18 2022 04:37:26 EDT Ventricular Rate:  93 PR Interval:  168 QRS Duration:  83 QT Interval:  355 QTC Calculation: 442 R Axis:   76  Text Interpretation: Sinus rhythm Confirmed by Sidra Oldfield ([id number]) on 11/18/2022 4:40:50 AM  Radiology DG Chest Portable 1 View  Result Date: 11/18/2022 CLINICAL DATA:  62 year female with recent COVID-19 sick contacts. And now she is symptomatic. EXAM: PORTABLE CHEST 1 VIEW COMPARISON:  CTA chest 01/08/2012 and earlier. FINDINGS: Portable AP upright view at 0453 hours. Larger lung volumes compared to 2013. Mediastinal contours remain normal. Visualized tracheal air column is within normal limits. Lung markings, with mild basilar predominant increased interstitium, not significantly changed from 2005. Allowing for portable technique the lungs are clear. No pneumothorax or pleural effusion. No acute osseous abnormality identified. Paucity of bowel gas in the visible abdomen. IMPRESSION: No acute cardiopulmonary abnormality. Electronically Signed   By: Odessa Fleming M.D.   On: 11/18/2022 05:38     Procedures Procedures    Medications Ordered in ED Medications  potassium chloride SA (KLOR-CON M) CR tablet 40 mEq (40 mEq Oral Given 11/18/22 0553)  ketorolac (TORADOL) 30 MG/ML injection 30 mg (30 mg Intravenous Given 11/18/22 0553)  iohexol (OMNIPAQUE) 350 MG/ML injection 75 mL (75 mLs Intravenous Contrast Given 11/18/22 5284)    ED Course/ Medical Decision Making/ A&P                             Medical Decision Making Amount and/or Complexity of Data Reviewed External Data Reviewed: notes.    Details: Previous notes reviewed Labs: ordered.    Details: Covid is positive. 2 negative troponins 2/2. Normal white count 6.5, normal hemoglobin 13, normal platelets.  Sodium 134, potassium slight low 3.2 normal creatinine  Radiology: ordered and independent interpretation performed.    Details: Negative CXR by note  ECG/medicine tests: ordered and independent interpretation performed. Decision-making details documented in ED Course.  Risk Prescription drug management. Risk Details: Patient has ruled out for MI in the ED.  CTA pending    Final Clinical Impression(s) / ED Diagnoses Final diagnoses:  COVID-19   Signed out to Dr. Wilkie Aye pending CTA  Rx / DC Orders ED Discharge Orders          Ordered    molnupiravir EUA (LAGEVRIO) 200 mg CAPS capsule  2 times daily        11/18/22 0540              Eufelia Veno, MD 11/18/22 1324

## 2022-11-18 NOTE — ED Notes (Signed)
ED Provider at bedside. 

## 2023-02-26 DIAGNOSIS — E1169 Type 2 diabetes mellitus with other specified complication: Secondary | ICD-10-CM | POA: Diagnosis not present

## 2023-02-26 DIAGNOSIS — E785 Hyperlipidemia, unspecified: Secondary | ICD-10-CM | POA: Diagnosis not present

## 2023-02-26 DIAGNOSIS — I10 Essential (primary) hypertension: Secondary | ICD-10-CM | POA: Diagnosis not present

## 2023-02-26 DIAGNOSIS — E559 Vitamin D deficiency, unspecified: Secondary | ICD-10-CM | POA: Diagnosis not present

## 2023-03-15 DIAGNOSIS — F419 Anxiety disorder, unspecified: Secondary | ICD-10-CM | POA: Diagnosis not present

## 2023-03-15 DIAGNOSIS — E1169 Type 2 diabetes mellitus with other specified complication: Secondary | ICD-10-CM | POA: Diagnosis not present

## 2023-03-22 DIAGNOSIS — Z1231 Encounter for screening mammogram for malignant neoplasm of breast: Secondary | ICD-10-CM | POA: Diagnosis not present

## 2023-06-14 DIAGNOSIS — Z1211 Encounter for screening for malignant neoplasm of colon: Secondary | ICD-10-CM | POA: Diagnosis not present

## 2023-06-14 DIAGNOSIS — K5900 Constipation, unspecified: Secondary | ICD-10-CM | POA: Diagnosis not present

## 2023-07-10 DIAGNOSIS — E1169 Type 2 diabetes mellitus with other specified complication: Secondary | ICD-10-CM | POA: Diagnosis not present

## 2023-07-10 DIAGNOSIS — E782 Mixed hyperlipidemia: Secondary | ICD-10-CM | POA: Diagnosis not present

## 2023-07-10 DIAGNOSIS — I10 Essential (primary) hypertension: Secondary | ICD-10-CM | POA: Diagnosis not present

## 2023-07-17 DIAGNOSIS — I10 Essential (primary) hypertension: Secondary | ICD-10-CM | POA: Diagnosis not present

## 2023-07-17 DIAGNOSIS — E1169 Type 2 diabetes mellitus with other specified complication: Secondary | ICD-10-CM | POA: Diagnosis not present

## 2023-07-17 DIAGNOSIS — E785 Hyperlipidemia, unspecified: Secondary | ICD-10-CM | POA: Diagnosis not present

## 2023-09-27 DIAGNOSIS — M542 Cervicalgia: Secondary | ICD-10-CM | POA: Diagnosis not present

## 2023-09-27 DIAGNOSIS — M7531 Calcific tendinitis of right shoulder: Secondary | ICD-10-CM | POA: Diagnosis not present

## 2023-10-03 DIAGNOSIS — M25511 Pain in right shoulder: Secondary | ICD-10-CM | POA: Diagnosis not present

## 2023-10-03 DIAGNOSIS — M25611 Stiffness of right shoulder, not elsewhere classified: Secondary | ICD-10-CM | POA: Diagnosis not present

## 2023-10-03 DIAGNOSIS — R531 Weakness: Secondary | ICD-10-CM | POA: Diagnosis not present

## 2023-10-03 DIAGNOSIS — R519 Headache, unspecified: Secondary | ICD-10-CM | POA: Diagnosis not present

## 2023-10-10 DIAGNOSIS — M25611 Stiffness of right shoulder, not elsewhere classified: Secondary | ICD-10-CM | POA: Diagnosis not present

## 2023-10-10 DIAGNOSIS — M25511 Pain in right shoulder: Secondary | ICD-10-CM | POA: Diagnosis not present

## 2023-10-10 DIAGNOSIS — R519 Headache, unspecified: Secondary | ICD-10-CM | POA: Diagnosis not present

## 2023-10-10 DIAGNOSIS — R531 Weakness: Secondary | ICD-10-CM | POA: Diagnosis not present

## 2023-10-12 DIAGNOSIS — R519 Headache, unspecified: Secondary | ICD-10-CM | POA: Diagnosis not present

## 2023-10-12 DIAGNOSIS — M25511 Pain in right shoulder: Secondary | ICD-10-CM | POA: Diagnosis not present

## 2023-10-12 DIAGNOSIS — R531 Weakness: Secondary | ICD-10-CM | POA: Diagnosis not present

## 2023-10-12 DIAGNOSIS — M25611 Stiffness of right shoulder, not elsewhere classified: Secondary | ICD-10-CM | POA: Diagnosis not present

## 2023-10-16 DIAGNOSIS — M25511 Pain in right shoulder: Secondary | ICD-10-CM | POA: Diagnosis not present

## 2023-10-16 DIAGNOSIS — M25611 Stiffness of right shoulder, not elsewhere classified: Secondary | ICD-10-CM | POA: Diagnosis not present

## 2023-10-16 DIAGNOSIS — R531 Weakness: Secondary | ICD-10-CM | POA: Diagnosis not present

## 2023-10-16 DIAGNOSIS — R519 Headache, unspecified: Secondary | ICD-10-CM | POA: Diagnosis not present

## 2023-10-23 DIAGNOSIS — R519 Headache, unspecified: Secondary | ICD-10-CM | POA: Diagnosis not present

## 2023-10-23 DIAGNOSIS — M25611 Stiffness of right shoulder, not elsewhere classified: Secondary | ICD-10-CM | POA: Diagnosis not present

## 2023-10-23 DIAGNOSIS — R531 Weakness: Secondary | ICD-10-CM | POA: Diagnosis not present

## 2023-10-23 DIAGNOSIS — M25511 Pain in right shoulder: Secondary | ICD-10-CM | POA: Diagnosis not present

## 2023-11-02 DIAGNOSIS — R531 Weakness: Secondary | ICD-10-CM | POA: Diagnosis not present

## 2023-11-02 DIAGNOSIS — M25511 Pain in right shoulder: Secondary | ICD-10-CM | POA: Diagnosis not present

## 2023-11-02 DIAGNOSIS — M25611 Stiffness of right shoulder, not elsewhere classified: Secondary | ICD-10-CM | POA: Diagnosis not present

## 2023-11-02 DIAGNOSIS — R519 Headache, unspecified: Secondary | ICD-10-CM | POA: Diagnosis not present

## 2023-11-06 DIAGNOSIS — M7501 Adhesive capsulitis of right shoulder: Secondary | ICD-10-CM | POA: Diagnosis not present

## 2023-11-06 DIAGNOSIS — M25511 Pain in right shoulder: Secondary | ICD-10-CM | POA: Diagnosis not present

## 2023-11-06 DIAGNOSIS — M7531 Calcific tendinitis of right shoulder: Secondary | ICD-10-CM | POA: Diagnosis not present

## 2023-11-15 DIAGNOSIS — M25511 Pain in right shoulder: Secondary | ICD-10-CM | POA: Diagnosis not present

## 2023-11-21 DIAGNOSIS — M7531 Calcific tendinitis of right shoulder: Secondary | ICD-10-CM | POA: Diagnosis not present

## 2023-12-04 DIAGNOSIS — E559 Vitamin D deficiency, unspecified: Secondary | ICD-10-CM | POA: Diagnosis not present

## 2023-12-04 DIAGNOSIS — E1169 Type 2 diabetes mellitus with other specified complication: Secondary | ICD-10-CM | POA: Diagnosis not present

## 2023-12-04 DIAGNOSIS — E785 Hyperlipidemia, unspecified: Secondary | ICD-10-CM | POA: Diagnosis not present

## 2023-12-04 DIAGNOSIS — I10 Essential (primary) hypertension: Secondary | ICD-10-CM | POA: Diagnosis not present

## 2023-12-06 DIAGNOSIS — I499 Cardiac arrhythmia, unspecified: Secondary | ICD-10-CM | POA: Diagnosis not present

## 2023-12-06 DIAGNOSIS — F419 Anxiety disorder, unspecified: Secondary | ICD-10-CM | POA: Diagnosis not present

## 2023-12-06 DIAGNOSIS — G4730 Sleep apnea, unspecified: Secondary | ICD-10-CM | POA: Diagnosis not present

## 2023-12-06 DIAGNOSIS — E1169 Type 2 diabetes mellitus with other specified complication: Secondary | ICD-10-CM | POA: Diagnosis not present

## 2023-12-06 DIAGNOSIS — M25512 Pain in left shoulder: Secondary | ICD-10-CM | POA: Diagnosis not present

## 2023-12-06 DIAGNOSIS — E7800 Pure hypercholesterolemia, unspecified: Secondary | ICD-10-CM | POA: Diagnosis not present

## 2023-12-06 DIAGNOSIS — I10 Essential (primary) hypertension: Secondary | ICD-10-CM | POA: Diagnosis not present

## 2023-12-18 DIAGNOSIS — M7531 Calcific tendinitis of right shoulder: Secondary | ICD-10-CM | POA: Diagnosis not present

## 2023-12-18 DIAGNOSIS — M7581 Other shoulder lesions, right shoulder: Secondary | ICD-10-CM | POA: Diagnosis not present

## 2023-12-18 DIAGNOSIS — M7551 Bursitis of right shoulder: Secondary | ICD-10-CM | POA: Diagnosis not present

## 2023-12-18 DIAGNOSIS — M24111 Other articular cartilage disorders, right shoulder: Secondary | ICD-10-CM | POA: Diagnosis not present

## 2023-12-28 DIAGNOSIS — M7541 Impingement syndrome of right shoulder: Secondary | ICD-10-CM | POA: Diagnosis not present

## 2023-12-28 DIAGNOSIS — M7531 Calcific tendinitis of right shoulder: Secondary | ICD-10-CM | POA: Diagnosis not present

## 2023-12-31 DIAGNOSIS — M7541 Impingement syndrome of right shoulder: Secondary | ICD-10-CM | POA: Diagnosis not present

## 2023-12-31 DIAGNOSIS — M7531 Calcific tendinitis of right shoulder: Secondary | ICD-10-CM | POA: Diagnosis not present

## 2024-01-02 DIAGNOSIS — M7531 Calcific tendinitis of right shoulder: Secondary | ICD-10-CM | POA: Diagnosis not present

## 2024-01-02 DIAGNOSIS — M7541 Impingement syndrome of right shoulder: Secondary | ICD-10-CM | POA: Diagnosis not present

## 2024-01-04 DIAGNOSIS — M7541 Impingement syndrome of right shoulder: Secondary | ICD-10-CM | POA: Diagnosis not present

## 2024-01-04 DIAGNOSIS — M7531 Calcific tendinitis of right shoulder: Secondary | ICD-10-CM | POA: Diagnosis not present

## 2024-01-07 DIAGNOSIS — M7531 Calcific tendinitis of right shoulder: Secondary | ICD-10-CM | POA: Diagnosis not present

## 2024-01-07 DIAGNOSIS — M7541 Impingement syndrome of right shoulder: Secondary | ICD-10-CM | POA: Diagnosis not present

## 2024-01-09 DIAGNOSIS — M7541 Impingement syndrome of right shoulder: Secondary | ICD-10-CM | POA: Diagnosis not present

## 2024-01-09 DIAGNOSIS — M7531 Calcific tendinitis of right shoulder: Secondary | ICD-10-CM | POA: Diagnosis not present

## 2024-01-11 DIAGNOSIS — M7531 Calcific tendinitis of right shoulder: Secondary | ICD-10-CM | POA: Diagnosis not present

## 2024-01-11 DIAGNOSIS — M7541 Impingement syndrome of right shoulder: Secondary | ICD-10-CM | POA: Diagnosis not present

## 2024-01-14 DIAGNOSIS — M7531 Calcific tendinitis of right shoulder: Secondary | ICD-10-CM | POA: Diagnosis not present

## 2024-01-14 DIAGNOSIS — M7541 Impingement syndrome of right shoulder: Secondary | ICD-10-CM | POA: Diagnosis not present

## 2024-01-16 DIAGNOSIS — M7531 Calcific tendinitis of right shoulder: Secondary | ICD-10-CM | POA: Diagnosis not present

## 2024-01-16 DIAGNOSIS — M7541 Impingement syndrome of right shoulder: Secondary | ICD-10-CM | POA: Diagnosis not present

## 2024-01-18 DIAGNOSIS — M7541 Impingement syndrome of right shoulder: Secondary | ICD-10-CM | POA: Diagnosis not present

## 2024-01-18 DIAGNOSIS — M7531 Calcific tendinitis of right shoulder: Secondary | ICD-10-CM | POA: Diagnosis not present

## 2024-01-22 DIAGNOSIS — M7541 Impingement syndrome of right shoulder: Secondary | ICD-10-CM | POA: Diagnosis not present

## 2024-01-22 DIAGNOSIS — M7531 Calcific tendinitis of right shoulder: Secondary | ICD-10-CM | POA: Diagnosis not present

## 2024-01-24 DIAGNOSIS — M25511 Pain in right shoulder: Secondary | ICD-10-CM | POA: Diagnosis not present

## 2024-01-24 DIAGNOSIS — M7541 Impingement syndrome of right shoulder: Secondary | ICD-10-CM | POA: Diagnosis not present

## 2024-01-24 DIAGNOSIS — M7531 Calcific tendinitis of right shoulder: Secondary | ICD-10-CM | POA: Diagnosis not present

## 2024-01-25 DIAGNOSIS — M7531 Calcific tendinitis of right shoulder: Secondary | ICD-10-CM | POA: Diagnosis not present

## 2024-01-25 DIAGNOSIS — M7541 Impingement syndrome of right shoulder: Secondary | ICD-10-CM | POA: Diagnosis not present

## 2024-02-08 DIAGNOSIS — Z13820 Encounter for screening for osteoporosis: Secondary | ICD-10-CM | POA: Diagnosis not present

## 2024-02-20 DIAGNOSIS — E1169 Type 2 diabetes mellitus with other specified complication: Secondary | ICD-10-CM | POA: Diagnosis not present

## 2024-02-20 DIAGNOSIS — G4730 Sleep apnea, unspecified: Secondary | ICD-10-CM | POA: Diagnosis not present

## 2024-02-20 DIAGNOSIS — E7800 Pure hypercholesterolemia, unspecified: Secondary | ICD-10-CM | POA: Diagnosis not present

## 2024-02-20 DIAGNOSIS — F064 Anxiety disorder due to known physiological condition: Secondary | ICD-10-CM | POA: Diagnosis not present

## 2024-02-28 DIAGNOSIS — E785 Hyperlipidemia, unspecified: Secondary | ICD-10-CM | POA: Diagnosis not present

## 2024-02-28 DIAGNOSIS — E1165 Type 2 diabetes mellitus with hyperglycemia: Secondary | ICD-10-CM | POA: Diagnosis not present

## 2024-02-28 DIAGNOSIS — I10 Essential (primary) hypertension: Secondary | ICD-10-CM | POA: Diagnosis not present

## 2024-03-11 DIAGNOSIS — M7541 Impingement syndrome of right shoulder: Secondary | ICD-10-CM | POA: Diagnosis not present

## 2024-03-11 DIAGNOSIS — M7531 Calcific tendinitis of right shoulder: Secondary | ICD-10-CM | POA: Diagnosis not present

## 2024-03-13 DIAGNOSIS — E119 Type 2 diabetes mellitus without complications: Secondary | ICD-10-CM | POA: Diagnosis not present

## 2024-03-13 DIAGNOSIS — H25813 Combined forms of age-related cataract, bilateral: Secondary | ICD-10-CM | POA: Diagnosis not present

## 2024-03-17 DIAGNOSIS — M7531 Calcific tendinitis of right shoulder: Secondary | ICD-10-CM | POA: Diagnosis not present

## 2024-03-17 DIAGNOSIS — M7541 Impingement syndrome of right shoulder: Secondary | ICD-10-CM | POA: Diagnosis not present

## 2024-03-21 DIAGNOSIS — M7541 Impingement syndrome of right shoulder: Secondary | ICD-10-CM | POA: Diagnosis not present

## 2024-03-21 DIAGNOSIS — M7531 Calcific tendinitis of right shoulder: Secondary | ICD-10-CM | POA: Diagnosis not present

## 2024-03-21 DIAGNOSIS — I499 Cardiac arrhythmia, unspecified: Secondary | ICD-10-CM | POA: Diagnosis not present

## 2024-03-27 DIAGNOSIS — Z1231 Encounter for screening mammogram for malignant neoplasm of breast: Secondary | ICD-10-CM | POA: Diagnosis not present

## 2024-03-28 DIAGNOSIS — M7531 Calcific tendinitis of right shoulder: Secondary | ICD-10-CM | POA: Diagnosis not present

## 2024-03-28 DIAGNOSIS — M7541 Impingement syndrome of right shoulder: Secondary | ICD-10-CM | POA: Diagnosis not present

## 2024-04-03 DIAGNOSIS — G8929 Other chronic pain: Secondary | ICD-10-CM | POA: Diagnosis not present

## 2024-04-03 DIAGNOSIS — M25511 Pain in right shoulder: Secondary | ICD-10-CM | POA: Diagnosis not present

## 2024-04-07 DIAGNOSIS — N6315 Unspecified lump in the right breast, overlapping quadrants: Secondary | ICD-10-CM | POA: Diagnosis not present

## 2024-04-07 DIAGNOSIS — R921 Mammographic calcification found on diagnostic imaging of breast: Secondary | ICD-10-CM | POA: Diagnosis not present

## 2024-04-08 DIAGNOSIS — I498 Other specified cardiac arrhythmias: Secondary | ICD-10-CM | POA: Diagnosis not present

## 2024-04-08 DIAGNOSIS — I4729 Other ventricular tachycardia: Secondary | ICD-10-CM | POA: Diagnosis not present

## 2024-04-08 DIAGNOSIS — I4719 Other supraventricular tachycardia: Secondary | ICD-10-CM | POA: Diagnosis not present

## 2024-04-15 ENCOUNTER — Other Ambulatory Visit: Payer: Self-pay | Admitting: Radiology

## 2024-04-15 DIAGNOSIS — N6315 Unspecified lump in the right breast, overlapping quadrants: Secondary | ICD-10-CM | POA: Diagnosis not present

## 2024-04-16 ENCOUNTER — Telehealth: Payer: Self-pay | Admitting: *Deleted

## 2024-04-16 LAB — SURGICAL PATHOLOGY

## 2024-04-16 NOTE — Progress Notes (Signed)
 Radiation Oncology         [phone] ________________________________  Name: Hannah [name]        MRN: [id number]  Date of Service: 04/23/2024 DOB: 1960/09/04  RR:Aojwi, Kennieth, MD  Ebbie Cough, MD     REFERRING PHYSICIAN: Ebbie Cough, MD   DIAGNOSIS: The encounter diagnosis was Malignant neoplasm of lower-inner quadrant of right breast of female, estrogen receptor negative (HCC).   HISTORY OF PRESENT ILLNESS: Hannah [name] is a 63 y.o. female seen in the multidisciplinary breast clinic for a new diagnosis of right breast cancer. The patient was noted to have a screening detected mass in the lower middle quadrant of the right breast. Additional diagnostic imaginag showed a 2.3 cm mass. By ultrasound the lesion located in the 3-4:00 position measuring 2.4 cm and her axilla was negative for adenopathy. The lesion was friable per the report of the radiologist and was difficult to sample. A biopsy on 04/15/24  showed at least DCIS with apocrine features with necrosis and concerns for invasive ductal carcinoma. The tumor was ER/PR negative, HER2 negative with a Ki 67 of 5%.  She's seen today to discuss treatment of her cancer.    PREVIOUS RADIATION THERAPY: No   PAST MEDICAL HISTORY:  Past Medical History:  Diagnosis Date   Arthritis    feet   Diabetes mellitus    type 2   Dysrhythmia    Family history of adverse reaction to anesthesia    father: post-op nausea and vomiting   Heart murmur    Hypertension    Wears glasses    Wears partial dentures    partial upper       PAST SURGICAL HISTORY: Past Surgical History:  Procedure Laterality Date   ABDOMINAL HYSTERECTOMY     in her 30s   BREAST SURGERY  2 different occasions, over 5 years ago   cyst removal BIL   CHOLECYSTECTOMY N/A 12/25/2016   Procedure: LYSIS OF ADHESIONS, LAPAROSCOPIC CHOLECYSTECTOMY WITH INTRAOPERATIVE CHOLANGIOGRAM;  Surgeon: Tanda Locus, MD;  Location: [facility];  Service: General;   Laterality: N/A;   COLON SURGERY  at least 6 yrs ago per patient   polyps and fissures    DIAGNOSTIC LAPAROSCOPY  over 20 years ago   exploratory lap   FOOT SURGERY  1985   left   INSERTION OF MESH N/A 11/13/2014   Procedure: INSERTION OF MESH;  Surgeon: Lynda Leos, MD;  Location: [facility];  Service: General;  Laterality: N/A;   MICROLARYNGOSCOPY N/A 05/19/2014   Procedure: MICROLARYNGOSCOPY WITH EXCISION OF VOCAL CORD LESION;  Surgeon: Lonni FORBES Angle, MD;  Location: [facility];  Service: ENT;  Laterality: N/A;   MICROLARYNGOSCOPY WITH CO2 LASER AND EXCISION OF VOCAL CORD LESION N/A 09/05/2019   Procedure: MICROLARYNGOSCOPY WITH EXCISION OF VOCAL CORD LESION;  Surgeon: Angle Lonni FORBES, MD;  Location: [facility];  Service: ENT;  Laterality: N/A;   rectal fissure     UMBILICAL HERNIA REPAIR N/A 11/13/2014   Procedure: LAPAROSCOPIC UMBILICAL HERNIA REPAIR WITH MESH;  Surgeon: Lynda Leos, MD;  Location: [facility];  Service: General;  Laterality: N/A;     FAMILY HISTORY: No family history on file.   SOCIAL HISTORY:  reports that she has been smoking cigarettes. She started smoking about 54 years ago. She has a 41.2 pack-year smoking history. She has never used smokeless tobacco. She reports that she does not drink alcohol and does not use drugs. The patient is married  and lives in [place]. She is accompanied by her husband and sister. She spends time with her family and enjoys being active in her church. She has a disabled adult son whom she cares for as well.    ALLERGIES: Hydrochlorothiazide and Latex   MEDICATIONS:  Current Outpatient Medications  Medication Sig Dispense Refill   acetaminophen  (TYLENOL ) 325 MG tablet Take 2 tablets (650 mg total) by mouth every 6 (six) hours as needed. (Patient taking differently: Take 650 mg by mouth every 6 (six) hours as needed for mild pain. )     amLODipine  (NORVASC ) 5 MG tablet Take 5 mg by mouth daily.      chlorthalidone (HYGROTON) 25 MG tablet Take 25 mg by mouth daily.      empagliflozin (JARDIANCE) 10 MG TABS tablet Take 10 mg by mouth daily.     lisinopril  (PRINIVIL ,ZESTRIL ) 40 MG tablet Take 40 mg by mouth daily.     metFORMIN  (GLUCOPHAGE ) 500 MG tablet Take 1,000 mg by mouth 2 (two) times daily with a meal.     omeprazole (PRILOSEC) 20 MG capsule Take 20 mg by mouth daily.     No current facility-administered medications for this encounter.     REVIEW OF SYSTEMS: On review of systems, the patient reports that she is doing well overall but is concerned about the recommendations she will have for treatment, but glad to know there is a plan being put in place for what to do. No other complaints are verbalized.      PHYSICAL EXAM:  Wt Readings from Last 3 Encounters:  04/23/24 133 lb 14.4 oz (60.7 kg)  11/18/22 137 lb (62.1 kg)  09/05/19 140 lb 14 oz (63.9 kg)   Temp Readings from Last 3 Encounters:  04/23/24 98.2 F (36.8 C) (Tympanic)  11/18/22 98.3 F (36.8 C) (Oral)  03/19/20 (!) 96.6 F (35.9 C) (Tympanic)   BP Readings from Last 3 Encounters:  04/23/24 115/68  11/18/22 111/73  09/05/19 133/73   Pulse Readings from Last 3 Encounters:  04/23/24 87  11/18/22 71  09/05/19 86    In general this is a well appearing African American female in no acute distress. She's alert and oriented x4 and appropriate throughout the examination. Cardiopulmonary assessment is negative for acute distress and she exhibits normal effort. Bilateral breast exam is deferred.    ECOG = 1  0 - Asymptomatic (Fully active, able to carry on all predisease activities without restriction)  1 - Symptomatic but completely ambulatory (Restricted in physically strenuous activity but ambulatory and able to carry out work of a light or sedentary nature. For example, light housework, office work)  2 - Symptomatic, <50% in bed during the day (Ambulatory and capable of all self care but unable to  carry out any work activities. Up and about more than 50% of waking hours)  3 - Symptomatic, >50% in bed, but not bedbound (Capable of only limited self-care, confined to bed or chair 50% or more of waking hours)  4 - Bedbound (Completely disabled. Cannot carry on any self-care. Totally confined to bed or chair)  5 - Death   Raylene MM, Creech RH, Tormey DC, et al. [phone]). Toxicity and response criteria of the [facility]. Am. DOROTHA Bridges. Oncol. 5 (6): 649-55    LABORATORY DATA:  Lab Results  Component Value Date   WBC 8.5 04/23/2024   HGB 13.9 04/23/2024   HCT 42.0 04/23/2024   MCV 83.3 04/23/2024   PLT 451 (  H) 04/23/2024   Lab Results  Component Value Date   NA 134 (L) 11/18/2022   K 3.2 (L) 11/18/2022   CL 98 11/18/2022   CO2 26 11/18/2022   Lab Results  Component Value Date   ALT 60 (H) 12/26/2016   AST 58 (H) 12/26/2016   ALKPHOS 65 12/26/2016   BILITOT 0.4 12/26/2016      RADIOGRAPHY: No results found.     IMPRESSION/PLAN: 1. Probable Stage IIA, cT2N0M0, triple negative carcinoma of the right breast. Dr. Dewey discusses the pathology findings and reviews the nature of right breast disease. The consensus from the breast conference includes additional contrast enhanced mammogram and biopsy of the primary lesion. Pathology was concerned in breast oncology conference that while the specimen was consistent with noninvasive disease, there appeared to be more of a concern for invasive disease. Essentially the biopsy was discordant to the imaging findings. Dr. Loretha recommends that if invasive disease is confirmed, that she receive neoadjuvant chemotherapy. Dr. Ebbie has recommended breast conservation with lumpectomy with sentinel node biopsy to follow. Dr. Dewey recommends external radiotherapy to the breast following surgery to reduce risks of local recurrence.  We discussed the risks, benefits, short, and long term effects of radiotherapy, as well as  the curative intent, and the patient is interested in proceeding. Dr. Dewey discusses the delivery and logistics of radiotherapy and anticipates a course of 4 or up to 6 1/2 weeks of radiotherapy to the right breast. We will see her back a few weeks after surgery to discuss the simulation process and anticipate we starting radiotherapy about 4-6 weeks after surgery.  2. Possible genetic predisposition to malignancy. The patient is a candidate for genetic testing given her personal  history. She will meet with our geneticist today in clinic.   In a visit lasting 60 minutes, greater than 50% of the time was spent face to face reviewing her case, as well as in preparation of, discussing, and coordinating the patient's care.  The above documentation reflects my direct findings during this shared patient visit. Please see the separate note by Dr. Dewey on this date for the remainder of the patient's plan of care.    Donald KYM Husband, [facility]    **Disclaimer: This note was dictated with voice recognition software. Similar sounding words can inadvertently be transcribed and this note may contain transcription errors which may not have been corrected upon publication of note.**

## 2024-04-16 NOTE — Telephone Encounter (Signed)
 Spoke to patient to confirm upcoming morning [facility] clinic appointment on 12/3, paperwork will be sent via [facility] location and time, also informed patient that the surgeon's office would be calling as well to get information from them similar to the packet that they will be receiving so make sure to do both.  Reminded patient that all providers will be coming to the clinic to see them HERE and if they had any questions to not hesitate to reach back out to myself or their navigators.

## 2024-04-18 ENCOUNTER — Encounter: Payer: Self-pay | Admitting: *Deleted

## 2024-04-18 ENCOUNTER — Other Ambulatory Visit: Payer: Self-pay | Admitting: *Deleted

## 2024-04-18 DIAGNOSIS — C50312 Malignant neoplasm of lower-inner quadrant of left female breast: Secondary | ICD-10-CM | POA: Insufficient documentation

## 2024-04-18 DIAGNOSIS — Z171 Estrogen receptor negative status [ER-]: Secondary | ICD-10-CM | POA: Insufficient documentation

## 2024-04-22 ENCOUNTER — Encounter: Payer: Self-pay | Admitting: *Deleted

## 2024-04-23 ENCOUNTER — Inpatient Hospital Stay: Attending: Hematology and Oncology | Admitting: Hematology and Oncology

## 2024-04-23 ENCOUNTER — Other Ambulatory Visit: Payer: Self-pay | Admitting: Radiology

## 2024-04-23 ENCOUNTER — Encounter: Payer: Self-pay | Admitting: *Deleted

## 2024-04-23 ENCOUNTER — Inpatient Hospital Stay

## 2024-04-23 ENCOUNTER — Ambulatory Visit
Admission: RE | Admit: 2024-04-23 | Discharge: 2024-04-23 | Disposition: A | Discharge status: Home / Self Care | Source: Ambulatory Visit | Attending: Radiation Oncology | Admitting: Radiation Oncology

## 2024-04-23 ENCOUNTER — Ambulatory Visit: Admitting: Physical Therapy

## 2024-04-23 ENCOUNTER — Inpatient Hospital Stay: Admitting: Licensed Clinical Social Worker

## 2024-04-23 ENCOUNTER — Other Ambulatory Visit: Payer: Self-pay

## 2024-04-23 VITALS — BP 115/68 | HR 87 | Temp 98.2°F | Resp 18 | Ht 60.0 in | Wt 133.9 lb

## 2024-04-23 DIAGNOSIS — C50311 Malignant neoplasm of lower-inner quadrant of right female breast: Secondary | ICD-10-CM

## 2024-04-23 DIAGNOSIS — D0511 Intraductal carcinoma in situ of right breast: Secondary | ICD-10-CM | POA: Diagnosis not present

## 2024-04-23 DIAGNOSIS — E119 Type 2 diabetes mellitus without complications: Secondary | ICD-10-CM | POA: Diagnosis not present

## 2024-04-23 DIAGNOSIS — Z809 Family history of malignant neoplasm, unspecified: Secondary | ICD-10-CM | POA: Insufficient documentation

## 2024-04-23 DIAGNOSIS — Z171 Estrogen receptor negative status [ER-]: Secondary | ICD-10-CM | POA: Diagnosis present

## 2024-04-23 DIAGNOSIS — M25511 Pain in right shoulder: Secondary | ICD-10-CM | POA: Insufficient documentation

## 2024-04-23 DIAGNOSIS — C50312 Malignant neoplasm of lower-inner quadrant of left female breast: Secondary | ICD-10-CM

## 2024-04-23 DIAGNOSIS — I10 Essential (primary) hypertension: Secondary | ICD-10-CM | POA: Diagnosis not present

## 2024-04-23 DIAGNOSIS — Z1379 Encounter for other screening for genetic and chromosomal anomalies: Secondary | ICD-10-CM | POA: Diagnosis not present

## 2024-04-23 DIAGNOSIS — R293 Abnormal posture: Secondary | ICD-10-CM | POA: Insufficient documentation

## 2024-04-23 DIAGNOSIS — F17210 Nicotine dependence, cigarettes, uncomplicated: Secondary | ICD-10-CM | POA: Insufficient documentation

## 2024-04-23 DIAGNOSIS — M25611 Stiffness of right shoulder, not elsewhere classified: Secondary | ICD-10-CM | POA: Diagnosis present

## 2024-04-23 DIAGNOSIS — R921 Mammographic calcification found on diagnostic imaging of breast: Secondary | ICD-10-CM | POA: Diagnosis not present

## 2024-04-23 LAB — CBC WITH DIFFERENTIAL (CANCER CENTER ONLY)
Abs Immature Granulocytes: 0.01 K/uL (ref 0.00–0.07)
Basophils Absolute: 0.1 K/uL (ref 0.0–0.1)
Basophils Relative: 1 %
Eosinophils Absolute: 0.4 K/uL (ref 0.0–0.5)
Eosinophils Relative: 5 %
HCT: 42 % (ref 36.0–46.0)
Hemoglobin: 13.9 g/dL (ref 12.0–15.0)
Immature Granulocytes: 0 %
Lymphocytes Relative: 45 %
Lymphs Abs: 3.9 K/uL (ref 0.7–4.0)
MCH: 27.6 pg (ref 26.0–34.0)
MCHC: 33.1 g/dL (ref 30.0–36.0)
MCV: 83.3 fL (ref 80.0–100.0)
Monocytes Absolute: 0.4 K/uL (ref 0.1–1.0)
Monocytes Relative: 5 %
Neutro Abs: 3.7 K/uL (ref 1.7–7.7)
Neutrophils Relative %: 44 %
Platelet Count: 451 K/uL — ABNORMAL HIGH (ref 150–400)
RBC: 5.04 MIL/uL (ref 3.87–5.11)
RDW: 14.4 % (ref 11.5–15.5)
WBC Count: 8.5 K/uL (ref 4.0–10.5)
nRBC: 0 % (ref 0.0–0.2)

## 2024-04-23 LAB — CMP (CANCER CENTER ONLY)
ALT: 18 U/L (ref 0–44)
AST: 20 U/L (ref 15–41)
Albumin: 5 g/dL (ref 3.5–5.0)
Alkaline Phosphatase: 95 U/L (ref 38–126)
Anion gap: 13 (ref 5–15)
BUN: 14 mg/dL (ref 8–23)
CO2: 29 mmol/L (ref 22–32)
Calcium: 10.4 mg/dL — ABNORMAL HIGH (ref 8.9–10.3)
Chloride: 98 mmol/L (ref 98–111)
Creatinine: 0.8 mg/dL (ref 0.44–1.00)
GFR, Estimated: >60 mL/min (ref >60)
Glucose, Bld: 139 mg/dL — ABNORMAL HIGH (ref 70–99)
Potassium: 3.5 mmol/L (ref 3.5–5.1)
Sodium: 139 mmol/L (ref 135–145)
Total Bilirubin: 0.3 mg/dL (ref 0.0–1.2)
Total Protein: 8.3 g/dL — ABNORMAL HIGH (ref 6.5–8.1)

## 2024-04-23 LAB — GENETIC SCREENING ORDER

## 2024-04-23 NOTE — Progress Notes (Signed)
 [facility] Clinical Social Work  Initial Assessment   Hannah [name] is a 63 y.o. year old female accompanied by husband and sister/minister. Clinical Social Work was referred by [facility] for assessment of psychosocial needs.   SDOH (Social Determinants of Health) assessments performed: Yes SDOH Interventions    Flowsheet Row Clinical Support from 04/23/2024 in [facility] - A Dept Of [place]  SDOH Interventions   Food Insecurity Interventions Other (Comment)  [information on food pantries]  Housing Interventions Intervention Not Indicated  Transportation Interventions Intervention Not Indicated  Utilities Interventions Intervention Not Indicated  Depression Interventions/Treatment  Counseling    SDOH Screenings   Food Insecurity: Food Insecurity Present (04/23/2024)  Housing: Low Risk  (04/23/2024)  Transportation Needs: No Transportation Needs (04/23/2024)  Utilities: Not At Risk (04/23/2024)  Depression (PHQ2-9): High Risk (04/23/2024)  Tobacco Use: High Risk (04/23/2024)    PHQ 2/9:    04/23/2024   11:47 AM  Depression screen PHQ 2/9  Decreased Interest 3  Down, Depressed, Hopeless 3  PHQ - 2 Score 6  Altered sleeping 3  Tired, decreased energy 3  Change in appetite 1  Feeling bad or failure about yourself  2  Trouble concentrating 2  Moving slowly or fidgety/restless 0  Suicidal thoughts 0  PHQ-9 Score 17  Difficult doing work/chores Not difficult at all     Distress Screen completed: No     No data to display            Family/Social Information:  Housing Arrangement: patient lives with her husband and a 40yo that she is the caregiver for Family members/support persons in your life? 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 He attends day program during the day.  Income source: Social Security Disability for herself & her husband. The young man she cares for receives  SSI Financial concerns: Yes, due to illness and/or loss of work during treatment Type of concern: Medical bills and Food Food access concerns: yes, sometimes food is tight. No SNAP for pt/spouse (only for client) Religious or spiritual practice: Yes-  Advanced directives: No Services Currently in place:  Insurance through marketplace (plan changing and price increasing in January). SSDI  Coping/ Adjustment to diagnosis: Patient understands treatment plan and what happens next? She is awaiting another biopsy to determine final plan (dependent on if it is DCIS or IDC) Concerns about diagnosis and/or treatment: Feelings of anger or sadness, Overwhelmed by information, Afraid of cancer, and How I will pay for the services I need Patient reported stressors: Insurance, Actuary, Food, Depression, and Adjusting to my illness Current coping skills/ strengths: Manufacturing systems engineer , Motivation for treatment/growth , Religious Affiliation , and Supportive family/friends     SUMMARY: Current SDOH Barriers:  Financial constraints related to medical & insurance costs Mental health/ adjustment to cancer  Clinical Social Work Clinical Goal(s):  Explore community resource options for unmet needs related to:  Corporate Treasurer  Patient will work with SW to address concerns related to depression/ adjustment to cancer  Interventions: Discussed common feeling and emotions when being diagnosed with cancer, and the importance of support during treatment Informed patient of the support team roles and support services at [facility] Provided CSW contact information and encouraged patient to call with any questions or concerns Provided patient with information about LIEAP program and [employer] pantry  Inquiry sent to S. [place] regarding [facility] Medicaid   Follow  Up Plan: CSW will call pt tomorrow to schedule counseling appt. At counseling appt, will also provide packet of information on local resources and foundations  for financial assistance Patient verbalizes understanding of plan: Yes    Carmen Vallecillo E Florence Antonelli, LCSW Clinical Social Worker [facility]

## 2024-04-23 NOTE — Therapy (Signed)
 OUTPATIENT PHYSICAL THERAPY BREAST CANCER BASELINE EVALUATION   Patient Name: Hannah [name] MRN: [id number] DOB:1960/07/23, 63 y.o., female Today's Date: 04/23/2024  END OF SESSION:  PT End of Session - 04/23/24 1120     Visit Number 1    Number of Visits 2    Date for Recertification  06/18/24    PT Start Time 0954    PT Stop Time 1010   Also saw pt from 1100-1115 for a total of 31 min   PT Time Calculation (min) 16 min    Activity Tolerance Patient tolerated treatment well    Behavior During Therapy WFL for tasks assessed/performed          Past Medical History:  Diagnosis Date   Arthritis    feet   Diabetes mellitus    type 2   Dysrhythmia    Family history of adverse reaction to anesthesia    father: post-op nausea and vomiting   Heart murmur    Hypertension    Wears glasses    Wears partial dentures    partial upper   Past Surgical History:  Procedure Laterality Date   ABDOMINAL HYSTERECTOMY     in her 30s   BREAST SURGERY  2 different occasions, over 5 years ago   cyst removal BIL   CHOLECYSTECTOMY N/A 12/25/2016   Procedure: LYSIS OF ADHESIONS, LAPAROSCOPIC CHOLECYSTECTOMY WITH INTRAOPERATIVE CHOLANGIOGRAM;  Surgeon: Tanda Locus, MD;  Location: [facility];  Service: General;  Laterality: N/A;   COLON SURGERY  at least 6 yrs ago per patient   polyps and fissures    DIAGNOSTIC LAPAROSCOPY  over 20 years ago   exploratory lap   FOOT SURGERY  1985   left   INSERTION OF MESH N/A 11/13/2014   Procedure: INSERTION OF MESH;  Surgeon: Lynda Leos, MD;  Location: [facility];  Service: General;  Laterality: N/A;   MICROLARYNGOSCOPY N/A 05/19/2014   Procedure: MICROLARYNGOSCOPY WITH EXCISION OF VOCAL CORD LESION;  Surgeon: Lonni FORBES Angle, MD;  Location: [facility];  Service: ENT;  Laterality: N/A;   MICROLARYNGOSCOPY WITH CO2 LASER AND EXCISION OF VOCAL CORD LESION N/A 09/05/2019   Procedure: MICROLARYNGOSCOPY WITH EXCISION OF VOCAL CORD LESION;   Surgeon: Angle Lonni FORBES, MD;  Location: [facility];  Service: ENT;  Laterality: N/A;   rectal fissure     UMBILICAL HERNIA REPAIR N/A 11/13/2014   Procedure: LAPAROSCOPIC UMBILICAL HERNIA REPAIR WITH MESH;  Surgeon: Lynda Leos, MD;  Location: [facility];  Service: General;  Laterality: N/A;   Patient Active Problem List   Diagnosis Date Noted   Malignant neoplasm of lower-inner quadrant of right breast of female, estrogen receptor negative (HCC) 04/18/2024   Acute cholecystitis due to biliary calculus 12/25/2016   LOCALIZED SUPERFICIAL SWELLING MASS OR LUMP 03/26/2007   VAGINAL DISCHARGE 10/05/2006   ABDOMINAL PAIN, CHRONIC 06/08/2006   TOBACCO ABUSE 04/27/2006   HYPERTENSION 04/27/2006   HEMORRHOIDS 04/27/2006   FISTULA, ANAL 04/27/2006   PALPITATIONS 04/27/2006   BREAST CYST, HX OF 04/27/2006    REFERRING PROVIDER: Dr. Donnice Bury  REFERRING DIAG: Right breast cancer  THERAPY DIAG:  Malignant neoplasm of lower-inner quadrant of right breast of female, estrogen receptor negative (HCC)  Abnormal posture  Acute pain of right shoulder  Stiffness of right shoulder, not elsewhere classified  Rationale for Evaluation and Treatment: Rehabilitation  ONSET DATE: 03/27/2024  SUBJECTIVE:  SUBJECTIVE STATEMENT: Patient reports she is here today to be seen by her medical team for her newly diagnosed right breast cancer.   PERTINENT HISTORY:  Patient was diagnosed on 03/27/2024 with right DCIS with probable invasive disease. It measures 2.4 cm and is located in the lower inner quadrant. It is triple negative with a Ki67 of 5%. She had a recent right shoulder scope in 12/2023 and is still functionally limited due to that.   PATIENT GOALS:   reduce lymphedema risk and learn post op  HEP.   PAIN:  Are you having pain? Yes: NPRS scale: 4/10 Pain location: Right shoulder Pain description: achy and stiff Aggravating factors: actively moving shoulder and laying on right side Relieving factors: rest and stretching  PRECAUTIONS: Active CA   RED FLAGS: None   HAND DOMINANCE: right  WEIGHT BEARING RESTRICTIONS: No  FALLS:  Has patient fallen in last 6 months? No  LIVING ENVIRONMENT: Patient lives with: her husband and a 63 y.o. 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  COGNITION: Overall cognitive status: Within functional limits for tasks assessed    POSTURE:  Forward head and rounded shoulders posture  UPPER EXTREMITY AROM/PROM:  A/PROM RIGHT  - all painful and stiffdue to recent shoulder scope  Eval 04/23/2024   Shoulder extension 30  Shoulder flexion 125  Shoulder abduction 113  Shoulder internal rotation 24  Shoulder external rotation 65    (Blank rows = not tested)  A/PROM LEFT   eval  Shoulder extension 44  Shoulder flexion 145  Shoulder abduction 153  Shoulder internal rotation 60  Shoulder external rotation 85    (Blank rows = not tested)  CERVICAL AROM: All within normal limits  UPPER EXTREMITY STRENGTH: Not tested due to recent shoulder surgery and pain  LYMPHEDEMA ASSESSMENTS (in cm):   LANDMARK RIGHT   eval  10 cm proximal to olecranon process from proximal aspect of olecranon 26.9  Olecranon process 22.8  10 cm proximal to ulnar styloid process from proximal aspect of styloid process 20.9  Just distal to ulnar styloid process 14.7  Across hand at thumb web space 17.5  At base of 2nd digit 5.7  (Blank rows = not  tested)  LANDMARK LEFT   eval  10 cm proximal to olecranon process from proximal aspect of olecranon 25.9   Olecranon process 21.8  10 cm proximal to ulnar styloid process from proximal aspect of styloid process 20.4  Just distal to ulnar styloid process 13.9  Across hand at thumb web space 16.9  At base of 2nd digit 5.3  (Blank rows = not tested)  L-DEX LYMPHEDEMA SCREENING:  The patient was assessed using the L-Dex machine today to produce a lymphedema index baseline score. The patient will be reassessed on a regular basis (typically every 3 months) to obtain new L-Dex scores. If the score is > 6.5 points away from his/her baseline score indicating onset of subclinical lymphedema, it will be recommended to wear a compression garment for 4 weeks, 12 hours per day and then be reassessed. If the score continues to be > 6.5 points from baseline at reassessment, we will initiate lymphedema treatment. Assessing in this manner has a 95%  rate of preventing clinically significant lymphedema.   L-DEX FLOWSHEETS - 04/23/24 1100       L-DEX LYMPHEDEMA SCREENING   Measurement Type Unilateral    L-DEX MEASUREMENT EXTREMITY Upper Extremity    POSITION  Standing    DOMINANT SIDE Right    At Risk Side Right    BASELINE SCORE (UNILATERAL) -0.2          QUICK DASH SURVEY:  Junie Palin - 04/23/24 0001     Open a tight or new jar Moderate difficulty    Do heavy household chores (wash walls, wash floors) Moderate difficulty    Carry a shopping bag or briefcase Moderate difficulty    Wash your back Severe difficulty    Use a knife to cut food Mild difficulty    Recreational activities in which you take some force or impact through your arm, shoulder, or hand (golf, hammering, tennis) Moderate difficulty    During the past week, to what extent has your arm, shoulder or hand problem interfered with your normal social activities with family, friends, neighbors, or groups? Modererately    During the  past week, to what extent has your arm, shoulder or hand problem limited your work or other regular daily activities Slightly    Arm, shoulder, or hand pain. Mild    Tingling (pins and needles) in your arm, shoulder, or hand None    Difficulty Sleeping Mild difficulty    DASH Score 38.64 %           PATIENT EDUCATION:  Education details: Time spent educating patient on aspects of self-care to maximize post op recovery. Patient was educated on where and how to get a post op compression bra to use to reduce post op edema. Patient was also educated on the use of SOZO screenings and surveillance principles for early identification of lymphedema onset. She was instructed to use the post op pillow in the axilla for pressure and pain relief. Patient educated on lymphedema risk reduction and post op shoulder/posture HEP. Person educated: Patient Education method: Explanation, Demonstration, Handout Education comprehension: Patient verbalized understanding and returned demonstration  HOME EXERCISE PROGRAM: Patient was instructed today in a home exercise program today for post op shoulder range of motion. These included active assist shoulder flexion in sitting, scapular retraction, wall walking with shoulder abduction, and hands behind head external rotation.  She was encouraged to do these twice a day, holding 3 seconds and repeating 5 times when permitted by her physician.   ASSESSMENT:  CLINICAL IMPRESSION: Patient was diagnosed on 03/27/2024 with right DCIS with probable invasive disease. It measures 2.4 cm and is located in the lower inner quadrant. It is triple negative with a Ki67 of 5%. She had a recent right shoulder scope in 12/2023 and is still functionally limited due to that. Her multidisciplinary medical team met prior to her assessments to determine a recommended treatment plan. She is planning to have possible neoadjuvant chemotherapy followed by a right lumpectomy and sentinel node  biopsy followed by radiation. She will benefit from a post op PT reassessment to determine needs and from L-Dex screens every 3 months for 2 years to detect subclinical lymphedema.  Pt will benefit from skilled therapeutic intervention to improve on the following deficits: Decreased knowledge of precautions, impaired UE functional use, pain, decreased ROM, postural dysfunction.   PT treatment/interventions: ADL/self-care home management, pt/family education, therapeutic exercise  REHAB POTENTIAL: Excellent  CLINICAL DECISION MAKING: Stable/uncomplicated  EVALUATION COMPLEXITY: Low   GOALS:  Goals reviewed with patient? YES  LONG TERM GOALS: (STG=LTG)    Name Target Date Goal status  1 Pt will be able to verbalize understanding of pertinent lymphedema risk reduction practices relevant to her dx specifically related to skin care.  Baseline:  No knowledge 04/23/2024 Achieved at eval  2 Pt will be able to return demo and/or verbalize understanding of the post op HEP related to regaining shoulder ROM. Baseline:  No knowledge 04/23/2024 Achieved at eval  3 Pt will be able to verbalize understanding of the importance of viewing the post op After Breast CA Class video for further lymphedema risk reduction education and therapeutic exercise.  Baseline:  No knowledge 04/23/2024 Achieved at eval  4 Pt will demo she has regained full shoulder ROM and function post operatively compared to baselines.  Baseline: See objective measurements taken today. 06/18/2024     PLAN:  PT FREQUENCY/DURATION: EVAL and 1 follow up appointment.   PLAN FOR NEXT SESSION: will reassess 3-4 weeks post op to determine needs.   Patient will follow up at outpatient cancer rehab 3-4 weeks following surgery.  If the patient requires physical therapy at that time, a specific plan will be dictated and sent to the referring physician for approval. The patient was educated today on appropriate basic range of motion exercises to  begin post operatively and the importance of viewing the After Breast Cancer class video following surgery.  Patient was educated today on lymphedema risk reduction practices as it pertains to recommendations that will benefit the patient immediately following surgery.  She verbalized good understanding.    Physical Therapy Information for After Breast Cancer Surgery/Treatment:  Lymphedema is a swelling condition that you may be at risk for in your arm if you have lymph nodes removed from the armpit area.  After a sentinel node biopsy, the risk is approximately 5-9% and is higher after an axillary node dissection.  There is treatment available for this condition and it is not life-threatening.  Contact your physician or physical therapist with concerns. You may begin the 4 shoulder/posture exercises (see additional sheet) when permitted by your physician (typically a week after surgery).  If you have drains, you may need to wait until those are removed before beginning range of motion exercises.  A general recommendation is to not lift your arms above shoulder height until drains are removed.  These exercises should be done to your tolerance and gently.  This is not a no pain/no gain type of recovery so listen to your body and stretch into the range of motion that you can tolerate, stopping if you have pain.  If you are having immediate reconstruction, ask your plastic surgeon about doing exercises as he or she may want you to wait. We encourage you to view the After Breast Cancer class video following surgery.  You will learn information related to lymphedema risk, prevention and treatment and additional exercises to regain mobility following surgery.   While undergoing any medical procedure or treatment, try to avoid blood pressure being taken or needle sticks from occurring on the arm on the side of cancer.   This recommendation begins after surgery and continues for the rest of your life.  This may help  reduce your risk of getting lymphedema (swelling in your arm). An excellent resource for those seeking information on lymphedema is the National Lymphedema Network's web site. It can be accessed at www.lymphnet.org If you notice swelling in your hand, arm or breast at any time  following surgery (even if it is many years from now), please contact your doctor or physical therapist to discuss this.  Lymphedema can be treated at any time but it is easier for you if it is treated early on.  If you feel like your shoulder motion is not returning to normal in a reasonable amount of time, please contact your surgeon or physical therapist.  [facility] [phone]. [address], [address], [place] [address]  [facility] CLASS After Breast Cancer Class  After Breast Cancer Class is a specially designed exercise class video to assist you in a safe recover after having breast cancer surgery.  In this video you will learn how to get back to full function whether your drains were just removed or if you had surgery a month ago. The video can be viewed on this page: [email or web address].boyd-meyer.org/ or on YouTube here: https://youtu.[email or web address].  Class Goals  Understand specific stretches to improve the flexibility of you chest and shoulder. Learn ways to safely strengthen your upper body and improve your posture. Understand the warning signs of infection and why you may be at risk for an arm infection. Learn about Lymphedema and prevention.  ** You do not need to view this video until after surgery.  Drains should be removed to participate in the recommended exercises on the video.  Patient was instructed today in a home exercise program today for post op shoulder range of motion. These included active assist shoulder flexion in sitting, scapular retraction, wall walking with shoulder abduction, and hands behind head external  rotation.  She was encouraged to do these twice a day, holding 3 seconds and repeating 5 times when permitted by her physician.  Eward Wonda Sharps, [place] 04/23/24 11:51 AM

## 2024-04-23 NOTE — Progress Notes (Signed)
 [facility] CONSULT NOTE  Patient Care Team: Benjamine Aland, MD as PCP - General (Family Medicine) Tyree Nanetta SAILOR, RN as Oncology Nurse Navigator Ebbie Cough, MD as Consulting Physician (General Surgery) Loretha Ash, MD as Consulting Physician (Hematology and Oncology) Dewey Rush, MD as Consulting Physician (Radiation Oncology)  CHIEF COMPLAINTS/PURPOSE OF CONSULTATION:  Newly diagnosed breast cancer  HISTORY OF PRESENTING ILLNESS:  Hannah [name] 63 y.o. female is here because of recent diagnosis of right breast cancer  I reviewed her records extensively and collaborated the history with the patient.  SUMMARY OF ONCOLOGIC HISTORY: Oncology History   No history exists.   This is a very pleasant 64 yr old female patient with PMH significant for DM, HTN referred to breast [facility] for new diagnosis of right sided breast cancer. She first felt this mass at least 3 months ago. She didn't think it has grown much but felt that its very sensitive and since she had a scheduled mammogram, she didn't pursue this sooner. She is very surprised that this ended up becoming invasive triple negative breast cancer. She had multiple benign biopsies in both breasts and thought this will be another benign finding. She has type II DM for 10 yrs and didn't have any complications related to DM She otherwise is very healthy.  No family history of breast cancer  Rest of the pertinent 10 point ROS reviewed and neg.  MEDICAL HISTORY:  Past Medical History:  Diagnosis Date   Arthritis    feet   Diabetes mellitus    type 2   Dysrhythmia    Family history of adverse reaction to anesthesia    father: post-op nausea and vomiting   Heart murmur    Hypertension    Wears glasses    Wears partial dentures    partial upper    SURGICAL HISTORY: Past Surgical History:  Procedure Laterality Date   ABDOMINAL HYSTERECTOMY     in her 30s   BREAST SURGERY  2 different occasions, over  5 years ago   cyst removal BIL   CHOLECYSTECTOMY N/A 12/25/2016   Procedure: LYSIS OF ADHESIONS, LAPAROSCOPIC CHOLECYSTECTOMY WITH INTRAOPERATIVE CHOLANGIOGRAM;  Surgeon: Tanda Locus, MD;  Location: [facility];  Service: General;  Laterality: N/A;   COLON SURGERY  at least 6 yrs ago per patient   polyps and fissures    DIAGNOSTIC LAPAROSCOPY  over 20 years ago   exploratory lap   FOOT SURGERY  1985   left   INSERTION OF MESH N/A 11/13/2014   Procedure: INSERTION OF MESH;  Surgeon: Lynda Leos, MD;  Location: [facility];  Service: General;  Laterality: N/A;   MICROLARYNGOSCOPY N/A 05/19/2014   Procedure: MICROLARYNGOSCOPY WITH EXCISION OF VOCAL CORD LESION;  Surgeon: Lonni FORBES Angle, MD;  Location: [facility];  Service: ENT;  Laterality: N/A;   MICROLARYNGOSCOPY WITH CO2 LASER AND EXCISION OF VOCAL CORD LESION N/A 09/05/2019   Procedure: MICROLARYNGOSCOPY WITH EXCISION OF VOCAL CORD LESION;  Surgeon: Angle Lonni FORBES, MD;  Location: [facility];  Service: ENT;  Laterality: N/A;   rectal fissure     UMBILICAL HERNIA REPAIR N/A 11/13/2014   Procedure: LAPAROSCOPIC UMBILICAL HERNIA REPAIR WITH MESH;  Surgeon: Lynda Leos, MD;  Location: [facility];  Service: General;  Laterality: N/A;    SOCIAL HISTORY: Social History   Socioeconomic History   Marital status: Married    Spouse name: Not on file   Number of children: Not on file  Years of education: Not on file   Highest education level: Not on file  Occupational History   Not on file  Tobacco Use   Smoking status: Every Day    Current packs/day: 0.75    Average packs/day: 0.8 packs/day for 54.9 years (41.2 ttl pk-yrs)    Types: Cigarettes    Start date: 100   Smokeless tobacco: Never  Vaping Use   Vaping status: Never Used  Substance and Sexual Activity   Alcohol use: No   Drug use: No   Sexual activity: Yes  Other Topics Concern   Not on file  Social History Narrative   Not on file    Social Drivers of Health   Financial Resource Strain: Not on file  Food Insecurity: Food Insecurity Present (04/23/2024)   Hunger Vital Sign    Worried About Running Out of Food in the Last Year: Sometimes true    Ran Out of Food in the Last Year: Sometimes true  Transportation Needs: No Transportation Needs (04/23/2024)   PRAPARE - Administrator, Civil Service (Medical): No    Lack of Transportation (Non-Medical): No  Physical Activity: Not on file  Stress: Not on file  Social Connections: Not on file  Intimate Partner Violence: Not At Risk (04/23/2024)   Humiliation, Afraid, Rape, and Kick questionnaire    Fear of Current or Ex-Partner: No    Emotionally Abused: No    Physically Abused: No    Sexually Abused: No    FAMILY HISTORY: No family history on file.  ALLERGIES:  is allergic to hydrochlorothiazide and latex.  MEDICATIONS:  Current Outpatient Medications  Medication Sig Dispense Refill   acetaminophen  (TYLENOL ) 325 MG tablet Take 2 tablets (650 mg total) by mouth every 6 (six) hours as needed. (Patient taking differently: Take 650 mg by mouth every 6 (six) hours as needed for mild pain. )     amLODipine  (NORVASC ) 5 MG tablet Take 5 mg by mouth daily.     chlorthalidone (HYGROTON) 25 MG tablet Take 25 mg by mouth daily.      empagliflozin (JARDIANCE) 10 MG TABS tablet Take 10 mg by mouth daily.     lisinopril  (PRINIVIL ,ZESTRIL ) 40 MG tablet Take 40 mg by mouth daily.     metFORMIN  (GLUCOPHAGE ) 500 MG tablet Take 1,000 mg by mouth 2 (two) times daily with a meal.     omeprazole (PRILOSEC) 20 MG capsule Take 20 mg by mouth daily.     No current facility-administered medications for this visit.    PHYSICAL EXAMINATION: ECOG PERFORMANCE STATUS: 0 - Asymptomatic  Vitals:   04/23/24 0920  BP: 115/68  Pulse: 87  Resp: 18  Temp: 98.2 F (36.8 C)  SpO2: 100%   Filed Weights   04/23/24 0920  Weight: 133 lb 14.4 oz (60.7 kg)    GENERAL:alert, no  distress and comfortable Palpable right breast mass in the lower inner quadrant measuring about 2 cm. No other breast mass. Both breasts are tender to palpation overall No regional adenopathy CTA bilaterally RRR No LE edema.  LABORATORY DATA:  I have reviewed the data as listed Lab Results  Component Value Date   WBC 6.5 11/18/2022   HGB 13.0 11/18/2022   HCT 38.9 11/18/2022   MCV 83.1 11/18/2022   PLT 341 11/18/2022   Lab Results  Component Value Date   NA 134 (L) 11/18/2022   K 3.2 (L) 11/18/2022   CL 98 11/18/2022   CO2 26 11/18/2022  RADIOGRAPHIC STUDIES: I have personally reviewed the radiological reports and agreed with the findings in the report.  ASSESSMENT AND PLAN:  Malignant neoplasm of lower-inner quadrant of right breast of female, estrogen receptor negative (HCC) This is a very pleasant 63 yr old pt with PMH of type II DM, HTN and previous benign breast biopsies now diagnosed with right breast mass, biopsy showed DCIS, invasion not rule out, prognostics showed ER neg PR neg and Her 2 neg, grade not mentioned, ki 67 5% referred to breast [facility] for recommendations. We discussed her pathology details and imaging in the breast [facility] with the multidisciplinary team. We have recommended repeating biopsy, scheduled for today. If this repeat biopsy showed triple neg IDC, we would recommend neoadj chemotherapy with immunotherapy. With regards to neoadj regimen, we talked about KEYNOTE 522 regimen. If there is concern about tolerance alternate regimen that we can consider is NEOPACT. She is agreeable to port placement, ECHO and chemo class If neoadjuvant chemotherapy is indeed recommended. We have discussed the curative intent of the treatment in this setting. I would recommend we schedule a telephone visit next week to review path from today and to finalize the plan Anticipate initiation of chemotherapy in the next 2 weeks.   All questions were answered. The patient knows  to call the clinic with any problems, questions or concerns.    Amber Stalls, MD 04/23/24

## 2024-04-23 NOTE — Assessment & Plan Note (Signed)
 This is a very pleasant 63 yr old pt with PMH of type II DM, HTN and previous benign breast biopsies now diagnosed with right breast mass, biopsy showed DCIS, invasion not rule out, prognostics showed ER neg PR neg and Her 2 neg, grade not mentioned, ki 67 5% referred to breast MDC for recommendations. We discussed her pathology details and imaging in the breast MDC with the multidisciplinary team. We have recommended repeating biopsy, scheduled for today. If this repeat biopsy showed triple neg IDC, we would recommend neoadj chemotherapy with immunotherapy. With regards to neoadj regimen, we talked about KEYNOTE 522 regimen. If there is concern about tolerance alternate regimen that we can consider is NEOPACT. She is agreeable to port placement, ECHO and chemo class If neoadjuvant chemotherapy is indeed recommended. We have discussed the curative intent of the treatment in this setting. I would recommend we schedule a telephone visit next week to review path from today and to finalize the plan Anticipate initiation of chemotherapy in the next 2 weeks.

## 2024-04-24 ENCOUNTER — Encounter: Payer: Self-pay | Admitting: Radiology

## 2024-04-24 LAB — SURGICAL PATHOLOGY

## 2024-04-24 NOTE — Progress Notes (Signed)
 REFERRING PROVIDER: Ebbie Cough, MD [address] [place],  [place] [address]  PRIMARY PROVIDER:  Benjamine Aland, MD  PRIMARY REASON FOR VISIT:  1. Malignant neoplasm of lower-inner quadrant of right breast of female, estrogen receptor negative (HCC)    HISTORY OF PRESENT ILLNESS:   Hannah [name], a 63 y.o. female, was seen on 04/23/2024 for a [facility] cancer genetics consultation in the [facility] at the request of Dr. Ebbie due to a personal history of triple negative breast cancer.  Hannah [name] presents to clinic today to discuss the possibility of a hereditary predisposition to cancer, genetic testing, and to further clarify her future cancer risks, as well as potential cancer risks for family members.   CANCER HISTORY:  In 2025, at the age of 4, Hannah [name] was diagnosed with right breast cancer. Biopsy showed DCIS, invasion not rule out, prognostics showed ER neg PR neg and Her 2 neg (triple negative). A repeat biopsy was scheduled for today to assess if the cancer is invasive or not. If invasive, the recommendation is for neoadj chemotherapy with immunotherapy. Please see more details regarding treatment in Dr. Walter note.  RELEVANT MEDICAL HISTORY AND RISK FACTORS:  Menarche was at age 76.  First live birth at age 16.  OCP use for approximately 16 years.  Ovaries intact: has one ovary intact.  Hysterectomy: yes.  Menopausal status: postmenopausal.  Up to date with pelvic exams: yes, patient reports most recent in 01/28/2024 Colonoscopy: yes Exposures: yes. Current smoker. Has gotten hair perms in the past.  Past Medical History:  Diagnosis Date   Arthritis    feet   Diabetes mellitus    type 2   Dysrhythmia    Family history of adverse reaction to anesthesia    father: post-op nausea and vomiting   Heart murmur    Hypertension    Wears glasses    Wears partial dentures    partial upper    Past Surgical History:   Procedure Laterality Date   ABDOMINAL HYSTERECTOMY     in her 30s   BREAST SURGERY  2 different occasions, over 5 years ago   cyst removal BIL   CHOLECYSTECTOMY N/A 12/25/2016   Procedure: LYSIS OF ADHESIONS, LAPAROSCOPIC CHOLECYSTECTOMY WITH INTRAOPERATIVE CHOLANGIOGRAM;  Surgeon: Tanda Locus, MD;  Location: [facility];  Service: General;  Laterality: N/A;   COLON SURGERY  at least 6 yrs ago per patient   polyps and fissures    DIAGNOSTIC LAPAROSCOPY  over 20 years ago   exploratory lap   FOOT SURGERY  1985   left   INSERTION OF MESH N/A 11/13/2014   Procedure: INSERTION OF MESH;  Surgeon: Lynda Leos, MD;  Location: [facility];  Service: General;  Laterality: N/A;   MICROLARYNGOSCOPY N/A 05/19/2014   Procedure: MICROLARYNGOSCOPY WITH EXCISION OF VOCAL CORD LESION;  Surgeon: Lonni FORBES Angle, MD;  Location: [facility];  Service: ENT;  Laterality: N/A;   MICROLARYNGOSCOPY WITH CO2 LASER AND EXCISION OF VOCAL CORD LESION N/A 09/05/2019   Procedure: MICROLARYNGOSCOPY WITH EXCISION OF VOCAL CORD LESION;  Surgeon: Angle Lonni FORBES, MD;  Location: [facility];  Service: ENT;  Laterality: N/A;   rectal fissure     UMBILICAL HERNIA REPAIR N/A 11/13/2014   Procedure: LAPAROSCOPIC UMBILICAL HERNIA REPAIR WITH MESH;  Surgeon: Lynda Leos, MD;  Location: [facility];  Service: General;  Laterality: N/A;    Social History   Socioeconomic History   Marital status:  Married    Spouse name: Not on file   Number of children: Not on file   Years of education: Not on file   Highest education level: Not on file  Occupational History   Not on file  Tobacco Use   Smoking status: Every Day    Current packs/day: 0.75    Average packs/day: 0.8 packs/day for 54.9 years (41.2 ttl pk-yrs)    Types: Cigarettes    Start date: 20   Smokeless tobacco: Never  Vaping Use   Vaping status: Never Used  Substance and Sexual Activity   Alcohol use: No   Drug use: No   Sexual  activity: Yes  Other Topics Concern   Not on file  Social History Narrative   Not on file   Social Drivers of Health   Financial Resource Strain: Not on file  Food Insecurity: Food Insecurity Present (04/23/2024)   Hunger Vital Sign    Worried About Running Out of Food in the Last Year: Sometimes true    Ran Out of Food in the Last Year: Sometimes true  Transportation Needs: No Transportation Needs (04/23/2024)   PRAPARE - Administrator, Civil Service (Medical): No    Lack of Transportation (Non-Medical): No  Physical Activity: Not on file  Stress: Not on file  Social Connections: Not on file     FAMILY HISTORY:  We obtained a detailed, 4-generation family history.  Significant diagnoses are listed below: Family History  Problem Relation Age of Onset   Cancer Maternal Cousin        blood cancer      Hannah [name] reports her maternal cousin had blood cancer diagnosed at age 19. She does not report any family history of other cancers. She reported having limited information about the paternal side of the family.  Ms. Bang is unaware of previous family history of genetic testing for hereditary cancer risks. There is no reported Ashkenazi Jewish ancestry. There is no known consanguinity.  GENETIC COUNSELING ASSESSMENT:  Hannah [name] is a 63 y.o. female with a personal history of triple negative breast cancer which is somewhat suggestive of a hereditary cancer syndrome and predisposition to cancer given this history. We, therefore, discussed and recommended the following at today's visit.   DISCUSSION: We discussed that, in general, most cancer is not inherited in families, but instead is sporadic or familial. Sporadic cancers occur by chance and typically happen at older ages (>50 years) as this type of cancer is caused by genetic changes acquired during an individual's lifetime. Some families have more cancers than would be expected by chance; however, the ages or types  of cancer are not consistent with a known genetic mutation or known genetic mutations have been ruled out. This type of familial cancer is thought to be due to a combination of multiple genetic, environmental, hormonal, and lifestyle factors. While this combination of factors likely increases the risk of cancer, the exact source of this risk is not currently identifiable or testable.  We discussed that 5 - 10% of breast cancer is hereditary. Most cases of hereditary breast cancer are associated with BRCA1 and BRCA2 genes, although there are other genes associated with hereditary breast cancer as well. Cancer risks and management strategies are gene specific. We discussed that genetic testing can beneficial for several reasons, including clarifying specific cancer risks, identifying potential screening and risk-reduction options that may be appropriate, and to understand if other family members could be at risk for cancer  and allow them to undergo genetic testing to clarify their cancer risks.   We reviewed the characteristics, features and inheritance patterns of hereditary cancer syndromes. We also discussed genetic testing, including the appropriate family members to test, the process of testing, insurance coverage and turn-around-time for results. We discussed the implications of a negative, positive, carrier and/or variant of uncertain significant result.   Hannah [name]  was offered the [facility] BRCAPlus gene panel which includes sequencing and rearrangement analysis for the following 13 genes: ATM, BARD1, BRCA1, BRCA2, CDH1, CHEK2, NF1, PALB2, PTEN, RAD51C, RAD51D, STK11 and TP53 (sequencing and deletion/duplication).  Hannah [name] was offered the [facility] CancerNext + RNAinsight gene panel which includes sequencing, rearrangement analysis, and RNA analysis for the following 40 genes: APC, ATM, BAP1, BARD1, BMPR1A, BRCA1, BRCA2, BRIP1, CDH1, CDKN2A, CHEK2, FH, FLCN, MET, MLH1, MSH2, MSH6, MUTYH, NF1, NTHL1,  PALB2, PMS2, PTEN, RAD51C, RAD51D, RPS20, SMAD4, STK11, TP53, TSC1, TSC2, and VHL (sequencing and deletion/duplication); AXIN2, HOXB13, MBD4, MSH3, POLD1 and POLE (sequencing only); EPCAM and GREM1 (deletion/duplication only).   Hannah [name] was also offered the [facility] CancerNext-Expanded + RNAinsight gene panel which includes sequencing, rearrangement, and RNA analysis for the following 77 genes: AIP, ALK, APC, ATM, AXIN2, BAP1, BARD1, BMPR1A, BRCA1, BRCA2, BRIP1, CDC73, CDH1, CDK4, CDKN1B, CDKN2A, CEBPA, CHEK2, CTNNA1, DDX41, DICER1, ETV6, FH, FLCN, GATA2, LZTR1, MAX, MBD4, MEN1, MET, MLH1, MSH2, MSH3, MSH6, MUTYH, NF1, NF2, NTHL1, PALB2, PHOX2B, PMS2, POT1, PRKAR1A, PTCH1, PTEN, RAD51C, RAD51D, RB1, RET, RPS20, RUNX1, SDHA, SDHAF2, SDHB, SDHC, SDHD, SMAD4, SMARCA4, SMARCB1, SMARCE1, STK11, SUFU, TMEM127, TP53, TSC1, TSC2, VHL, and WT1 (sequencing and deletion/duplication); EGFR, HOXB13, KIT, MITF, PDGFRA, POLD1, and POLE (sequencing only); EPCAM and GREM1 (deletion/duplication only).    Hannah [name] was informed of the benefits and limitations of each panel, including that expanded pan-cancer panels contain genes may not have clear management guidelines at this point in time. We also discussed that as the number of genes included on a panel increases, the chances of variants of uncertain significance increases. After considering the risks, benefits, and limitations, Hannah [name] provided informed consent to pursue genetic testing. In order to get genetic test results in a timely manner so that Hannah [name] can use these genetic test results for surgical decisions, we recommended Hannah [name] pursue genetic testing for the [facility] BRCAPlus 13 gene panel, which is a stat panel. This test will run concurrently with the [facility] CancerNext 40 gene panel.   Hannah [name] asked if she could do the 40 gene panel for now and then consider doing the 17 gene panel. I said she could absolutely do that and that it's unlikely we  would  need another sample.  Based on Hannah [name]'s personal history of cancer, she meets medical criteria for genetic testing. she meets criteria due to having triple negative breast cancer. Despite that she meets criteria, she may still have an out of pocket cost. We discussed that if her out of pocket cost for testing is over $100, the laboratory will call and confirm whether she wants to proceed with testing.  If the out of pocket cost of testing is less than $100 she will be billed by the genetic testing laboratory.   We discussed that some people do not want to undergo genetic testing due to fear of genetic discrimination.  The Genetic Information Nondiscrimination Act (GINA) was signed into federal law in 2008. GINA prohibits health insurers and most employers from discriminating against individuals based on genetic information (including the results  of genetic tests and family history information). According to GINA, health insurance companies cannot consider genetic information to be a preexisting condition, nor can they use it to make decisions regarding coverage or rates. GINA also makes it illegal for most employers to use genetic information in making decisions about hiring, firing, promotion, or terms of employment. It is important to note that GINA does not offer protections for life insurance, disability insurance, or long-term care insurance. GINA does not apply to those in the [employer], those who work for companies with less than 15 employees, and new life insurance or long-term disability insurance policies.  Health status due to a cancer diagnosis is not protected under GINA. More information about GINA can be found by visiting [email or web address].[email or web address].  PLAN:  After considering the risks, benefits, and limitations, Hannah [name] provided informed consent to pursue genetic testing and the blood sample was sent to [facility] for analysis of the [facility] BRCAPlus 13 gene panel, results of  which should be available within approximately 7-10 days time. This test will run concurrently with the [facility] CancerNext 40 gene panel, results of which should be available within 2-3 weeks. Results will be disclosed by telephone to  Hannah [name], as will any additional recommendations warranted by these results. Hannah [name] will receive a summary of her genetic counseling visit and a copy of her results once available. This information will also be available in Epic.   RESOURCES PROVIDED:  Hannah [name] was provided with the following:  [facility] Billing information  [facility] Contact card   Lastly, we encouraged Hannah [name] to remain in contact with cancer genetics annually so that we can continuously update the family history and inform her of any changes in cancer genetics and testing that may be of benefit for this family.   Hannah [name] questions were answered to her satisfaction today. Our contact information was provided should additional questions or concerns arise. Thank you for the referral and allowing us  to share in the care of your patient.   Carsin Randazzo R. Bluford, MS, [facility] Certified [email or web address] phone: [phone]  I personally spent a total of 20 minutes in the care of the patient today including preparing to see the patient, getting/reviewing separately obtained history, counseling and educating, placing orders, referring and communicating with other health care professionals, and documenting clinical information in the EHR. The patient brought her sister and her husband. Drs. Lanny Stalls, and/or Gudena were available for questions, if needed.   _______________________________________________________________________ For Office Staff:  Number of people involved in session: 3 Was an Intern/ student involved with case: no

## 2024-04-25 ENCOUNTER — Telehealth: Payer: Self-pay

## 2024-04-25 ENCOUNTER — Encounter: Payer: Self-pay | Admitting: *Deleted

## 2024-04-25 ENCOUNTER — Telehealth: Payer: Self-pay | Admitting: *Deleted

## 2024-04-25 NOTE — Progress Notes (Signed)
 Surgical Instructions   Your procedure is scheduled on Thursday, December 11th, 2025. Report to [facility]trance A at 1:15 P.M., then check in with the Admitting office. Any questions or running late day of surgery: call [phone]  Questions prior to your surgery date: call [phone], Monday-Friday, 8am-4pm. If you experience any cold or flu symptoms such as cough, fever, chills, shortness of breath, etc. between now and your scheduled surgery, please notify us  at the above number.     Remember:  Do not eat after midnight the night before your surgery   You may drink clear liquids until 12:15 P.M. the day of your surgery.   Clear liquids allowed are: Water, Non-Citrus Juices (without pulp), Carbonated Beverages, Clear Tea (no milk, honey, etc.), Black Coffee Only (NO MILK, CREAM OR POWDERED CREAMER of any kind), and Gatorade.    Take these medicines the morning of surgery with A SIP OF WATER: Amlodipine  (Norvasc )   May take these medicines IF NEEDED: Acetaminophen  (Tylenol )    One week prior to surgery, STOP taking any Aspirin (unless otherwise instructed by your surgeon) Aleve, Naproxen, Ibuprofen , Motrin , Advil , Goody's, BC's, all herbal medications, fish oil, and non-prescription vitamins.   WHAT DO I DO ABOUT MY DIABETES MEDICATION?   Do not take Metformin  (Glucophage ) the morning of surgery.     HOW TO MANAGE YOUR DIABETES BEFORE AND AFTER SURGERY  Why is it important to control my blood sugar before and after surgery? Improving blood sugar levels before and after surgery helps healing and can limit problems. A way of improving blood sugar control is eating a healthy diet by:  Eating less sugar and carbohydrates  Increasing activity/exercise  Talking with your doctor about reaching your blood sugar goals High blood sugars (greater than 180 mg/dL) can raise your risk of infections and slow your recovery, so you will need to focus on controlling your  diabetes during the weeks before surgery. Make sure that the doctor who takes care of your diabetes knows about your planned surgery including the date and location.  How do I manage my blood sugar before surgery? Check your blood sugar at least 4 times a day, starting 2 days before surgery, to make sure that the level is not too high or low.  Check your blood sugar the morning of your surgery when you wake up and every 2 hours until you get to the Short Stay unit.  If your blood sugar is less than 70 mg/dL, you will need to treat for low blood sugar: Do not take insulin . Treat a low blood sugar (less than 70 mg/dL) with  cup of clear juice (cranberry or apple), 4 glucose tablets, OR glucose gel. Recheck blood sugar in 15 minutes after treatment (to make sure it is greater than 70 mg/dL). If your blood sugar is not greater than 70 mg/dL on recheck, call [phone] for further instructions. Report your blood sugar to the short stay nurse when you get to Short Stay.  If you are admitted to the hospital after surgery: Your blood sugar will be checked by the staff and you will probably be given insulin  after surgery (instead of oral diabetes medicines) to make sure you have good blood sugar levels. The goal for blood sugar control after surgery is 80-180 mg/dL.                      Do NOT Smoke (Tobacco/Vaping) for 24 hours prior to your procedure.  If  you use a CPAP at night, you may bring your mask/headgear for your overnight stay.   You will be asked to remove any contacts, glasses, piercing's, hearing aid's, dentures/partials prior to surgery. Please bring cases for these items if needed.    Patients discharged the day of surgery will not be allowed to drive home, and someone needs to stay with them for 24 hours.  SURGICAL WAITING ROOM VISITATION Patients may have no more than 2 support people in the waiting area - these visitors may rotate.   Pre-op nurse will coordinate an  appropriate time for 1 ADULT support person, who may not rotate, to accompany patient in pre-op.  Children under the age of 64 must have an adult with them who is not the patient and must remain in the main waiting area with an adult.  If the patient needs to stay at the hospital during part of their recovery, the visitor guidelines for inpatient rooms apply.  Please refer to the [facility] website for the visitor guidelines for any additional information.   If you received a COVID test during your pre-op visit  it is requested that you wear a mask when out in public, stay away from anyone that may not be feeling well and notify your surgeon if you develop symptoms. If you have been in contact with anyone that has tested positive in the last 10 days please notify you surgeon.      Pre-operative CHG Bathing Instructions   You can play a key role in reducing the risk of infection after surgery. Your skin needs to be as free of germs as possible. You can reduce the number of germs on your skin by washing with CHG (chlorhexidine  gluconate) soap before surgery. CHG is an antiseptic soap that kills germs and continues to kill germs even after washing.   DO NOT use if you have an allergy to chlorhexidine /CHG or antibacterial soaps. If your skin becomes reddened or irritated, stop using the CHG and notify one of our RNs at [phone].              TAKE A SHOWER THE NIGHT BEFORE SURGERY   Please keep in mind the following:  DO NOT shave, including legs and underarms, 48 hours prior to surgery.   You may shave your face before/day of surgery.  Place clean sheets on your bed the night before surgery Use a clean washcloth (not used since being washed) for shower. DO NOT sleep with pet's night before surgery.  CHG Shower Instructions:  Wash your face and private area with normal soap. If you choose to wash your hair, wash first with your normal shampoo.  After you use shampoo/soap, rinse your  hair and body thoroughly to remove shampoo/soap residue.  Turn the water OFF and apply half the bottle of CHG soap to a CLEAN washcloth.  Apply CHG soap ONLY FROM YOUR NECK DOWN TO YOUR TOES (washing for 3-5 minutes)  DO NOT use CHG soap on face, private areas, open wounds, or sores.  Pay special attention to the area where your surgery is being performed.  If you are having back surgery, having someone wash your back for you may be helpful. Wait 2 minutes after CHG soap is applied, then you may rinse off the CHG soap.  Pat dry with a clean towel  Put on clean pajamas    Additional instructions for the day of surgery: If you choose, you may shower the morning of surgery with  an antibacterial soap.  DO NOT APPLY any lotions, deodorants, cologne, or perfumes.   Do not wear jewelry or makeup Do not wear nail polish, gel polish, artificial nails, or any other type of covering on natural nails (fingers and toes) Do not bring valuables to the hospital. [facility] is not responsible for valuables/personal belongings. Put on clean/comfortable clothes.  Please brush your teeth.  Ask your nurse before applying any prescription medications to the skin.

## 2024-04-25 NOTE — Telephone Encounter (Signed)
 Spoke with patient to follow up from [facility] and assess navigation needs.  Patient had questions regarding next steps after additional bx. Explained to her that surgery will now be her next step and once we have the final pathology will determine next steps after that.  Reviewed pathology from additional bx and patient verbalized understanding with plan.  Encouraged her to call with any additional questions or concerns she may have.

## 2024-04-25 NOTE — Telephone Encounter (Signed)
Attempted to contact patient regarding [facility] medicaid application. Left message on voicemail requesting a return call.

## 2024-04-28 ENCOUNTER — Telehealth: Payer: Self-pay | Admitting: Hematology and Oncology

## 2024-04-28 ENCOUNTER — Other Ambulatory Visit: Payer: Self-pay

## 2024-04-28 ENCOUNTER — Other Ambulatory Visit: Payer: Self-pay | Admitting: General Surgery

## 2024-04-28 ENCOUNTER — Encounter (HOSPITAL_COMMUNITY): Payer: Self-pay

## 2024-04-28 ENCOUNTER — Inpatient Hospital Stay (HOSPITAL_COMMUNITY)
Admission: RE | Admit: 2024-04-28 | Discharge: 2024-04-28 | Disposition: A | Source: Ambulatory Visit | Attending: General Surgery

## 2024-04-28 VITALS — BP 113/68 | HR 81 | Temp 98.1°F | Resp 18 | Ht 60.0 in | Wt 134.2 lb

## 2024-04-28 DIAGNOSIS — I10 Essential (primary) hypertension: Secondary | ICD-10-CM | POA: Diagnosis not present

## 2024-04-28 DIAGNOSIS — E119 Type 2 diabetes mellitus without complications: Secondary | ICD-10-CM | POA: Diagnosis not present

## 2024-04-28 DIAGNOSIS — D0511 Intraductal carcinoma in situ of right breast: Secondary | ICD-10-CM | POA: Diagnosis not present

## 2024-04-28 DIAGNOSIS — Z171 Estrogen receptor negative status [ER-]: Secondary | ICD-10-CM

## 2024-04-28 DIAGNOSIS — Z6826 Body mass index (BMI) 26.0-26.9, adult: Secondary | ICD-10-CM | POA: Diagnosis not present

## 2024-04-28 DIAGNOSIS — Z87891 Personal history of nicotine dependence: Secondary | ICD-10-CM | POA: Diagnosis not present

## 2024-04-28 DIAGNOSIS — Z7984 Long term (current) use of oral hypoglycemic drugs: Secondary | ICD-10-CM | POA: Diagnosis not present

## 2024-04-28 DIAGNOSIS — Z01419 Encounter for gynecological examination (general) (routine) without abnormal findings: Secondary | ICD-10-CM | POA: Diagnosis not present

## 2024-04-28 DIAGNOSIS — Z01818 Encounter for other preprocedural examination: Secondary | ICD-10-CM | POA: Diagnosis not present

## 2024-04-28 HISTORY — DX: Other specified disorders of bone density and structure, unspecified site: M85.80

## 2024-04-28 HISTORY — DX: Malignant (primary) neoplasm, unspecified: C80.1

## 2024-04-28 LAB — GLUCOSE, CAPILLARY: Glucose-Capillary: 136 mg/dL — ABNORMAL HIGH (ref 70–99)

## 2024-04-28 LAB — HEMOGLOBIN A1C
Hgb A1c MFr Bld: 7.2 % — ABNORMAL HIGH (ref 4.8–5.6)
Mean Plasma Glucose: 159.94 mg/dL

## 2024-04-28 NOTE — Telephone Encounter (Signed)
 I left voicemail for patient to return my call if scheduled f/u date/time on 05/08/2024 does not work for her.

## 2024-04-28 NOTE — Progress Notes (Signed)
 PCP - Dr. Kennieth Leech Cardiologist - denies Oncologist: Loretha Ash, MD  PPM/ICD - n/a Device Orders -  Rep Notified -   Chest x-ray - n/a EKG - 04/28/24 Stress Test - denies ECHO - 2005 Cardiac Cath - denies  Sleep Study - denies CPAP -   Fasting Blood Sugar - 90-115 Checks Blood Sugar normally wears a Continuous Monitor. Currently not in place, will replace post op  Last dose of GLP1 agonist-  n/a GLP1 instructions:   Blood Thinner Instructions: n/a Aspirin Instructions:  ERAS Protcol - clears until 1215 PRE-SURGERY Ensure or G2-   COVID TEST- n/a   Anesthesia review: Yes--seed  Patient denies shortness of breath, fever, cough and chest pain at PAT appointment   All instructions explained to the patient, with a verbal understanding of the material. Patient agrees to go over the instructions while at home for a better understanding.

## 2024-04-29 ENCOUNTER — Inpatient Hospital Stay: Admitting: Licensed Clinical Social Worker

## 2024-04-29 NOTE — Progress Notes (Signed)
 [facility] CSW Counseling Note  Patient was referred by self. Treatment type: Individual  Presenting Concerns: Patient and/or family reports the following symptoms/concerns: depression and stress Duration of problem: 1 month (since diagnosis) ; Severity of problem: moderate   Orientation:oriented to person, place, time/date, and situation.   Affect: Appropriate, Congruent, and Tearful Risk of harm to self or others: No plan to harm self or others  Patient and/or Family's Strengths/Protective Factors: Ability for insight  Capable of independent living  Communication skills  Motivation for treatment/growth  Religious Affiliation  Supportive family/friends      Goals Addressed: Patient will:  Reduce symptoms of: depression and stress Increase healthy adjustment to current life circumstances Increase adequate support systems for patient   Progress towards Goals: Initial   Interventions: Interventions utilized:  Supportive Counseling and Link to [employer]  PHQ 9     04/23/2024   11:47 AM  PHQ9 SCORE ONLY  PHQ-9 Total Score 17       Assessment: Patient currently experiencing increased depression and stress since diagnosis.  This includes stress around financial/ medical concerns as well as relationship dynamics that are highlighted with this health issue.  CSW provided supportive counsel around interpersonal concerns. Discussed facing mortality after hearing about a cancer diagnosis and the perspective-taking accompanying it.  CSW and pt reviewed potential financial assistance noted below and pt signed forms with [employer] for [employer] application. - [employer] [facility] [employer]- referral made today via [place] - [place]- will apply once it re-opens (expected to be January) - [employer]- pt will send CSW a bill - [place]- if Medicaid is not approved   - [employer] [facility] [place]- retreats for breast cancer patients/ survivors- pt is potentially interested as something  to look forward to    Plan: Follow up with CSW: 12/18 prior to appt with Dr. Loretha Brooklyn recommendations: look through resource information given today. Give grace to yourself on being human and having human reactions to a stressful time Referral(s): [employer] [facility]       Shmuel Girgis E Rylann Munford, LCSW

## 2024-04-29 NOTE — Anesthesia Preprocedure Evaluation (Signed)
 Anesthesia Evaluation    Airway        Dental   Pulmonary Current Smoker and Patient abstained from smoking.          Cardiovascular hypertension,      Neuro/Psych    GI/Hepatic   Endo/Other  diabetes    Renal/GU      Musculoskeletal   Abdominal   Peds  Hematology   Anesthesia Other Findings   Reproductive/Obstetrics                              Anesthesia Physical Anesthesia Plan  ASA:   Anesthesia Plan:    Post-op Pain Management:    Induction:   PONV Risk Score and Plan:   Airway Management Planned:   Additional Equipment:   Intra-op Plan:   Post-operative Plan:   Informed Consent:   Plan Discussed with:   Anesthesia Plan Comments: (PAT note written 04/29/2024 by Tranice Laduke, PA-C.  )        Anesthesia Quick Evaluation

## 2024-04-29 NOTE — Progress Notes (Signed)
 Anesthesia Chart Review:  Case: [id number] Date/Time: 05/01/24 0715   Procedures:      LUMPECTOMY WITH MAGNETIC MARKER LOCALIZATION (Right: Breast) - GEN w/PEC BLOCK RIGHT BREAST SEED-GUIDED LUMPECTOMY     BIOPSY, LYMPH NODE, SENTINEL (Right) - RIGHT BREAST SEED GUIDED LUMPECTOMY SENTINEL NODE BIOPSY   Anesthesia type: General   Pre-op diagnosis: RIGHT BREAST DCIS   [facility]   Surgeons: Ebbie Cough, MD       DISCUSSION: Patient is a 63 year old female scheduled for the above procedure.  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  A1c 7.2%.   Anesthesia team to evaluate on the day of surgery. Mag seed placement at [facility].   VS: BP 113/68   Pulse 81   Temp 36.7 C (Oral)   Resp 18   Ht 5' (1.524 m)   Wt 60.9 kg   SpO2 99%   BMI 26.21 kg/m   PROVIDERS: Benjamine Aland, MD is PCP  Amber Stalls, MD is DOUGLASS Dewey Rush, MD is RAD-ONC   LABS: Labs on 04/23/2024 at [facility] include: Lab Results  Component Value Date   WBC 8.5 04/23/2024   HGB 13.9 04/23/2024   HCT 42.0 04/23/2024   PLT 451 (H) 04/23/2024   GLUCOSE 139 (H) 04/23/2024   ALT 18 04/23/2024   AST 20 04/23/2024   NA 139 04/23/2024   K 3.5 04/23/2024   CL 98 04/23/2024   CREATININE 0.80 04/23/2024   BUN 14 04/23/2024   CO2 29 04/23/2024   TSH 2.066 03/05/2006   HGBA1C 7.2 (H) 04/28/2024    EKG: 04/28/2024: NSR   CV: Echo 12/24/2003: SUMMARY   -  Overall left ventricular systolic function was normal. Left         ventricular ejection fraction was estimated , range being 55         % to 60 %.. There were no left ventricular regional wall         motion abnormalities.   -  There was trivial aortic valvular regurgitation.   -  Normal appearing mitral valve, no prolapse or MR seen.   Past Medical History:  Diagnosis Date    Arthritis    feet   Cancer (HCC)    Breast   Diabetes mellitus    type 2   Dysrhythmia    Family history of adverse reaction to anesthesia    father: post-op nausea and vomiting   Hypertension    Osteopenia    Wears glasses    Wears partial dentures    partial upper    Past Surgical History:  Procedure Laterality Date   ABDOMINAL HYSTERECTOMY     in her 30s   BREAST BIOPSY Right    BREAST SURGERY  2 different occasions, over 5 years ago   cyst removal BIL   CHOLECYSTECTOMY N/A 12/25/2016   Procedure: LYSIS OF ADHESIONS, LAPAROSCOPIC CHOLECYSTECTOMY WITH INTRAOPERATIVE CHOLANGIOGRAM;  Surgeon: Tanda Locus, MD;  Location: [facility];  Service: General;  Laterality: N/A;   COLON SURGERY  at least 6 yrs ago per patient   polyps and fissures    DIAGNOSTIC LAPAROSCOPY  over 20 years ago   exploratory lap   FOOT SURGERY  05/23/1983   left   INSERTION OF MESH N/A 11/13/2014   Procedure: INSERTION OF MESH;  Surgeon: Lynda Leos, MD;  Location: [facility];  Service: General;  Laterality:  N/A;   MICROLARYNGOSCOPY N/A 05/19/2014   Procedure: MICROLARYNGOSCOPY WITH EXCISION OF VOCAL CORD LESION;  Surgeon: Lonni FORBES Angle, MD;  Location: [facility];  Service: ENT;  Laterality: N/A;   MICROLARYNGOSCOPY WITH CO2 LASER AND EXCISION OF VOCAL CORD LESION N/A 09/05/2019   Procedure: MICROLARYNGOSCOPY WITH EXCISION OF VOCAL CORD LESION;  Surgeon: Angle Lonni FORBES, MD;  Location: [facility];  Service: ENT;  Laterality: N/A;   rectal fissure     SHOULDER SURGERY Right    UMBILICAL HERNIA REPAIR N/A 11/13/2014   Procedure: LAPAROSCOPIC UMBILICAL HERNIA REPAIR WITH MESH;  Surgeon: Lynda Leos, MD;  Location: [facility];  Service: General;  Laterality: N/A;    MEDICATIONS:  acetaminophen  (TYLENOL ) 500 MG tablet   amLODipine  (NORVASC ) 5 MG tablet   chlorthalidone (HYGROTON) 25 MG tablet   cholecalciferol (VITAMIN D3) 25 MCG (1000 UNIT) tablet   cyanocobalamin  (VITAMIN B12) 1000 MCG tablet   lisinopril  (PRINIVIL ,ZESTRIL ) 40 MG tablet   metFORMIN  (GLUCOPHAGE ) 500 MG tablet   No current facility-administered medications for this encounter.    Isaiah Ruder, PA-C Surgical Short Stay/Anesthesiology [facility] Phone [phone] [facility] Phone [phone] 04/29/2024 1:12 PM

## 2024-04-30 DIAGNOSIS — D0511 Intraductal carcinoma in situ of right breast: Secondary | ICD-10-CM | POA: Diagnosis not present

## 2024-04-30 DIAGNOSIS — C50919 Malignant neoplasm of unspecified site of unspecified female breast: Secondary | ICD-10-CM | POA: Diagnosis not present

## 2024-04-30 NOTE — H&P (Signed)
 26 yof with no family history presents after screening mm that shows mass right medial lower breast. She has c density breast tissue. She has a 2.2 cm irregular mass noted. On US  this is a 2.4x1.3x1.2 cm mass. Axillary US  is negative. Biopsy is dcis but likely invasive (tissue was very fragmented). This is tn with low KI. She is here with her family. No fh. No real prior breast history. No dc. She has now undergone a repeat biopsy that shows dcis only.    Review of Systems: A complete review of systems was obtained from the patient. I have reviewed this information and discussed as appropriate with the patient. See HPI as well for other ROS.  Review of Systems  Cardiovascular: Positive for palpitations.  Musculoskeletal: Positive for joint pain.  Psychiatric/Behavioral: Positive for depression.  All other systems reviewed and are negative.  Medical History: Past Medical History:  Diagnosis Date  Arthritis  Diabetes mellitus without complication (CMS/HHS-HCC)  Hypertension   Patient Active Problem List  Diagnosis  Malignant neoplasm of lower-inner quadrant of right breast of female, estrogen receptor negative (CMS/HHS-HCC)   Past Surgical History:  Procedure Laterality Date  Microlaryngoscopy 05/19/2014  Umbilical hernia repair with mesh 11/13/2014  CHOLECYSTECTOMY 12/25/2016  Microlaryngoscopy with co2 laser and excision of vocal cord lesion 09/05/2019  breast surgery  x2  COLON SURGERY  colon surgery  DIAGNOSTIC LAPAROSCOPY  HYSTERECTOMY  rectal fissure repair    Allergies  Allergen Reactions  Latex Itching  Hydrochlorothiazide Other (See Comments)  Reaction: Muscle spasms   Current Outpatient Medications on File Prior to Visit  Medication Sig Dispense Refill  amLODIPine  (NORVASC ) 5 MG tablet Take 5 mg by mouth once daily  chlorthalidone 25 MG tablet Take 25 mg by mouth once daily  lisinopriL  (ZESTRIL ) 40 MG tablet Take 40 mg by mouth once daily  metFORMIN   (GLUCOPHAGE ) 500 MG tablet Take 1,000 mg by mouth 2 (two) times daily with meals   Family History  Problem Relation Age of Onset  High blood pressure (Hypertension) Mother  Hyperlipidemia (Elevated cholesterol) Mother  Diabetes Mother  Deep vein thrombosis (DVT or abnormal blood clot formation) Mother  Stroke Father  High blood pressure (Hypertension) Father  Hyperlipidemia (Elevated cholesterol) Father  Coronary Artery Disease (Blocked arteries around heart) Father  Diabetes Father  Deep vein thrombosis (DVT or abnormal blood clot formation) Father  High blood pressure (Hypertension) Sister  High blood pressure (Hypertension) Brother    Social History   Tobacco Use  Smoking Status Every Day  Current packs/day: 0.80  Types: Cigarettes  Smokeless Tobacco Never  Marital status: Married  Tobacco Use  Smoking status: Every Day  Current packs/day: 0.80  Types: Cigarettes  Smokeless tobacco: Never  Substance and Sexual Activity  Alcohol use: Never  Drug use: Never    Objective:   Physical Exam Vitals reviewed.  Constitutional:  Appearance: Normal appearance.  Chest:  Breasts: Right: Mass present. No inverted nipple or nipple discharge.  Left: No inverted nipple, mass or nipple discharge.  Lymphadenopathy:  Upper Body:  Right upper body: No supraclavicular or axillary adenopathy.  Left upper body: No supraclavicular or axillary adenopathy.  Neurological:  Mental Status: She is alert.    Assessment and Plan:   Malignant neoplasm of lower-inner quadrant of right breast of female, estrogen receptor negative (CMS/HHS-HCC)  Right breast seed guided lumpectomy, right ax sn biopsy  With repeat biopsy of dcis discussed with oncology. Need to do primary surgery before adjuvant decisions now.  We discussed a sentinel lymph node biopsy as she does not appear to having lymph node involvement right now. We discussed the performance of that with injection of Magtrace. We  discussed small risk of skin discoloration. We discussed that there is a chance of having a positive node with a sentinel lymph node biopsy and we will await the permanent pathology to make any other first further decisions in terms of her treatment. We discussed up to a 5% risk lifetime of chronic shoulder pain as well as lymphedema associated with a sentinel lymph node biopsy. Will do sozo preop and then follow up long term.   We discussed the options for treatment of the breast cancer which included lumpectomy versus a mastectomy. We discussed the performance of the lumpectomy with radioactive seed or Magseed placement. We discussed a 5-10% chance of a positive margin requiring reexcision in the operating room. We also discussed that she might need radiation therapy if she undergoes lumpectomy.  We discussed mastectomy and the postoperative care for that as well. Mastectomy can be followed by reconstruction. The decision for lumpectomy vs mastectomy has no impact on decision for chemotherapy. Most mastectomy patients will not need radiation therapy. We discussed that there is no difference in her survival whether she undergoes lumpectomy with radiation therapy and/or antiestrogen therapy versus a mastectomy. There is also no real difference between her recurrence in the breast.  We discussed the risks of operation including bleeding, infection, possible reoperation. She understands her further therapy will be based on what her stages at the time of her operation.

## 2024-05-01 ENCOUNTER — Ambulatory Visit (HOSPITAL_COMMUNITY): Admitting: Anesthesiology

## 2024-05-01 ENCOUNTER — Ambulatory Visit (HOSPITAL_COMMUNITY)
Admission: RE | Admit: 2024-05-01 | Discharge: 2024-05-01 | Disposition: A | Discharge status: Home / Self Care | Attending: General Surgery | Admitting: General Surgery

## 2024-05-01 ENCOUNTER — Encounter: Admission: RE | Disposition: A | Payer: Self-pay | Attending: General Surgery

## 2024-05-01 ENCOUNTER — Encounter (HOSPITAL_COMMUNITY): Payer: Self-pay | Admitting: General Surgery

## 2024-05-01 ENCOUNTER — Ambulatory Visit: Payer: Self-pay

## 2024-05-01 ENCOUNTER — Other Ambulatory Visit: Payer: Self-pay

## 2024-05-01 ENCOUNTER — Ambulatory Visit (HOSPITAL_COMMUNITY): Payer: Self-pay | Admitting: Vascular Surgery

## 2024-05-01 ENCOUNTER — Telehealth: Payer: Self-pay

## 2024-05-01 DIAGNOSIS — Z87891 Personal history of nicotine dependence: Secondary | ICD-10-CM | POA: Diagnosis not present

## 2024-05-01 DIAGNOSIS — Z1379 Encounter for other screening for genetic and chromosomal anomalies: Secondary | ICD-10-CM

## 2024-05-01 DIAGNOSIS — E119 Type 2 diabetes mellitus without complications: Secondary | ICD-10-CM | POA: Diagnosis not present

## 2024-05-01 DIAGNOSIS — C50311 Malignant neoplasm of lower-inner quadrant of right female breast: Secondary | ICD-10-CM | POA: Diagnosis not present

## 2024-05-01 DIAGNOSIS — I10 Essential (primary) hypertension: Secondary | ICD-10-CM | POA: Diagnosis not present

## 2024-05-01 DIAGNOSIS — D0511 Intraductal carcinoma in situ of right breast: Secondary | ICD-10-CM | POA: Diagnosis not present

## 2024-05-01 DIAGNOSIS — G8918 Other acute postprocedural pain: Secondary | ICD-10-CM | POA: Diagnosis not present

## 2024-05-01 HISTORY — PX: SENTINEL NODE BIOPSY: SHX6608

## 2024-05-01 LAB — GLUCOSE, CAPILLARY
Glucose-Capillary: 149 mg/dL — ABNORMAL HIGH (ref 70–99)
Glucose-Capillary: 148 mg/dL — ABNORMAL HIGH (ref 70–99)

## 2024-05-01 SURGERY — LUMPECTOMY WITH MAGNETIC MARKER LOCALIZATION
Anesthesia: General | Site: Breast | Laterality: Right

## 2024-05-01 MED ORDER — AMISULPRIDE (ANTIEMETIC) 5 MG/2ML IV SOLN: 10.0000 mg | Freq: Once | INTRAVENOUS | Status: DC | PRN

## 2024-05-01 MED ORDER — CHLORHEXIDINE GLUCONATE CLOTH 2 % EX PADS: 6.0000 | MEDICATED_PAD | Freq: Once | CUTANEOUS | Status: DC

## 2024-05-01 MED ORDER — MIDAZOLAM HCL (PF) 2 MG/2ML IJ SOLN
INTRAMUSCULAR | Status: DC | PRN
  Administered 2024-05-01 (×2): 1 mg via INTRAVENOUS

## 2024-05-01 MED ORDER — OXYCODONE HCL 5 MG PO TABS: 5.0000 mg | ORAL_TABLET | ORAL | Status: DC | PRN

## 2024-05-01 MED ORDER — LIDOCAINE 2% (20 MG/ML) 5 ML SYRINGE
INTRAMUSCULAR | Status: AC
  Filled 2024-05-01: qty 5

## 2024-05-01 MED ORDER — ACETAMINOPHEN 500 MG PO TABS
1000.0000 mg | ORAL_TABLET | ORAL | Status: AC
  Administered 2024-05-01: 1000 mg via ORAL
  Filled 2024-05-01: qty 2

## 2024-05-01 MED ORDER — SODIUM CHLORIDE 0.9% FLUSH: 3.0000 mL | INTRAVENOUS | Status: DC | PRN

## 2024-05-01 MED ORDER — METHYLENE BLUE 20 MG/2ML IV SOSY
PREFILLED_SYRINGE | INTRAVENOUS | Status: AC
  Filled 2024-05-01: qty 2

## 2024-05-01 MED ORDER — FENTANYL CITRATE (PF) 100 MCG/2ML IJ SOLN
INTRAMUSCULAR | Status: AC
  Filled 2024-05-01: qty 2

## 2024-05-01 MED ORDER — 0.9 % SODIUM CHLORIDE (POUR BTL) OPTIME
TOPICAL | Status: DC | PRN
  Administered 2024-05-01: 1000 mL

## 2024-05-01 MED ORDER — PROPOFOL 10 MG/ML IV BOLUS
INTRAVENOUS | Status: AC
  Filled 2024-05-01: qty 20

## 2024-05-01 MED ORDER — BUPIVACAINE-EPINEPHRINE (PF) 0.25% -1:200000 IJ SOLN
INTRAMUSCULAR | Status: AC
  Filled 2024-05-01: qty 30

## 2024-05-01 MED ORDER — ACETAMINOPHEN 325 MG PO TABS: 650.0000 mg | ORAL_TABLET | ORAL | Status: DC | PRN

## 2024-05-01 MED ORDER — ACETAMINOPHEN 650 MG RE SUPP: 650.0000 mg | RECTAL | Status: DC | PRN

## 2024-05-01 MED ORDER — OXYCODONE HCL 5 MG/5ML PO SOLN: 5.0000 mg | Freq: Once | ORAL | Status: AC | PRN

## 2024-05-01 MED ORDER — LACTATED RINGERS IV SOLN: INTRAVENOUS | Status: DC

## 2024-05-01 MED ORDER — OXYCODONE HCL 5 MG PO TABS
ORAL_TABLET | ORAL | Status: AC
  Filled 2024-05-01: qty 1

## 2024-05-01 MED ORDER — CHLORHEXIDINE GLUCONATE 0.12 % MT SOLN
15.0000 mL | Freq: Once | OROMUCOSAL | Status: AC
  Administered 2024-05-01: 15 mL via OROMUCOSAL
  Filled 2024-05-01: qty 15

## 2024-05-01 MED ORDER — MAGTRACE LYMPHATIC TRACER
INTRAMUSCULAR | Status: DC | PRN
  Administered 2024-05-01: 2 mL via INTRAMUSCULAR

## 2024-05-01 MED ORDER — ONDANSETRON HCL 4 MG/2ML IJ SOLN
INTRAMUSCULAR | Status: AC
  Filled 2024-05-01: qty 2

## 2024-05-01 MED ORDER — FENTANYL CITRATE (PF) 100 MCG/2ML IJ SOLN
25.0000 ug | INTRAMUSCULAR | Status: DC | PRN
  Administered 2024-05-01 (×2): 50 ug via INTRAVENOUS

## 2024-05-01 MED ORDER — MIDAZOLAM HCL 2 MG/2ML IJ SOLN
INTRAMUSCULAR | Status: AC
  Filled 2024-05-01: qty 2

## 2024-05-01 MED ORDER — FENTANYL CITRATE (PF) 100 MCG/2ML IJ SOLN
INTRAMUSCULAR | Status: DC | PRN
  Administered 2024-05-01: 50 ug via INTRAVENOUS
  Administered 2024-05-01 (×2): 25 ug via INTRAVENOUS

## 2024-05-01 MED ORDER — LACTATED RINGERS IV SOLN: INTRAVENOUS | Status: DC | PRN

## 2024-05-01 MED ORDER — OXYCODONE HCL 5 MG PO TABS: 5.0000 mg | ORAL_TABLET | Freq: Four times a day (QID) | ORAL | 0 refills | Status: AC | PRN

## 2024-05-01 MED ORDER — LIDOCAINE 2% (20 MG/ML) 5 ML SYRINGE
INTRAMUSCULAR | Status: DC | PRN
  Administered 2024-05-01: 80 mg via INTRAVENOUS

## 2024-05-01 MED ORDER — SODIUM CHLORIDE 0.9 % IV SOLN: 250.0000 mL | INTRAVENOUS | Status: DC | PRN

## 2024-05-01 MED ORDER — SODIUM CHLORIDE 0.9% FLUSH: 3.0000 mL | Freq: Two times a day (BID) | INTRAVENOUS | Status: DC

## 2024-05-01 MED ORDER — ENSURE PRE-SURGERY PO LIQD: 296.0000 mL | Freq: Once | ORAL | Status: DC

## 2024-05-01 MED ORDER — OXYCODONE HCL 5 MG PO TABS
5.0000 mg | ORAL_TABLET | Freq: Once | ORAL | Status: AC | PRN
  Administered 2024-05-01: 5 mg via ORAL

## 2024-05-01 MED ORDER — CEFAZOLIN SODIUM-DEXTROSE 2-4 GM/100ML-% IV SOLN
2.0000 g | INTRAVENOUS | Status: AC
  Administered 2024-05-01: 2 g via INTRAVENOUS
  Filled 2024-05-01: qty 100

## 2024-05-01 MED ORDER — DEXAMETHASONE SOD PHOSPHATE PF 10 MG/ML IJ SOLN
INTRAMUSCULAR | Status: DC | PRN
  Administered 2024-05-01: 10 mg via INTRAVENOUS

## 2024-05-01 MED ORDER — ORAL CARE MOUTH RINSE: 15.0000 mL | Freq: Once | OROMUCOSAL | Status: AC

## 2024-05-01 MED ORDER — BUPIVACAINE HCL (PF) 0.25 % IJ SOLN
INTRAMUSCULAR | Status: DC | PRN
  Administered 2024-05-01: 30 mL via PERINEURAL

## 2024-05-01 MED ORDER — PHENYLEPHRINE HCL-NACL 20-0.9 MG/250ML-% IV SOLN
INTRAVENOUS | Status: DC | PRN
  Administered 2024-05-01: 25 ug/min via INTRAVENOUS

## 2024-05-01 MED ORDER — BUPIVACAINE-EPINEPHRINE 0.25% -1:200000 IJ SOLN
INTRAMUSCULAR | Status: DC | PRN
  Administered 2024-05-01: 5 mL

## 2024-05-01 MED ORDER — PROPOFOL 10 MG/ML IV BOLUS
INTRAVENOUS | Status: DC | PRN
  Administered 2024-05-01: 30 mg via INTRAVENOUS
  Administered 2024-05-01: 100 ug/kg/min via INTRAVENOUS
  Administered 2024-05-01: 30 mg via INTRAVENOUS
  Administered 2024-05-01: 140 mg via INTRAVENOUS

## 2024-05-01 MED ORDER — ONDANSETRON HCL 4 MG/2ML IJ SOLN
INTRAMUSCULAR | Status: DC | PRN
  Administered 2024-05-01: 4 mg via INTRAVENOUS

## 2024-05-01 MED ORDER — INSULIN ASPART 100 UNIT/ML IJ SOLN: 0.0000 [IU] | INTRAMUSCULAR | Status: DC | PRN

## 2024-05-01 SURGICAL SUPPLY — 46 items
BAG COUNTER SPONGE SURGICOUNT (BAG) ×1 IMPLANT
BENZOIN TINCTURE PRP APPL 2/3 (GAUZE/BANDAGES/DRESSINGS) IMPLANT
BINDER BREAST LRG (GAUZE/BANDAGES/DRESSINGS) IMPLANT
BINDER BREAST XLRG (GAUZE/BANDAGES/DRESSINGS) IMPLANT
CANISTER SUCTION 3000ML PPV (SUCTIONS) ×1 IMPLANT
CHLORAPREP W/TINT 26 (MISCELLANEOUS) ×1 IMPLANT
CLIP APPLIE 9.375 MED OPEN (MISCELLANEOUS) IMPLANT
CLIP TI MEDIUM 6 (CLIP) IMPLANT
CNTNR URN SCR LID CUP LEK RST (MISCELLANEOUS) ×1 IMPLANT
COVER PROBE W GEL 5X96 (DRAPES) ×1 IMPLANT
COVER SURGICAL LIGHT HANDLE (MISCELLANEOUS) ×1 IMPLANT
DERMABOND ADVANCED .7 DNX12 (GAUZE/BANDAGES/DRESSINGS) ×1 IMPLANT
DEVICE DUBIN SPECIMEN MAMMOGRA (MISCELLANEOUS) ×1 IMPLANT
DRAPE CHEST BREAST 15X10 FENES (DRAPES) ×1 IMPLANT
ELECT COATED BLADE 2.86 ST (ELECTRODE) ×1 IMPLANT
ELECTRODE REM PT RTRN 9FT ADLT (ELECTROSURGICAL) ×1 IMPLANT
GAUZE 4X4 16PLY ~~LOC~~+RFID DBL (SPONGE) ×1 IMPLANT
GAUZE SPONGE 4X4 12PLY STRL (GAUZE/BANDAGES/DRESSINGS) IMPLANT
GLOVE BIO SURGEON STRL SZ7 (GLOVE) ×2 IMPLANT
GLOVE BIOGEL PI IND STRL 7.5 (GLOVE) ×1 IMPLANT
GOWN STRL REUS W/ TWL LRG LVL3 (GOWN DISPOSABLE) ×2 IMPLANT
KIT BASIN OR (CUSTOM PROCEDURE TRAY) ×1 IMPLANT
KIT MARKER MARGIN INK (KITS) ×1 IMPLANT
KIT TURNOVER KIT B (KITS) ×1 IMPLANT
LIGHT WAVEGUIDE WIDE FLAT (MISCELLANEOUS) IMPLANT
NDL 18GX1X1/2 (RX/OR ONLY) (NEEDLE) IMPLANT
NDL FILTER BLUNT 18X1 1/2 (NEEDLE) IMPLANT
NDL HYPO 25GX1X1/2 BEV (NEEDLE) ×1 IMPLANT
PACK GENERAL/GYN (CUSTOM PROCEDURE TRAY) ×1 IMPLANT
PAD ARMBOARD POSITIONER FOAM (MISCELLANEOUS) ×1 IMPLANT
PENCIL SMOKE EVACUATOR (MISCELLANEOUS) ×1 IMPLANT
SOLN 0.9% NACL POUR BTL 1000ML (IV SOLUTION) ×1 IMPLANT
SPIKE FLUID TRANSFER (MISCELLANEOUS) ×1 IMPLANT
STRIP CLOSURE SKIN 1/2X4 (GAUZE/BANDAGES/DRESSINGS) ×1 IMPLANT
STRIP CLOSURE SKIN 1/4X4 (GAUZE/BANDAGES/DRESSINGS) IMPLANT
SUT MNCRL AB 4-0 PS2 18 (SUTURE) ×1 IMPLANT
SUT MON AB 5-0 PS2 18 (SUTURE) IMPLANT
SUT SILK 2 0 SH (SUTURE) IMPLANT
SUT VIC AB 2-0 SH 27X BRD (SUTURE) IMPLANT
SUT VIC AB 2-0 SH 27XBRD (SUTURE) ×1 IMPLANT
SUT VIC AB 3-0 SH 27X BRD (SUTURE) ×1 IMPLANT
SUT VIC AB 3-0 SH 27XBRD (SUTURE) ×1 IMPLANT
SYR 3ML LL SCALE MARK (SYRINGE) IMPLANT
SYR CONTROL 10ML LL (SYRINGE) ×1 IMPLANT
TOWEL GREEN STERILE (TOWEL DISPOSABLE) ×1 IMPLANT
TOWEL GREEN STERILE FF (TOWEL DISPOSABLE) ×1 IMPLANT

## 2024-05-01 NOTE — Discharge Instructions (Signed)
 [facility] Office Phone Number [phone]  POST OP INSTRUCTIONS Take 400 mg of ibuprofen every 8 hours or 650 mg tylenol  every 6 hours for next 72 hours then as needed. Use ice several times daily also.  A prescription for pain medication may be given to you upon discharge.  Take your pain medication as prescribed, if needed.  If narcotic pain medicine is not needed, then you may take acetaminophen  (Tylenol ), naprosyn (Alleve) or ibuprofen (Advil) as needed. Take your usually prescribed medications unless otherwise directed If you need a refill on your pain medication, please contact your pharmacy.  They will contact our office to request authorization.  Prescriptions will not be filled after 5pm or on week-ends. You should eat very light the first 24 hours after surgery, such as soup, crackers, pudding, etc.  Resume your normal diet the day after surgery. Most patients will experience some swelling and bruising in the breast.  Ice packs and a good support bra will help.  Wear the breast binder provided or a sports bra for 72 hours day and night.  After that wear a sports bra during the day until you return to the office. Swelling and bruising can take several days to resolve.  It is common to experience some constipation if taking pain medication after surgery.  Increasing fluid intake and taking a stool softener will usually help or prevent this problem from occurring.  A mild laxative (Milk of Magnesia or Miralax) should be taken according to package directions if there are no bowel movements after 48 hours. I used skin glue on the incision, you may shower in 24 hours.  The glue will flake off over the next 2-3 weeks as will the steristrips.   Any sutures or staples will be removed at the office during your follow-up visit. ACTIVITIES:  You may resume regular daily activities (gradually increasing) beginning the next day.  Wearing a good support bra or sports bra minimizes pain and  swelling.  You may have sexual intercourse when it is comfortable. You may drive when you no longer are taking prescription pain medication, you can comfortably wear a seatbelt, and you can safely maneuver your car and apply brakes. RETURN TO WORK:  ______________________________________________________________________________________ Hannah [name] should see your doctor in the office for a follow-up appointment approximately two to three weeks after your surgery.  Your doctor's nurse will typically make your follow-up appointment when she calls you with your pathology report.  Expect your pathology report 3-4 business days after your surgery.  You may call to check if you do not hear from us  after three days.  WHEN TO CALL DR Taniela Feltus: Fever over 101.0 Nausea and/or vomiting. Extreme swelling or bruising. Continued bleeding from incision. Increased pain, redness, or drainage from the incision.  The clinic staff is available to answer your questions during regular business hours.  Please don't hesitate to call and ask to speak to one of the nurses for clinical concerns.  If you have a medical emergency, go to the nearest emergency room or call 911.  A surgeon from [facility] is always on call at the hospital.  For further questions, please visit [email or web address]

## 2024-05-01 NOTE — Telephone Encounter (Signed)
 I contacted Hannah [name], who also had her sister on the phone since she is recovering from surgery today, to discuss her genetic testing results. The test that was ordered was the [facility] BRCAPlus gene panel which includes sequencing and rearrangement analysis for the following 13 genes: ATM, BARD1, BRCA1, BRCA2, CDH1, CHEK2, NF1, PALB2, PTEN, RAD51C, RAD51D, STK11 and TP53 (sequencing and deletion/duplication). This test was run concurrently with the [facility] CancerNext + RNAinsight gene panel which includes sequencing, rearrangement analysis, and RNA analysis for the following 40 genes: APC, ATM, BAP1, BARD1, BMPR1A, BRCA1, BRCA2, BRIP1, CDH1, CDKN2A, CHEK2, FH, FLCN, MET, MLH1, MSH2, MSH6, MUTYH, NF1, NTHL1, PALB2, PMS2, PTEN, RAD51C, RAD51D, RPS20, SMAD4, STK11, TP53, TSC1, TSC2, and VHL (sequencing and deletion/duplication); AXIN2, HOXB13, MBD4, MSH3, POLD1 and POLE (sequencing only); EPCAM and GREM1 (deletion/duplication only).   The results of the [facility] CancerNext 40 gene panel were resulted before the [facility] BRCAPlus 13 gene panel, and I disclosed this to Hannah [name] and her sister. I disclosed the results of the 40 gene panel and said I didn't anticipate needed to call a second time presuming the results of the [facility] BRCAPlus panel were concordant with the CancerNext panel which they should be.  The report date is 05/01/2024. No pathogenic variants were identified in the 40 genes analyzed. Detailed clinic note to follow.    The test report has been scanned into EPIC and is located under the Molecular Pathology section of the Results Review tab.  A portion of the result report is included below for reference.      Warren Ahle, MS, [facility] Cancer Genetic Counselor [email or web address] [phone]

## 2024-05-01 NOTE — Transfer of Care (Signed)
 Immediate Anesthesia Transfer of Care Note  Patient: Hannah [name]  Procedure(s) Performed: LUMPECTOMY WITH MAGNETIC SEED MARKER LOCALIZATION (Right: Breast) BIOPSY, LYMPH NODE, SENTINEL (Right: Breast)  Patient Location: PACU  Anesthesia Type:General and Regional  Level of Consciousness: awake, alert , and oriented  Airway & Oxygen Therapy: Patient Spontanous Breathing and Patient connected to nasal cannula oxygen  Post-op Assessment: Report given to RN, Post -op Vital signs reviewed and stable, and Patient moving all extremities X 4  Post vital signs: Reviewed and stable  Last Vitals:  Vitals Value Taken Time  BP 133/72 05/01/24 08:36  Temp    Pulse 85 05/01/24 08:42  Resp 13 05/01/24 08:42  SpO2 100 % 05/01/24 08:42  Vitals shown include unfiled device data.  Last Pain:  Vitals:   05/01/24 0617  TempSrc:   PainSc: 1       Patients Stated Pain Goal: 0 (05/01/24 9388)  Complications: No notable events documented.

## 2024-05-01 NOTE — Op Note (Signed)
 Preoperative diagnosis: DCIS with likely invasion, right breast Postoperative diagnosis: Same as above Procedure: 1.  Right breast Magseed guided lumpectomy 2.  Injection of mag trace for sentinel lymph node identification 3.  Right deep axillary sentinel lymph node biopsy Surgeon: Dr. Adina Bury Anesthesia: General With a pectoral block Estimated blood loss: Minimal Specimens: 1.  Right breast tissue containing seed and clip marked with paint 2.  Additional lateral margin marked short superior, long lateral, double deep 3.  Right deep axillary sentinel lymph nodes with the highest count being 2583 Complications: None Drains: None Sponge needle count was correct completion Disposition recovery stable condition  Indications: This is 63 year old female with a screening mammogram showing a right medial lower breast mass.  This on ultrasound is 2.4 cm.  Her axillary ultrasound was negative.  The initial biopsy was DCIS but likely had some invasive disease.  This does appear to be triple negative but it is a low proliferation index.  We repeated her biopsy and this just shows DCIS.  Due to this we elected to proceed with lumpectomy and sentinel lymph node biopsy to figure out what her adjuvant therapy needs to be.  Procedure: After informed consent was obtained she first underwent a pectoral block.  She had a Magseed placed prior to surgery.  I had these mammograms available in the operating room.  She was given antibiotics.  SCDs were placed.  She was placed under general anesthesia without complication.  She was prepped and draped in a standard sterile surgical fashion.  A surgical timeout was then performed.  I injected 2 cc of mag trace in the subareolar position.  I massaged this for 5 minutes.  I confirmed with the probe that there was activity in the axilla.  I then identified the [facility] in the lower inner quadrant.  She had a 1 cm ulceration overlying this.  I elected to make an  elliptical incision to encompass this ulceration and then used the probe to remove the seed and the surrounding tissue with an attempt to get a clear margin.  Once I remove this I did a mammogram and this confirmed removal of the seed and the clip.  The posterior margin is the muscle as this was very close to that.  I then did 3D imaging and thought I was close to my lateral margin so I remove that.  I then mobilized the breast tissue off the muscle.  I placed a couple clips in the cavity.  I obtained hemostasis.  I closed the breast tissue with 2-0 Vicryl.  I then closed the skin with 3-0 Vicryl and 4-0 Monocryl.  Glue Steri-Strips were eventually applied.  I then made an incision just below the axillary hairline.  I carried this through the axillary fascia.  Immediately upon entering I noticed a brown node.  I remove this with what appears to be another node right next to it.  The activity was as above.  There was no background activity once I had done this.  I then obtained hemostasis.  I closed the axilla with 2-0 Vicryl, 3-0 Vicryl and 4-0 Monocryl.  Glue and Steri-Strips were applied.  She tolerated this well was extubated and transferred recovery stable.

## 2024-05-01 NOTE — Anesthesia Procedure Notes (Signed)
 Anesthesia Regional Block: Pectoralis block   Pre-Anesthetic Checklist: , timeout performed,  Correct Patient, Correct Site, Correct Laterality,  Correct Procedure, Correct Position, site marked,  Risks and benefits discussed,  Surgical consent,  Pre-op evaluation,  At surgeon's request and post-op pain management  Laterality: Right  Prep: chloraprep       Needles:  Injection technique: Single-shot  Needle Type: Echogenic Stimulator Needle     Needle Length: 9cm  Needle Gauge: 21     Additional Needles:   Procedures:,,,, ultrasound used (permanent image in chart),,    Narrative:  Start time: 05/01/2024 6:57 AM End time: 05/01/2024 7:02 AM Injection made incrementally with aspirations every 5 mL.  Performed by: Personally  Anesthesiologist: Peggye Delon Brunswick, MD  Additional Notes: Discussed risks and benefits of nerve block including, but not limited to, prolonged and/or permanent nerve injury involving sensory and/or motor function. Monitors were applied and a time-out was performed. The nerve and associated structures were visualized under ultrasound guidance. After negative aspiration, local anesthetic was slowly injected around the nerve. There was no evidence of high pressure during the procedure. There were no paresthesias. VSS remained stable and the patient tolerated the procedure well.

## 2024-05-01 NOTE — Interval H&P Note (Signed)
 History and Physical Interval Note:  05/01/2024 6:54 AM  Hannah [name]  has presented today for surgery, with the diagnosis of RIGHT BREAST DCIS.  The various methods of treatment have been discussed with the patient and family. After consideration of risks, benefits and other options for treatment, the patient has consented to  Procedures with comments: LUMPECTOMY WITH MAGNETIC MARKER LOCALIZATION (Right) - GEN w/PEC BLOCK RIGHT BREAST SEED-GUIDED LUMPECTOMY BIOPSY, LYMPH NODE, SENTINEL (Right) - RIGHT BREAST SEED GUIDED LUMPECTOMY SENTINEL NODE BIOPSY as a surgical intervention.  The patient's history has been reviewed, patient examined, no change in status, stable for surgery.  I have reviewed the patient's chart and labs.  Questions were answered to the patient's satisfaction.     Donnice Bury

## 2024-05-01 NOTE — Anesthesia Postprocedure Evaluation (Signed)
 Anesthesia Post Note  Patient: Hannah [name]  Procedure(s) Performed: LUMPECTOMY WITH MAGNETIC SEED MARKER LOCALIZATION (Right: Breast) BIOPSY, LYMPH NODE, SENTINEL (Right: Breast)     Patient location during evaluation: PACU Anesthesia Type: General Level of consciousness: awake Pain management: pain level controlled Vital Signs Assessment: post-procedure vital signs reviewed and stable Respiratory status: spontaneous breathing, nonlabored ventilation and respiratory function stable Cardiovascular status: blood pressure returned to baseline and stable Postop Assessment: no apparent nausea or vomiting Anesthetic complications: no   No notable events documented.  Last Vitals:  Vitals:   05/01/24 0915 05/01/24 0930  BP: 130/70 (!) 140/76  Pulse: (!) 58 67  Resp: 10 12  Temp:  (!) 36.4 C  SpO2: 96% 96%    Last Pain:  Vitals:   05/01/24 0907  TempSrc:   PainSc: 4                  Delon Aisha Arch

## 2024-05-01 NOTE — Anesthesia Procedure Notes (Signed)
 Procedure Name: LMA Insertion Date/Time: 05/01/2024 7:33 AM  Performed by: Carolee Lauraine DASEN, CRNAPre-anesthesia Checklist: Patient identified, Emergency Drugs available, Suction available and Patient being monitored Patient Re-evaluated:Patient Re-evaluated prior to induction Oxygen Delivery Method: Circle System Utilized Preoxygenation: Pre-oxygenation with 100% oxygen Induction Type: IV induction LMA: LMA inserted LMA Size: 4.0 Number of attempts: 1 Airway Equipment and Method: Bite block Placement Confirmation: positive ETCO2 Tube secured with: Tape Dental Injury: Teeth and Oropharynx as per pre-operative assessment

## 2024-05-01 NOTE — Progress Notes (Signed)
 HPI:  [name] was previously seen in the [facility] due to a personal history of breast cancer and concerns regarding a hereditary predisposition to cancer. Please refer to our prior cancer genetics clinic note for more information regarding our discussion, assessment and recommendations, at the time. [name] recent genetic test results were disclosed to her, as were recommendations warranted by these results. These results and recommendations are discussed in more detail below.  CANCER HISTORY:  Oncology History  Malignant neoplasm of lower-inner quadrant of right breast of female, estrogen receptor negative (HCC)  04/18/2024 Initial Diagnosis   Malignant neoplasm of lower-inner quadrant of right breast of female, estrogen receptor negative (HCC)   05/01/2024 Genetic Testing   Negative [facility] + RNA insight. Report date 05/01/2024.  [facility] + RNAinsight gene panel which includes sequencing, rearrangement analysis, and RNA analysis for the following 40 genes: APC, ATM, BAP1, BARD1, BMPR1A, BRCA1, BRCA2, BRIP1, CDH1, CDKN2A, CHEK2, FH, FLCN, MET, MLH1, MSH2, MSH6, MUTYH, NF1, NTHL1, PALB2, PMS2, PTEN, RAD51C, RAD51D, RPS20, SMAD4, STK11, TP53, TSC1, TSC2, and VHL (sequencing and deletion/duplication); AXIN2, HOXB13, MBD4, MSH3, POLD1 and POLE (sequencing only); EPCAM and GREM1 (deletion/duplication only).      FAMILY HISTORY:  We obtained a detailed, 4-generation family history.  Significant diagnoses are listed below: Family History  Problem Relation Age of Onset   Cancer Maternal Cousin        blood cancer       [name] reports her maternal cousin had blood cancer diagnosed at age 25. She does not report any family history of other cancers. She reported having limited information about the paternal side of the family.   [name] is unaware of previous family history of genetic testing for hereditary cancer risks. There is no  reported Ashkenazi Jewish ancestry. There is no known consanguinity.  GENETIC TEST RESULTS: Genetic testing reported out on 05/01/2024 through the [facility] + RNAinsight gene panel which includes sequencing, rearrangement analysis, and RNA analysis for the following 40 genes: APC, ATM, BAP1, BARD1, BMPR1A, BRCA1, BRCA2, BRIP1, CDH1, CDKN2A, CHEK2, FH, FLCN, MET, MLH1, MSH2, MSH6, MUTYH, NF1, NTHL1, PALB2, PMS2, PTEN, RAD51C, RAD51D, RPS20, SMAD4, STK11, TP53, TSC1, TSC2, and VHL (sequencing and deletion/duplication); AXIN2, HOXB13, MBD4, MSH3, POLD1 and POLE (sequencing only); EPCAM and GREM1 (deletion/duplication only).   This cancer panel found no pathogenic mutations in any of the genes listed above. The test report has been scanned into EPIC and is located under the Molecular Pathology section of the Results Review tab.  A portion of the result report is included below for reference.     We discussed with [name] that because current genetic testing is not perfect, it is possible there may be a gene mutation in one of these genes that current testing cannot detect, but that chance is small.  We also discussed, that there could be another gene that has not yet been discovered, or that we have not yet tested, that is responsible for the cancer diagnoses in the family. It is also possible there is a hereditary cause for the cancer in the family that [name] did not inherit and therefore was not identified in her testing.  Therefore, it is important to remain in touch with cancer genetics in the future so that we can continue to offer [name] the most up to date genetic testing.   ADDITIONAL GENETIC TESTING: We discussed with [name] that her genetic testing was fairly extensive.  If there are genes identified to increase cancer risk that can be analyzed in the future, we would be happy to discuss and coordinate this testing at that time.    CANCER SCREENING RECOMMENDATIONS:  [name] test result is considered negative (normal).  This means that we have not identified a hereditary cause for her personal history of breast cancer at this time. Most cancers happen by chance and this negative test suggests that her personal history of breast cancer may fall into this category.    Possible reasons for [name]'s negative genetic test include:  1. There may be a gene mutation in one of these genes that current testing methods cannot detect but that chance is small.  2. There could be another gene that has not yet been discovered, or that we have not yet tested, that is responsible for the cancer diagnoses in the family.  3.  There may be no hereditary risk for cancer in the family. The cancers in [name] and/or her family may be sporadic/familial or due to other genetic and environmental factors. 4. It is also possible there is a hereditary cause for the cancer in the family that [name] did not inherit.  Therefore, it is recommended she continue to follow the cancer management and screening guidelines provided by her oncology and primary healthcare provider. An individual's cancer risk and medical management are not determined by genetic test results alone. Overall cancer risk assessment incorporates additional factors, including personal medical history, family history, and any available genetic information that may result in a personalized plan for cancer prevention and surveillance  An individual's cancer risk and medical management are not determined by genetic test results alone. Overall cancer risk assessment incorporates additional factors, including personal medical history, family history, and any available genetic information that may result in a personalized plan for cancer prevention and surveillance.  RECOMMENDATIONS FOR FAMILY MEMBERS:   Since she did not inherit a identifiable mutation in a cancer predisposition gene included on this panel, her  children could not have inherited a known mutation from her in one of these genes. Individuals in this family might be at some increased risk of developing cancer, over the general population risk, simply due to the family history of cancer.  We recommended women in this family have a yearly mammogram beginning at age 48, or 27 years younger than the earliest onset of cancer, an annual clinical breast exam, and perform monthly breast self-exams. Women in this family should also have a gynecological exam as recommended by their primary provider. All family members should be referred for colonoscopy starting at age 43, or 64 years younger than the earliest onset of cancer.  FOLLOW-UP: Lastly, we discussed with [name] that cancer genetics is a rapidly advancing field and it is possible that new genetic tests will be appropriate for her and/or her family members in the future. We encouraged her to remain in contact with cancer genetics on an annual basis so we can update her personal and family histories and let her know of advances in cancer genetics that may benefit this family.   Our contact number was provided. [name] questions were answered to her satisfaction, and she knows she is welcome to call us  at anytime with additional questions or concerns.   Warren Ahle, MS, [facility] Genetic Counselor [email or web address] [phone]

## 2024-05-02 ENCOUNTER — Encounter (HOSPITAL_COMMUNITY): Payer: Self-pay | Admitting: General Surgery

## 2024-05-07 ENCOUNTER — Telehealth: Payer: Self-pay

## 2024-05-07 ENCOUNTER — Telehealth: Payer: Self-pay | Admitting: *Deleted

## 2024-05-07 NOTE — Telephone Encounter (Signed)
 SPoke with patient to r/s her appt with Dr. Loretha for 12/18 to 12/19 since we are still waiting on prognostics.  I told her not to worry about the appt with Rosaline and that I sent her a message to r/s that also so she wouldn't have to come in 2 days in a row.  Patient appreciative. Confirmed new appt for 12/19 at 1045am.

## 2024-05-07 NOTE — Telephone Encounter (Signed)
 [employer] approved x 12 months, 04/21/2024-04/20/2025 , retro coverage back to 03/22/2024, MID: [id number] P. Patient informed.

## 2024-05-08 ENCOUNTER — Inpatient Hospital Stay: Admitting: Hematology and Oncology

## 2024-05-08 ENCOUNTER — Inpatient Hospital Stay: Admitting: Licensed Clinical Social Worker

## 2024-05-08 LAB — SURGICAL PATHOLOGY

## 2024-05-09 ENCOUNTER — Encounter: Payer: Self-pay | Admitting: *Deleted

## 2024-05-09 ENCOUNTER — Inpatient Hospital Stay: Admitting: Hematology and Oncology

## 2024-05-09 ENCOUNTER — Other Ambulatory Visit: Payer: Self-pay

## 2024-05-09 ENCOUNTER — Inpatient Hospital Stay: Admitting: Licensed Clinical Social Worker

## 2024-05-09 ENCOUNTER — Inpatient Hospital Stay

## 2024-05-09 ENCOUNTER — Ambulatory Visit: Payer: Self-pay | Admitting: Hematology and Oncology

## 2024-05-09 VITALS — BP 118/68 | HR 83 | Temp 97.9°F | Resp 18 | Ht 60.0 in | Wt 135.7 lb

## 2024-05-09 DIAGNOSIS — Z171 Estrogen receptor negative status [ER-]: Secondary | ICD-10-CM

## 2024-05-09 DIAGNOSIS — C50311 Malignant neoplasm of lower-inner quadrant of right female breast: Secondary | ICD-10-CM

## 2024-05-09 LAB — URINALYSIS, COMPLETE (UACMP) WITH MICROSCOPIC
Bilirubin Urine: NEGATIVE
Glucose, UA: NEGATIVE mg/dL
Ketones, ur: NEGATIVE mg/dL
Leukocytes,Ua: NEGATIVE
Nitrite: NEGATIVE
Protein, ur: NEGATIVE mg/dL
Specific Gravity, Urine: 1.013 (ref 1.005–1.030)
pH: 5 (ref 5.0–8.0)

## 2024-05-09 NOTE — Progress Notes (Signed)
 [facility] CONSULT NOTE  Patient Care Team: Benjamine Aland, MD as PCP - General (Family Medicine) Tyree Nanetta SAILOR, RN as Oncology Nurse Navigator Ebbie Cough, MD as Consulting Physician (General Surgery) Loretha Ash, MD as Consulting Physician (Hematology and Oncology) Dewey Rush, MD as Consulting Physician (Radiation Oncology)  CHIEF COMPLAINTS/PURPOSE OF CONSULTATION:  Newly diagnosed breast cancer  HISTORY OF PRESENTING ILLNESS:  Hannah [name] 63 y.o. female is here because of recent diagnosis of right breast cancer  I reviewed her records extensively and collaborated the history with the patient.  SUMMARY OF ONCOLOGIC HISTORY: Oncology History  Malignant neoplasm of lower-inner quadrant of right breast of female, estrogen receptor negative (HCC)  04/18/2024 Initial Diagnosis   Malignant neoplasm of lower-inner quadrant of right breast of female, estrogen receptor negative (HCC)   05/01/2024 Genetic Testing   Negative [facility] + RNA insight. Report date 05/01/2024.  [facility] + RNAinsight gene panel which includes sequencing, rearrangement analysis, and RNA analysis for the following 40 genes: APC, ATM, BAP1, BARD1, BMPR1A, BRCA1, BRCA2, BRIP1, CDH1, CDKN2A, CHEK2, FH, FLCN, MET, MLH1, MSH2, MSH6, MUTYH, NF1, NTHL1, PALB2, PMS2, PTEN, RAD51C, RAD51D, RPS20, SMAD4, STK11, TP53, TSC1, TSC2, and VHL (sequencing and deletion/duplication); AXIN2, HOXB13, MBD4, MSH3, POLD1 and POLE (sequencing only); EPCAM and GREM1 (deletion/duplication only).    05/29/2024 -  Chemotherapy   Patient is on Treatment Plan : BREAST DOSE DENSE AC q14d / PACLitaxel q7d      Discussed the use of AI scribe software for clinical note transcription with the patient, who gave verbal consent to proceed.  History of Present Illness Hannah [name] is a 63 year old female with recently resected stage IIA triple negative invasive ductal carcinoma of the right breast  who presents for postoperative oncology follow-up and discussion of adjuvant therapy.  She underwent right breast lumpectomy with negative margins and sentinel lymph node biopsy on May 01, 2024 for a 2.2 cm high grade, triple negative invasive ductal carcinoma. Pathology confirmed estrogen receptor negative, progesterone receptor negative, and HER2 1+ (negative) status. Staging is T2N0M0, with no clinical or pathological evidence of nodal or distant metastatic disease.  She describes persistent numbness in the right arm and pruritus at the surgical site, which were discussed as expected postoperative findings. She denies additional symptoms or complications related to the surgical site and has no systemic complaints. She denies any history of cardiac disease.  The visit focused on planning adjuvant chemotherapy. The standard ACT regimen (anthracyclines, cyclophosphamide, paclitaxel) for 20 weeks was discussed as the preferred option, with a shorter 12-week regimen (docetaxel and cyclophosphamide) presented as an alternative. She elected to proceed with the 20-week regimen. Plans were made for port placement, pre-chemotherapy echocardiogram, and chemotherapy education.  Rest of the pertinent 10 point ROS reviewed and neg.  MEDICAL HISTORY:  Past Medical History:  Diagnosis Date   Arthritis    feet   Cancer (HCC)    Breast   Diabetes mellitus    type 2   Dysrhythmia    Family history of adverse reaction to anesthesia    father: post-op nausea and vomiting   Hypertension    Osteopenia    Wears glasses    Wears partial dentures    partial upper    SURGICAL HISTORY: Past Surgical History:  Procedure Laterality Date   ABDOMINAL HYSTERECTOMY     in her 30s   BREAST BIOPSY Right    BREAST SURGERY  2 different occasions, over 5 years ago  cyst removal BIL   CHOLECYSTECTOMY N/A 12/25/2016   Procedure: LYSIS OF ADHESIONS, LAPAROSCOPIC CHOLECYSTECTOMY WITH INTRAOPERATIVE  CHOLANGIOGRAM;  Surgeon: Tanda Locus, MD;  Location: [facility];  Service: General;  Laterality: N/A;   COLON SURGERY  at least 6 yrs ago per patient   polyps and fissures    DIAGNOSTIC LAPAROSCOPY  over 20 years ago   exploratory lap   FOOT SURGERY  05/23/1983   left   INSERTION OF MESH N/A 11/13/2014   Procedure: INSERTION OF MESH;  Surgeon: Lynda Leos, MD;  Location: [facility];  Service: General;  Laterality: N/A;   MICROLARYNGOSCOPY N/A 05/19/2014   Procedure: MICROLARYNGOSCOPY WITH EXCISION OF VOCAL CORD LESION;  Surgeon: Lonni FORBES Angle, MD;  Location: [facility];  Service: ENT;  Laterality: N/A;   MICROLARYNGOSCOPY WITH CO2 LASER AND EXCISION OF VOCAL CORD LESION N/A 09/05/2019   Procedure: MICROLARYNGOSCOPY WITH EXCISION OF VOCAL CORD LESION;  Surgeon: Angle Lonni FORBES, MD;  Location: [facility];  Service: ENT;  Laterality: N/A;   rectal fissure     SENTINEL NODE BIOPSY Right 05/01/2024   Procedure: BIOPSY, LYMPH NODE, SENTINEL;  Surgeon: Ebbie Cough, MD;  Location: [facility] OR;  Service: General;  Laterality: Right;   SHOULDER SURGERY Right    UMBILICAL HERNIA REPAIR N/A 11/13/2014   Procedure: LAPAROSCOPIC UMBILICAL HERNIA REPAIR WITH MESH;  Surgeon: Lynda Leos, MD;  Location: [facility];  Service: General;  Laterality: N/A;    SOCIAL HISTORY: Social History   Socioeconomic History   Marital status: Married    Spouse name: Not on file   Number of children: Not on file   Years of education: Not on file   Highest education level: Not on file  Occupational History   Not on file  Tobacco Use   Smoking status: Every Day    Current packs/day: 0.75    Average packs/day: 0.8 packs/day for 55.0 years (41.2 ttl pk-yrs)    Types: Cigarettes    Start date: 3   Smokeless tobacco: Never   Tobacco comments:    As of 04/28/24: cut back to 1/2 pack a day from 1 pack a day  Vaping Use   Vaping status: Never Used  Substance and Sexual  Activity   Alcohol use: No   Drug use: No   Sexual activity: Yes  Other Topics Concern   Not on file  Social History Narrative   Not on file   Social Drivers of Health   Tobacco Use: High Risk (05/01/2024)   Patient History    Smoking Tobacco Use: Every Day    Smokeless Tobacco Use: Never    Passive Exposure: Not on file  Financial Resource Strain: Not on file  Food Insecurity: Food Insecurity Present (04/23/2024)   Epic    Worried About Radiation Protection Practitioner of Food in the Last Year: Sometimes true    Ran Out of Food in the Last Year: Sometimes true  Transportation Needs: No Transportation Needs (04/23/2024)   Epic    Lack of Transportation (Medical): No    Lack of Transportation (Non-Medical): No  Physical Activity: Not on file  Stress: Not on file  Social Connections: Not on file  Intimate Partner Violence: Not At Risk (04/23/2024)   Epic    Fear of Current or Ex-Partner: No    Emotionally Abused: No    Physically Abused: No    Sexually Abused: No  Depression (PHQ2-9): High Risk (04/23/2024)   Depression (PHQ2-9)    PHQ-2  Score: 17  Alcohol Screen: Not on file  Housing: Low Risk (04/23/2024)   Epic    Unable to Pay for Housing in the Last Year: No    Number of Times Moved in the Last Year: 0    Homeless in the Last Year: No  Utilities: Not At Risk (04/23/2024)   Epic    Threatened with loss of utilities: No  Health Literacy: Not on file    FAMILY HISTORY: Family History  Problem Relation Age of Onset   Cancer Maternal Cousin        blood cancer    ALLERGIES:  is allergic to hydrochlorothiazide and latex.  MEDICATIONS:  Current Outpatient Medications  Medication Sig Dispense Refill   acetaminophen  (TYLENOL ) 500 MG tablet Take 500-1,000 mg by mouth every 6 (six) hours as needed (pain.).     amLODipine  (NORVASC ) 5 MG tablet Take 5 mg by mouth in the morning.     chlorthalidone (HYGROTON) 25 MG tablet Take 25 mg by mouth in the morning.     cholecalciferol (VITAMIN D3)  25 MCG (1000 UNIT) tablet Take 1,000 Units by mouth in the morning.     cyanocobalamin (VITAMIN B12) 1000 MCG tablet Take 1,000 mcg by mouth in the morning.     lisinopril  (PRINIVIL ,ZESTRIL ) 40 MG tablet Take 40 mg by mouth in the morning.     metFORMIN  (GLUCOPHAGE ) 500 MG tablet Take 500 mg by mouth 2 (two) times daily with a meal.     oxyCODONE  (OXY IR/ROXICODONE ) 5 MG immediate release tablet Take 1 tablet (5 mg total) by mouth every 6 (six) hours as needed. 10 tablet 0   No current facility-administered medications for this visit.    PHYSICAL EXAMINATION: ECOG PERFORMANCE STATUS: 0 - Asymptomatic  Vitals:   05/09/24 1024  BP: 118/68  Pulse: 83  Resp: 18  Temp: 97.9 F (36.6 C)  SpO2: 100%   Filed Weights   05/09/24 1024  Weight: 135 lb 11.2 oz (61.6 kg)    GENERAL:alert, no distress and comfortable   LABORATORY DATA:  I have reviewed the data as listed Lab Results  Component Value Date   WBC 8.5 04/23/2024   HGB 13.9 04/23/2024   HCT 42.0 04/23/2024   MCV 83.3 04/23/2024   PLT 451 (H) 04/23/2024   Lab Results  Component Value Date   NA 139 04/23/2024   K 3.5 04/23/2024   CL 98 04/23/2024   CO2 29 04/23/2024    RADIOGRAPHIC STUDIES: I have personally reviewed the radiological reports and agreed with the findings in the report.  ASSESSMENT AND PLAN:   Assessment and Plan Assessment & Plan Stage IIA triple negative invasive ductal carcinoma of the right breast Recently resected high-grade stage IIA triple negative invasive ductal carcinoma with negative margins and sentinel lymph node. Recommended ACT regimen due to aggressive tumor biology and good health. Prognosis favorable with anticipated cure rate >80%. - Recommended adjuvant ACT regimen (anthracyclines, cyclophosphamide, paclitaxel) for 20 weeks. - Discussed alternative 12-week docetaxel and cyclophosphamide regimen  - Engaged in shared decision making; she agreed to proceed with the 20-week ACT  regimen. - Ordered echocardiogram to assess cardiac function prior to anthracycline-based chemotherapy. - [employer] authorization for chemotherapy. - Arranged port placement for chemotherapy administration. - Scheduled chemotherapy education class for her and support person to review regimen, side effects, and supportive care. - Provided dietary guidance to avoid raw/undercooked meats and to prepare salads at home with thorough washing.  Postoperative care following right  breast lumpectomy Early postoperative period following right breast lumpectomy for invasive ductal carcinoma. Numbness and pruritus in the arm are normal postoperative findings. Margins negative with no residual disease. Awaiting further healing prior to adjuvant therapy. - Advised that numbness and pruritus are expected postoperative symptoms and signs of healing. - Recommended rest and healing for 2-3 weeks, with anticipated clearance for adjuvant therapy after surgical follow-up. - Confirmed follow-up with surgeon (Dr. Ebbie) on January 2nd for postoperative evaluation and anticipated clearance for physical therapy. - Planned referral to physical therapy after surgical clearance. - Discussed possible referral to oncology nutritionist after more urgent issues are addressed.   All questions were answered. The patient knows to call the clinic with any problems, questions or concerns.    Amber Stalls, MD 05/09/2024

## 2024-05-09 NOTE — Progress Notes (Signed)
 START ON PATHWAY REGIMEN - Breast     Cycles 1 through 4: A cycle is every 14 days:     Doxorubicin      Cyclophosphamide      [id number]    Cycles 5 through 16: A cycle is every 7 days:     Paclitaxel   **Always confirm dose/schedule in your pharmacy ordering system**  Patient Characteristics: Postoperative without Neoadjuvant Therapy, M0 (Pathologic Staging), Invasive Disease, Adjuvant Therapy, HER2 Negative, ER Negative, Node Negative, pT1a-c, N46mi or pT1c or Higher, pN0 Therapeutic Status: Postoperative without Neoadjuvant Therapy, M0 (Pathologic Staging) AJCC T Category: pT2 AJCC N Category: pN0 AJCC M Category: cM0 AJCC Grade: G3 ER Status: Negative (-) PR Status: Negative (-) HER2 Status: Negative (-) Oncotype Dx Recurrence Score: Not Appropriate AJCC 8 Stage Grouping: IIA Intent of Therapy: Curative Intent, Discussed with Patient

## 2024-05-09 NOTE — Progress Notes (Signed)
 [facility] CSW Counseling Note  Patient was referred by self. Treatment type: Individual  Presenting Concerns: Patient and/or family reports the following symptoms/concerns: depression and stress Duration of problem: 1 month (since diagnosis) ; Severity of problem: moderate   Orientation:oriented to person, place, time/date, and situation.   Affect: Appropriate, Congruent, and Tearful Risk of harm to self or others: No plan to harm self or others  Patient and/or Family's Strengths/Protective Factors: Ability for insight  Capable of independent living  Communication skills  Motivation for treatment/growth  Religious Affiliation  Supportive family/friends      Goals Addressed: Patient will:  Reduce symptoms of: depression and stress Increase healthy adjustment to current life circumstances Increase adequate support systems for patient   Progress towards Goals: Met   Interventions: Interventions utilized:  Supportive Counseling and Link to [employer]  PHQ 9     04/23/2024   11:47 AM  PHQ9 SCORE ONLY  PHQ-9 Total Score 17       Assessment: Patient reports significantly improved coping since last visit. She has had surgery and is healing, using humor to adjust.  She does feel that she is receiving some more support.  Pt was also approved for [facility] which is a significant financial relief.  Pt opted for PRN follow-up depending on what news she receives from MD about what treatment is needed next.   Plan: Follow up with CSW: PRN Behavioral recommendations:  Referral(s): [facility]; [facility] in January; [facility]- pt sending bill       Denali Becvar E Tahirah Sara, LCSW

## 2024-05-09 NOTE — Telephone Encounter (Addendum)
 As per Dr. Loretha, called patient to relay the below results, patient voiced full understanding and had no further questions at this time.  ----- Message from Amber Loretha, MD sent at 05/09/2024 11:45 AM EST ----- No evidence of UTI

## 2024-05-10 ENCOUNTER — Other Ambulatory Visit: Payer: Self-pay

## 2024-05-10 LAB — URINE CULTURE: Culture: <10000 — AB

## 2024-05-12 ENCOUNTER — Encounter: Payer: Self-pay | Admitting: Hematology and Oncology

## 2024-05-12 NOTE — Progress Notes (Signed)
 Has nerve pain in right arm, not sleeping, will try low dose neurontin

## 2024-05-13 ENCOUNTER — Ambulatory Visit (HOSPITAL_COMMUNITY)
Admission: RE | Admit: 2024-05-13 | Discharge: 2024-05-13 | Disposition: A | Discharge status: Home / Self Care | Source: Ambulatory Visit | Attending: Hematology and Oncology | Admitting: Hematology and Oncology

## 2024-05-13 DIAGNOSIS — M67432 Ganglion, left wrist: Secondary | ICD-10-CM | POA: Diagnosis not present

## 2024-05-13 DIAGNOSIS — Z171 Estrogen receptor negative status [ER-]: Secondary | ICD-10-CM | POA: Insufficient documentation

## 2024-05-13 DIAGNOSIS — Z0189 Encounter for other specified special examinations: Secondary | ICD-10-CM | POA: Diagnosis not present

## 2024-05-13 DIAGNOSIS — C50311 Malignant neoplasm of lower-inner quadrant of right female breast: Secondary | ICD-10-CM | POA: Insufficient documentation

## 2024-05-13 DIAGNOSIS — M25511 Pain in right shoulder: Secondary | ICD-10-CM | POA: Diagnosis not present

## 2024-05-13 DIAGNOSIS — M7702 Medial epicondylitis, left elbow: Secondary | ICD-10-CM | POA: Diagnosis not present

## 2024-05-13 LAB — ECHOCARDIOGRAM COMPLETE
AR max vel: 2.14 cm2
AV Area VTI: 2.02 cm2
AV Area mean vel: 2.03 cm2
AV Mean grad: 7 mmHg
AV Peak grad: 9.5 mmHg
Ao pk vel: 1.54 m/s
Area-P 1/2: 3.19 cm2
S' Lateral: 2.3 cm

## 2024-05-13 NOTE — Progress Notes (Signed)
 " [facility]        Telephone: [phone]?Fax: [phone]   Oncology Clinical Pharmacist Practitioner Initial Assessment   Hannah [name] is a 63 y.o. female with a diagnosis of breast cancer. They were contacted today via in-person visit. She is accompanied by her husband and son.  Indication/Regimen Doxorubicin (Adriamycin) and cyclophosphamide (Cytoxan) followed by paclitaxel (Taxol) are being used appropriately for treatment of breast cancer by Dr. Amber Stalls.      Wt Readings from Last 1 Encounters:  05/09/24 135 lb 11.2 oz (61.6 kg)    Estimated body surface area is 1.61 meters squared as calculated from the following:   Height as of 05/09/24: 5' (1.524 m).   Weight as of 05/09/24: 135 lb 11.2 oz (61.6 kg).  The dosing regimen is every 14 days for 4 cycles  Doxorubicin (60 mg/m2) on Day 1 Cyclophosphamide (600 mg/m2) on Day 1 Pegfilgrastim (6 mg) on Day 3  Followed by a dosing regimen that is every 7 days for 12 cycles  Paclitaxel (80 mg/m2) on Day 1  It is planned to continue until treatment plan completion or unacceptable toxicity. The tentative start date is: 05/29/24  Dose Modifications None   Allergies Allergies[1]  Vitals    05/09/2024   10:24 AM 05/01/2024    9:30 AM 05/01/2024    9:15 AM  Oncology Vitals  Height 152 cm    Weight 61.553 kg    Weight (lbs) 135 lbs 11 oz    BMI 26.5 kg/m2    Temp 97.9 F (36.6 C) 97.5 F (36.4 C)   Pulse Rate 83 67 58  BP 118/68 140/76 130/70  Resp 18 12 10   SpO2 100 % 96 % 96 %  BSA (m2) 1.61 m2       Laboratory Data    Latest Ref Rng & Units 04/23/2024   12:15 PM 11/18/2022    4:47 AM 03/01/2017   11:15 PM  CBC EXTENDED  WBC 4.0 - 10.5 K/uL 8.5  6.5  16.3   RBC 3.87 - 5.11 MIL/uL 5.04  4.68  4.30   Hemoglobin 12.0 - 15.0 g/dL 86.0  86.9  88.1   HCT 36.0 - 46.0 % 42.0  38.9  35.8   Platelets 150 - 400 K/uL 451  341  358   NEUT# 1.7 - 7.7 K/uL 3.7  2.4    Lymph# 0.7 - 4.0  K/uL 3.9  3.0         Latest Ref Rng & Units 04/23/2024   12:15 PM 11/18/2022    4:47 AM 09/02/2019   10:20 AM  CMP  Glucose 70 - 99 mg/dL 860  885  824   BUN 8 - 23 mg/dL 14  16  21    Creatinine 0.44 - 1.00 mg/dL 9.19  9.07  9.03   Sodium 135 - 145 mmol/L 139  134  137   Potassium 3.5 - 5.1 mmol/L 3.5  3.2  4.0   Chloride 98 - 111 mmol/L 98  98  97   CO2 22 - 32 mmol/L 29  26  29    Calcium 8.9 - 10.3 mg/dL 89.5  9.1  9.5   Total Protein 6.5 - 8.1 g/dL 8.3     Total Bilirubin 0.0 - 1.2 mg/dL 0.3     Alkaline Phos 38 - 126 U/L 95     AST 15 - 41 U/L 20     ALT 0 - 44 U/L 18  Contraindications Contraindications were reviewed? Yes Contraindications to therapy were identified? No   Safety Precautions The following safety precautions were reviewed:  Fever: reviewed the importance of having a thermometer and the Centers for Disease Control and Prevention (CDC) definition of fever which is 100.11F (38C) or higher. Patient should call 24/7 triage at [phone] if experiencing a fever or any other symptoms Decreased white blood cells (WBCs) and increased risk for infection Decreased platelet count and increased risk of bleeding Decreased hemoglobin, part of the red blood cells that carry iron and oxygen Change in the color of urine Fatigue Hair loss Nausea or vomiting Mouth irritation or sores Doxorubicin (vesicant) Cardiotoxicity Hemorrhagic cystitis Secondary cancers Muscle or joint pain or weakness Peripheral neuropathy Hypersensitivity reactions Avoid grapefruit products Intimacy, sexual activity, contraception, fertility Handling body fluids and waste  Medication Reconciliation Current Outpatient Medications  Medication Sig Dispense Refill   acetaminophen  (TYLENOL ) 500 MG tablet Take 500-1,000 mg by mouth every 6 (six) hours as needed (pain.).     amLODipine  (NORVASC ) 5 MG tablet Take 5 mg by mouth in the morning.     chlorthalidone (HYGROTON) 25 MG tablet  Take 25 mg by mouth in the morning.     cholecalciferol (VITAMIN D3) 25 MCG (1000 UNIT) tablet Take 1,000 Units by mouth in the morning.     cyanocobalamin (VITAMIN B12) 1000 MCG tablet Take 1,000 mcg by mouth in the morning.     lisinopril  (PRINIVIL ,ZESTRIL ) 40 MG tablet Take 40 mg by mouth in the morning.     metFORMIN  (GLUCOPHAGE ) 500 MG tablet Take 500 mg by mouth 2 (two) times daily with a meal.     oxyCODONE  (OXY IR/ROXICODONE ) 5 MG immediate release tablet Take 1 tablet (5 mg total) by mouth every 6 (six) hours as needed. 10 tablet 0   dexamethasone  (DECADRON ) 4 MG tablet Take 2 tablets (8 mg total) by mouth daily for 3 days. Start the day after doxorubicin/cyclophosphamide chemotherapy. Take with food. 30 tablet 1   lidocaine -prilocaine  (EMLA ) cream Apply to affected area once 30 g 3   ondansetron  (ZOFRAN ) 8 MG tablet Take 1 tab (8 mg) by mouth every 8 hrs as needed for nausea/vomiting. Start third day after doxorubicin/cyclophosphamide chemotherapy. 30 tablet 1   prochlorperazine  (COMPAZINE ) 10 MG tablet Take 1 tablet (10 mg total) by mouth every 6 (six) hours as needed for nausea or vomiting. 30 tablet 1   No current facility-administered medications for this visit.   Medication reconciliation is based on the patient's most recent medication list in the electronic medical record (EMR) including herbal products and OTC medications.   The patient's medication list was reviewed today with the patient? Yes   Drug-drug interactions (DDIs) DDIs were evaluated? Yes Significant DDIs identified? No   Drug-Food Interactions Drug-food interactions were evaluated? Yes Drug-food interactions identified? Avoid grapefruit products  Follow-up Plan  Treatment start date: 05/29/24 Port placement date: 05/31/24 ECHO date: 05/13/24 We reviewed the prescriptions, premedications, and treatment regimen with the patient. Possible side effects of the treatment regimen were reviewed and management  strategies were discussed.  Can use over-the-counter (OTC) options of loperamide (Imodium) as needed for diarrhea, loratadine (Claritin) as needed for G-CSF bone pain, and docusate + senna (Senna-S) as needed for constipation.  Clinical pharmacy will assist Dr. Praveena Iruku and Elbia D Nichol on an as needed basis going forward  Hannah [name] participated in the discussion, expressed understanding, and voiced agreement with the above plan. All questions were answered to  her satisfaction. The patient was advised to contact the clinic at [phone] with any questions or concerns prior to her return visit.   I spent 60 minutes assessing the patient.  Jaysean Manville A. Lucila, PharmD, BCOP, CPP  Norleen DELENA Lucila, RPH-CPP, 05/14/2024 10:00 AM  **Disclaimer: This note was dictated with voice recognition software. Similar sounding words can inadvertently be transcribed and this note may contain transcription errors which may not have been corrected upon publication of note.**      [1]  Allergies Allergen Reactions   Hydrochlorothiazide Other (See Comments)    Reaction:  Muscle spasms   Latex Hives and Itching   "

## 2024-05-14 ENCOUNTER — Encounter: Payer: Self-pay | Admitting: Hematology and Oncology

## 2024-05-14 ENCOUNTER — Inpatient Hospital Stay

## 2024-05-14 ENCOUNTER — Inpatient Hospital Stay: Admitting: Pharmacist

## 2024-05-14 DIAGNOSIS — C50311 Malignant neoplasm of lower-inner quadrant of right female breast: Secondary | ICD-10-CM | POA: Diagnosis not present

## 2024-05-14 MED ORDER — LIDOCAINE-PRILOCAINE 2.5-2.5 % EX CREA: TOPICAL_CREAM | CUTANEOUS | 3 refills | Status: DC

## 2024-05-14 MED ORDER — ONDANSETRON HCL 8 MG PO TABS: ORAL_TABLET | ORAL | 1 refills | Status: AC

## 2024-05-14 MED ORDER — PROCHLORPERAZINE MALEATE 10 MG PO TABS: 10.0000 mg | ORAL_TABLET | Freq: Four times a day (QID) | ORAL | 1 refills | Status: AC | PRN

## 2024-05-14 MED ORDER — DEXAMETHASONE 4 MG PO TABS: ORAL_TABLET | ORAL | 1 refills | Status: AC

## 2024-05-14 NOTE — Progress Notes (Signed)
 Received call from patient after receiving my card per my request.  Introduced myself as Dance Movement Psychotherapist and to ask if she has any financial questions or concerns regarding treatment. Patient states not at this time. Advised should she have any questions or concerns to please feel free to give me a call.  She has my card to do so and for any additional financial questions or concerns.

## 2024-05-19 ENCOUNTER — Inpatient Hospital Stay: Admitting: Licensed Clinical Social Worker

## 2024-05-19 NOTE — Progress Notes (Signed)
 [facility] Psychosocial Distress Screening Clinical Social Work  Hannah [name] is a 63 y.o. year old female. Clinical Social Work was referred by nurse for positive distress screening. The patient scored a 8 on the Psychosocial Distress Thermometer which indicates severe distress. Clinical Social Worker contacted patient by phone to assess for distress and other psychosocial needs.    Distress Screen:    05/14/2024   11:18 AM  ONCBCN DISTRESS SCREENING  Screening Type Initial Screening  How much distress have you been experiencing in the past week? (0-10) 8  Practical concerns type Taking care of myself;Taking care of others  Emotional concerns type Worry or anxiety;Sadness or depression;Loss of interest or enjoyment;Fear;Anger;Feelings of worthlessness or being a burden  Physical Concerns Type  Sleep;Tobacco use     Interventions: Provided brief mental health counseling with regard to adjustment to cancer   CSW and patient have already been working together for emotional support related to cancer diagnosis.  Pt reports being in a better head space today and that she was struggling during the appointment just to wrap her head around everything related to chemo.        Follow Up Plan: CSW will see patient on 1/8 during first infusion and will get car statement from patient for [employer] application Patient verbalizes understanding of plan: Yes    Hannah Turski E Abbegale Stehle, LCSW     Patient is participating in a Managed Medicaid Plan:  Yes

## 2024-05-21 NOTE — Progress Notes (Signed)
 Pharmacist Chemotherapy Monitoring - Initial Assessment    Anticipated start date: 05/29/24   The following has been reviewed per standard work regarding the patient's treatment regimen: The patient's diagnosis, treatment plan and drug doses, and organ/hematologic function Lab orders and baseline tests specific to treatment regimen  The treatment plan start date, drug sequencing, and pre-medications Prior authorization status  Patient's documented medication list, including drug-drug interaction screen and prescriptions for anti-emetics and supportive care specific to the treatment regimen The drug concentrations, fluid compatibility, administration routes, and timing of the medications to be used The patient's access for treatment and lifetime cumulative dose history, if applicable  The patient's medication allergies and previous infusion related reactions, if applicable   Changes made to treatment plan:  N/A  Follow up needed:  Port placement scheduled 05/28/24 Pending authorization for treatment  Hannah [name], RPH, 05/21/2024  4:23 PM

## 2024-05-22 ENCOUNTER — Encounter: Payer: Self-pay | Admitting: Hematology and Oncology

## 2024-05-23 ENCOUNTER — Other Ambulatory Visit: Payer: Self-pay

## 2024-05-23 ENCOUNTER — Encounter (HOSPITAL_BASED_OUTPATIENT_CLINIC_OR_DEPARTMENT_OTHER)
Admission: RE | Admit: 2024-05-23 | Discharge: 2024-05-23 | Disposition: A | Discharge status: Home / Self Care | Source: Ambulatory Visit | Attending: General Surgery | Admitting: General Surgery

## 2024-05-23 ENCOUNTER — Encounter: Payer: Self-pay | Admitting: Hematology and Oncology

## 2024-05-23 ENCOUNTER — Encounter (HOSPITAL_BASED_OUTPATIENT_CLINIC_OR_DEPARTMENT_OTHER): Payer: Self-pay | Admitting: General Surgery

## 2024-05-23 DIAGNOSIS — Z01818 Encounter for other preprocedural examination: Secondary | ICD-10-CM | POA: Insufficient documentation

## 2024-05-23 LAB — BASIC METABOLIC PANEL WITH GFR
Anion gap: 13 (ref 5–15)
BUN: 18 mg/dL (ref 8–23)
CO2: 25 mmol/L (ref 22–32)
Calcium: 9.6 mg/dL (ref 8.9–10.3)
Chloride: 99 mmol/L (ref 98–111)
Creatinine, Ser: 0.68 mg/dL (ref 0.44–1.00)
GFR, Estimated: >60 mL/min
Glucose, Bld: 124 mg/dL — ABNORMAL HIGH (ref 70–99)
Potassium: 3.7 mmol/L (ref 3.5–5.1)
Sodium: 137 mmol/L (ref 135–145)

## 2024-05-27 ENCOUNTER — Other Ambulatory Visit: Payer: Self-pay | Admitting: General Surgery

## 2024-05-28 ENCOUNTER — Encounter (HOSPITAL_BASED_OUTPATIENT_CLINIC_OR_DEPARTMENT_OTHER): Payer: Self-pay | Admitting: General Surgery

## 2024-05-28 ENCOUNTER — Ambulatory Visit (HOSPITAL_BASED_OUTPATIENT_CLINIC_OR_DEPARTMENT_OTHER): Admitting: Anesthesiology

## 2024-05-28 ENCOUNTER — Other Ambulatory Visit: Payer: Self-pay

## 2024-05-28 ENCOUNTER — Ambulatory Visit (HOSPITAL_COMMUNITY)

## 2024-05-28 ENCOUNTER — Ambulatory Visit (HOSPITAL_BASED_OUTPATIENT_CLINIC_OR_DEPARTMENT_OTHER)
Admission: RE | Admit: 2024-05-28 | Discharge: 2024-05-28 | Disposition: A | Discharge status: Home / Self Care | Attending: General Surgery | Admitting: General Surgery

## 2024-05-28 ENCOUNTER — Encounter (HOSPITAL_BASED_OUTPATIENT_CLINIC_OR_DEPARTMENT_OTHER)
Admission: RE | Disposition: A | Discharge status: Home / Self Care | Payer: Self-pay | Source: Home / Self Care | Attending: General Surgery

## 2024-05-28 DIAGNOSIS — Z1732 Human epidermal growth factor receptor 2 negative status: Secondary | ICD-10-CM | POA: Diagnosis not present

## 2024-05-28 DIAGNOSIS — F17210 Nicotine dependence, cigarettes, uncomplicated: Secondary | ICD-10-CM | POA: Diagnosis not present

## 2024-05-28 DIAGNOSIS — E119 Type 2 diabetes mellitus without complications: Secondary | ICD-10-CM

## 2024-05-28 DIAGNOSIS — Z171 Estrogen receptor negative status [ER-]: Secondary | ICD-10-CM | POA: Diagnosis not present

## 2024-05-28 DIAGNOSIS — Z1722 Progesterone receptor negative status: Secondary | ICD-10-CM | POA: Diagnosis not present

## 2024-05-28 DIAGNOSIS — C50911 Malignant neoplasm of unspecified site of right female breast: Secondary | ICD-10-CM | POA: Diagnosis present

## 2024-05-28 DIAGNOSIS — I10 Essential (primary) hypertension: Secondary | ICD-10-CM

## 2024-05-28 HISTORY — PX: PORTACATH PLACEMENT: SHX2246

## 2024-05-28 LAB — GLUCOSE, CAPILLARY
Glucose-Capillary: 169 mg/dL — ABNORMAL HIGH (ref 70–99)
Glucose-Capillary: 164 mg/dL — ABNORMAL HIGH (ref 70–99)

## 2024-05-28 MED ORDER — PROPOFOL 10 MG/ML IV BOLUS
INTRAVENOUS | Status: AC
  Filled 2024-05-28: qty 20

## 2024-05-28 MED ORDER — FENTANYL CITRATE (PF) 100 MCG/2ML IJ SOLN
INTRAMUSCULAR | Status: AC
  Filled 2024-05-28: qty 2

## 2024-05-28 MED ORDER — FENTANYL CITRATE (PF) 100 MCG/2ML IJ SOLN
25.0000 ug | INTRAMUSCULAR | Status: DC | PRN
  Administered 2024-05-28 (×2): 25 ug via INTRAVENOUS

## 2024-05-28 MED ORDER — CHLORHEXIDINE GLUCONATE CLOTH 2 % EX PADS: 6.0000 | MEDICATED_PAD | Freq: Once | CUTANEOUS | Status: DC

## 2024-05-28 MED ORDER — PROPOFOL 500 MG/50ML IV EMUL
INTRAVENOUS | Status: AC
  Filled 2024-05-28: qty 50

## 2024-05-28 MED ORDER — KETOROLAC TROMETHAMINE 30 MG/ML IJ SOLN
INTRAMUSCULAR | Status: AC
  Filled 2024-05-28: qty 1

## 2024-05-28 MED ORDER — LACTATED RINGERS IV SOLN: INTRAVENOUS | Status: DC

## 2024-05-28 MED ORDER — MIDAZOLAM HCL 5 MG/5ML IJ SOLN
INTRAMUSCULAR | Status: DC | PRN
  Administered 2024-05-28: 2 mg via INTRAVENOUS

## 2024-05-28 MED ORDER — PROPOFOL 10 MG/ML IV BOLUS
INTRAVENOUS | Status: DC | PRN
  Administered 2024-05-28: 50 mg via INTRAVENOUS
  Administered 2024-05-28: 150 mg via INTRAVENOUS
  Administered 2024-05-28: 50 mg via INTRAVENOUS

## 2024-05-28 MED ORDER — DEXAMETHASONE SODIUM PHOSPHATE 4 MG/ML IJ SOLN
INTRAMUSCULAR | Status: DC | PRN
  Administered 2024-05-28: 5 mg via INTRAVENOUS

## 2024-05-28 MED ORDER — ACETAMINOPHEN 500 MG PO TABS
ORAL_TABLET | ORAL | Status: AC
  Filled 2024-05-28: qty 2

## 2024-05-28 MED ORDER — CEFAZOLIN SODIUM-DEXTROSE 2-4 GM/100ML-% IV SOLN
2.0000 g | INTRAVENOUS | Status: AC
  Administered 2024-05-28: 2 g via INTRAVENOUS

## 2024-05-28 MED ORDER — ONDANSETRON HCL 4 MG/2ML IJ SOLN: 4.0000 mg | Freq: Once | INTRAMUSCULAR | Status: DC | PRN

## 2024-05-28 MED ORDER — KETOROLAC TROMETHAMINE 15 MG/ML IJ SOLN
15.0000 mg | Freq: Once | INTRAMUSCULAR | Status: AC | PRN
  Administered 2024-05-28: 15 mg via INTRAVENOUS

## 2024-05-28 MED ORDER — CEFAZOLIN SODIUM-DEXTROSE 2-4 GM/100ML-% IV SOLN
INTRAVENOUS | Status: AC
  Filled 2024-05-28: qty 100

## 2024-05-28 MED ORDER — FENTANYL CITRATE (PF) 100 MCG/2ML IJ SOLN
INTRAMUSCULAR | Status: DC | PRN
  Administered 2024-05-28: 50 ug via INTRAVENOUS

## 2024-05-28 MED ORDER — HEPARIN (PORCINE) IN NACL 2-0.9 UNITS/ML
INTRAMUSCULAR | Status: AC | PRN
  Administered 2024-05-28: 1 via INTRAVENOUS

## 2024-05-28 MED ORDER — AMISULPRIDE (ANTIEMETIC) 5 MG/2ML IV SOLN: 10.0000 mg | Freq: Once | INTRAVENOUS | Status: DC | PRN

## 2024-05-28 MED ORDER — ONDANSETRON HCL 4 MG/2ML IJ SOLN
INTRAMUSCULAR | Status: DC | PRN
  Administered 2024-05-28: 4 mg via INTRAVENOUS

## 2024-05-28 MED ORDER — ONDANSETRON HCL 4 MG/2ML IJ SOLN
INTRAMUSCULAR | Status: AC
  Filled 2024-05-28: qty 2

## 2024-05-28 MED ORDER — MIDAZOLAM HCL 2 MG/2ML IJ SOLN
INTRAMUSCULAR | Status: AC
  Filled 2024-05-28: qty 2

## 2024-05-28 MED ORDER — BUPIVACAINE HCL (PF) 0.25 % IJ SOLN
INTRAMUSCULAR | Status: DC | PRN
  Administered 2024-05-28: 7 mL

## 2024-05-28 MED ORDER — HEPARIN SOD (PORK) LOCK FLUSH 100 UNIT/ML IV SOLN
INTRAVENOUS | Status: DC | PRN
  Administered 2024-05-28: 500 [IU] via INTRAVENOUS

## 2024-05-28 MED ORDER — DEXAMETHASONE SOD PHOSPHATE PF 10 MG/ML IJ SOLN
INTRAMUSCULAR | Status: AC
  Filled 2024-05-28: qty 1

## 2024-05-28 MED ORDER — ENSURE PRE-SURGERY PO LIQD: 296.0000 mL | Freq: Once | ORAL | Status: DC

## 2024-05-28 MED ORDER — LIDOCAINE 2% (20 MG/ML) 5 ML SYRINGE
INTRAMUSCULAR | Status: DC | PRN
  Administered 2024-05-28: 40 mg via INTRAVENOUS

## 2024-05-28 MED ORDER — LIDOCAINE 2% (20 MG/ML) 5 ML SYRINGE
INTRAMUSCULAR | Status: AC
  Filled 2024-05-28: qty 5

## 2024-05-28 MED ORDER — ACETAMINOPHEN 500 MG PO TABS
1000.0000 mg | ORAL_TABLET | ORAL | Status: AC
  Administered 2024-05-28: 1000 mg via ORAL

## 2024-05-28 MED FILL — Fosaprepitant Dimeglumine For IV Infusion 150 MG (Base Eq): INTRAVENOUS | Qty: 5 | Status: AC

## 2024-05-28 NOTE — Anesthesia Procedure Notes (Signed)
 Procedure Name: LMA Insertion Date/Time: 05/28/2024 8:38 AM  Performed by: Julieanne Fairy BROCKS, CRNAPre-anesthesia Checklist: Patient identified, Emergency Drugs available, Suction available and Patient being monitored Patient Re-evaluated:Patient Re-evaluated prior to induction Oxygen Delivery Method: Circle system utilized Preoxygenation: Pre-oxygenation with 100% oxygen Induction Type: IV induction Ventilation: Mask ventilation without difficulty LMA: LMA with gastric port inserted LMA Size: 4.0 Number of attempts: 3 Airway Equipment and Method: Bite block Placement Confirmation: positive ETCO2 Tube secured with: Tape Dental Injury: Teeth and Oropharynx as per pre-operative assessment  Comments: Attempted LMA insertion x 3, inserted assist via laryngoscopy, then LMA proseal inserted

## 2024-05-28 NOTE — Discharge Instructions (Addendum)
 "   PORT-A-CATH: POST OP INSTRUCTIONS  Always review your discharge instruction sheet given to you by the facility where your surgery was performed.   A prescription for pain medication may be given to you upon discharge. Take your pain medication as prescribed, if needed. If narcotic pain medicine is not needed, then you make take acetaminophen  (Tylenol ) or ibuprofen  (Advil ) as needed.  Take your usually prescribed medications unless otherwise directed. If you need a refill on your pain medication, please contact our office. All narcotic pain medicine now requires a paper prescription.  Phoned in and fax refills are no longer allowed by law.  Prescriptions will not be filled after 5 pm or on weekends.  You should follow a light diet for the remainder of the day after your procedure. Most patients will experience some mild swelling and/or bruising in the area of the incision. It may take several days to resolve. It is common to experience some constipation if taking pain medication after surgery. Increasing fluid intake and taking a stool softener (such as Colace) will usually help or prevent this problem from occurring. A mild laxative (Milk of Magnesia or Miralax) should be taken according to package directions if there are no bowel movements after 48 hours.  Unless discharge instructions indicate otherwise, you may remove your bandages 48 hours after surgery, and you may shower at that time. You may have steri-strips (small white skin tapes) in place directly over the incision.  These strips should be left on the skin for 7-10 days.  If your surgeon used Dermabond (skin glue) on the incision, you may shower in 24 hours.  The glue will flake off over the next 2-3 weeks.  If your port is left accessed at the end of surgery (needle left in port), the dressing cannot get wet and should only by changed by a healthcare professional. When the port is no longer accessed (when the needle has been removed),  follow step 7.   ACTIVITIES:  Limit activity involving your arms for the next 72 hours. Do no strenuous exercise or activity for 1 week. You may drive when you are no longer taking prescription pain medication, you can comfortably wear a seatbelt, and you can maneuver your car. 10.You may need to see your doctor in the office for a follow-up appointment.  Please       check with your doctor.  11.When you receive a new Port-a-Cath, you will get a product guide and        ID card.  Please keep them in case you need them.  WHEN TO CALL YOUR DOCTOR [phone]): Fever over 101.0 Chills Continued bleeding from incision Increased redness and tenderness at the site Shortness of breath, difficulty breathing   The clinic staff is available to answer your questions during regular business hours. Please dont hesitate to call and ask to speak to one of the nurses or medical assistants for clinical concerns. If you have a medical emergency, go to the nearest emergency room or call 911.  A surgeon from [facility] is always on call at the hospital.     For further information, please visit [email or web address]     Post Anesthesia Home Care Instructions  Activity: Get plenty of rest for the remainder of the day. A responsible individual must stay with you for 24 hours following the procedure.  For the next 24 hours, DO NOT: -Drive a car -Advertising copywriter -Drink alcoholic beverages -Take any medication unless instructed by  your physician -Make any legal decisions or sign important papers.  Meals: Start with liquid foods such as gelatin or soup. Progress to regular foods as tolerated. Avoid greasy, spicy, heavy foods. If nausea and/or vomiting occur, drink only clear liquids until the nausea and/or vomiting subsides. Call your physician if vomiting continues.  Special Instructions/Symptoms: Your throat may feel dry or sore from the anesthesia or the breathing tube  placed in your throat during surgery. If this causes discomfort, gargle with warm salt water. The discomfort should disappear within 24 hours.  If you had a scopolamine patch placed behind your ear for the management of post- operative nausea and/or vomiting:  1. The medication in the patch is effective for 72 hours, after which it should be removed.  Wrap patch in a tissue and discard in the trash. Wash hands thoroughly with soap and water. 2. You may remove the patch earlier than 72 hours if you experience unpleasant side effects which may include dry mouth, dizziness or visual disturbances. 3. Avoid touching the patch. Wash your hands with soap and water after contact with the patch.    *May have Tylenol  today at 1:30pm* "

## 2024-05-28 NOTE — Op Note (Signed)
 Preoperative diagnosis: TNBC Postoperative diagnosis: Same as above Procedure: Right IJ port placement Surgeon: Dr. Adina Bury Anesthesia: General Estimated blood loss: Minimal Complications: Drains:none Specimens: None Disposition recovery stable   Indications: 64 year old female is undergone a lumpectomy and a sentinel lymph node biopsy. She is doing well. She has a tiny amount of drainage from the breast incision that just darted today. She has a 2.2 cm grade 3 invasive ductal carcinoma that previously was a triple negative tumor. Her lymph node was negative. Her margins are clear. We discussed port placement.    Procedure: After informed consent was obtained she was taken to the operating room.  She was given antibiotic she was placed under anesthesia without complication.  She was prepped and draped in standard sterile surgical fashion.  Surgical timeout was then performed.   I was able under ultrasound guidance to access the right internal jugular vein easily.  I confirmed this with the ultrasound.   I passed the wire and this was in good position with fluoroscopy. I also confirmed the wire was in the vein with ultrasound.  I then made an incision created a pocket below this.  I tunneled the line between the 2 sites.  I then placed the dilator over the wire under fluoroscopy.  I removed the wire assembly.  The line was then placed through the sheath.  I then removed the peel-away sheath.  The line was pulled back to be in the distal cava.  The line is ready for use now. There was no ectopy present.  I then attached the port and sutured this into the position with a 2-0 Prolene suture.  The port flushed easily and aspirated blood.  I placed concentrated heparin  in it.  I then did 1 final C arm image and this was all in good position.  I then closed this with 3-0 Vicryl and 4-0 Monocryl.  I accessed the port for chemotherapy tomorrow. Glue and a Steri-Strip were placed.  She tolerated this  well and was transferred to recovery stable.

## 2024-05-28 NOTE — Anesthesia Preprocedure Evaluation (Addendum)
"                                    Anesthesia Evaluation  Patient identified by MRN, date of birth, ID band Patient awake    Reviewed: Allergy & Precautions, NPO status , Patient's Chart, lab work & pertinent test results  Airway Mallampati: II  TM Distance: >3 FB Neck ROM: Full    Dental  (+) Missing,    Pulmonary Current Smoker and Patient abstained from smoking.   Pulmonary exam normal        Cardiovascular hypertension, Pt. on medications Normal cardiovascular exam     Neuro/Psych negative neurological ROS  negative psych ROS   GI/Hepatic negative GI ROS, Neg liver ROS,,,  Endo/Other  diabetes, Oral Hypoglycemic Agents    Renal/GU negative Renal ROS     Musculoskeletal   Abdominal   Peds  Hematology negative hematology ROS (+)   Anesthesia Other Findings  BREAST CANCER  Reproductive/Obstetrics                              Anesthesia Physical Anesthesia Plan  ASA: 3  Anesthesia Plan: General   Post-op Pain Management:    Induction: Intravenous  PONV Risk Score and Plan: 2 and Ondansetron , Dexamethasone , Midazolam  and Treatment may vary due to age or medical condition  Airway Management Planned: LMA  Additional Equipment:   Intra-op Plan:   Post-operative Plan: Extubation in OR  Informed Consent: I have reviewed the patients History and Physical, chart, labs and discussed the procedure including the risks, benefits and alternatives for the proposed anesthesia with the patient or authorized representative who has indicated his/her understanding and acceptance.     Dental advisory given  Plan Discussed with: CRNA  Anesthesia Plan Comments:          Anesthesia Quick Evaluation  "

## 2024-05-28 NOTE — Interval H&P Note (Signed)
 History and Physical Interval Note:  05/28/2024 8:26 AM  Hannah [name]  has presented today for surgery, with the diagnosis of BREAST CANCER.  The various methods of treatment have been discussed with the patient and family. After consideration of risks, benefits and other options for treatment, the patient has consented to  Procedures with comments: INSERTION, TUNNELED CENTRAL VENOUS DEVICE, WITH PORT (N/A) - PORT PLACEMENT WITH ULTRASOUND GUIDANCE as a surgical intervention.  The patient's history has been reviewed, patient examined, no change in status, stable for surgery.  I have reviewed the patient's chart and labs.  Questions were answered to the patient's satisfaction.     Donnice Bury

## 2024-05-28 NOTE — Anesthesia Postprocedure Evaluation (Signed)
"   Anesthesia Post Note  Patient: Hannah [name]  Procedure(s) Performed: INSERTION, TUNNELED CENTRAL VENOUS DEVICE, WITH PORT (Right: Chest)     Patient location during evaluation: PACU Anesthesia Type: General Level of consciousness: awake Pain management: pain level controlled Vital Signs Assessment: post-procedure vital signs reviewed and stable Respiratory status: spontaneous breathing, nonlabored ventilation and respiratory function stable Cardiovascular status: blood pressure returned to baseline and stable Postop Assessment: no apparent nausea or vomiting Anesthetic complications: no   No notable events documented.  Last Vitals:  Vitals:   05/28/24 1015 05/28/24 1024  BP: 123/72 129/72  Pulse: 74 75  Resp: 15 14  Temp:  (!) 36.3 C  SpO2: 93% 93%    Last Pain:  Vitals:   05/28/24 1024  TempSrc:   PainSc: 3                  Christalyn Goertz P Giovany Cosby      "

## 2024-05-28 NOTE — Transfer of Care (Signed)
 Immediate Anesthesia Transfer of Care Note  Patient: Hannah [name]  Procedure(s) Performed: INSERTION, TUNNELED CENTRAL VENOUS DEVICE, WITH PORT (Right: Chest)  Patient Location: PACU  Anesthesia Type:General  Level of Consciousness: sedated  Airway & Oxygen Therapy: Patient Spontanous Breathing and Patient connected to face mask oxygen  Post-op Assessment: Report given to RN and Post -op Vital signs reviewed and stable  Post vital signs: Reviewed and stable  Last Vitals:  Vitals Value Taken Time  BP 134/83 05/28/24 09:23  Temp    Pulse 90 05/28/24 09:27  Resp 15 05/28/24 09:27  SpO2 98 % 05/28/24 09:27  Vitals shown include unfiled device data.  Last Pain:  Vitals:   05/28/24 0720  TempSrc: Temporal  PainSc: 0-No pain         Complications: No notable events documented.

## 2024-05-28 NOTE — H&P (Signed)
 Hannah [name] is an 64 y.o. female.   Chief Complaint: breast cancer HPI: 64 year old female is undergone a lumpectomy and a sentinel lymph node biopsy. She is doing well. She has a tiny amount of drainage from the breast incision that just darted today. She has a 2.2 cm grade 3 invasive ductal carcinoma that previously was a triple negative tumor. Her lymph node was negative. Her margins are clear. She is seeing oncology and is due to begin chemotherapy next week.   Past Medical History:  Diagnosis Date   Arthritis    feet   Cancer (HCC)    Breast   Diabetes mellitus    type 2   Dysrhythmia    Family history of adverse reaction to anesthesia    father: post-op nausea and vomiting   Hypertension    Osteopenia    Wears glasses    Wears partial dentures    partial upper    Past Surgical History:  Procedure Laterality Date   ABDOMINAL HYSTERECTOMY     in her 30s   BREAST BIOPSY Right    BREAST SURGERY  2 different occasions, over 5 years ago   cyst removal BIL   CHOLECYSTECTOMY N/A 12/25/2016   Procedure: LYSIS OF ADHESIONS, LAPAROSCOPIC CHOLECYSTECTOMY WITH INTRAOPERATIVE CHOLANGIOGRAM;  Surgeon: Tanda Locus, MD;  Location: [facility];  Service: General;  Laterality: N/A;   COLON SURGERY  at least 6 yrs ago per patient   polyps and fissures    DIAGNOSTIC LAPAROSCOPY  over 20 years ago   exploratory lap   FOOT SURGERY  05/23/1983   left   INSERTION OF MESH N/A 11/13/2014   Procedure: INSERTION OF MESH;  Surgeon: Lynda Leos, MD;  [facility];  Service: General;  Laterality: N/A;   MICROLARYNGOSCOPY N/A 05/19/2014   Procedure: MICROLARYNGOSCOPY WITH EXCISION OF VOCAL CORD LESION;  Surgeon: Lonni FORBES Angle, MD;  Location: [facility];  Service: ENT;  Laterality: N/A;   MICROLARYNGOSCOPY WITH CO2 LASER AND EXCISION OF VOCAL CORD LESION N/A 09/05/2019   Procedure: MICROLARYNGOSCOPY WITH EXCISION OF VOCAL CORD LESION;  Surgeon: Angle Lonni FORBES, MD;   Location: [facility];  Service: ENT;  Laterality: N/A;   rectal fissure     SENTINEL NODE BIOPSY Right 05/01/2024   Procedure: BIOPSY, LYMPH NODE, SENTINEL;  Surgeon: Ebbie Cough, MD;  Location: [facility] OR;  Service: General;  Laterality: Right;   SHOULDER SURGERY Right    UMBILICAL HERNIA REPAIR N/A 11/13/2014   Procedure: LAPAROSCOPIC UMBILICAL HERNIA REPAIR WITH MESH;  Surgeon: Lynda Leos, MD;  [facility];  Service: General;  Laterality: N/A;    Family History  Problem Relation Age of Onset   Cancer Maternal Cousin        blood cancer   Social History:  reports that she has been smoking cigarettes. She started smoking about 55 years ago. She has a 41.3 pack-year smoking history. She has never used smokeless tobacco. She reports that she does not drink alcohol and does not use drugs.  Allergies: Allergies[1]  Medications Prior to Admission  Medication Sig Dispense Refill   amLODipine  (NORVASC ) 5 MG tablet Take 5 mg by mouth in the morning.     chlorthalidone (HYGROTON) 25 MG tablet Take 25 mg by mouth in the morning.     cholecalciferol (VITAMIN D3) 25 MCG (1000 UNIT) tablet Take 1,000 Units by mouth in the morning.     cyanocobalamin (VITAMIN B12) 1000 MCG tablet Take 1,000 mcg by  mouth in the morning.     lisinopril  (PRINIVIL ,ZESTRIL ) 40 MG tablet Take 40 mg by mouth in the morning.     metFORMIN  (GLUCOPHAGE ) 500 MG tablet Take 500 mg by mouth 2 (two) times daily with a meal.     acetaminophen  (TYLENOL ) 500 MG tablet Take 500-1,000 mg by mouth every 6 (six) hours as needed (pain.).     dexamethasone  (DECADRON ) 4 MG tablet Take 2 tablets (8 mg total) by mouth daily for 3 days. Start the day after doxorubicin/cyclophosphamide chemotherapy. Take with food. 30 tablet 1   lidocaine -prilocaine  (EMLA ) cream Apply to affected area once 30 g 3   ondansetron  (ZOFRAN ) 8 MG tablet Take 1 tab (8 mg) by mouth every 8 hrs as needed for nausea/vomiting. Start third day  after doxorubicin/cyclophosphamide chemotherapy. 30 tablet 1   oxyCODONE  (OXY IR/ROXICODONE ) 5 MG immediate release tablet Take 1 tablet (5 mg total) by mouth every 6 (six) hours as needed. 10 tablet 0   prochlorperazine  (COMPAZINE ) 10 MG tablet Take 1 tablet (10 mg total) by mouth every 6 (six) hours as needed for nausea or vomiting. 30 tablet 1    Results for orders placed or performed during the hospital encounter of 05/28/24 (from the past 48 hours)  Glucose, capillary     Status: Abnormal   Collection Time: 05/28/24  7:39 AM  Result Value Ref Range   Glucose-Capillary 169 (H) 70 - 99 mg/dL    Comment: Glucose reference range applies only to samples taken after fasting for at least 8 hours.   Comment 1 Notify RN    Comment 2 Document in Chart    No results found.  Review of Systems  All other systems reviewed and are negative.   Blood pressure (!) 111/57, pulse 84, temperature (!) 97.5 F (36.4 C), temperature source Temporal, resp. rate 16, height 5' (1.524 m), weight 61.1 kg, SpO2 99%. Physical Exam    Axillary incision is healing well without any evidence of infection Breast incision is healing well without any evidence of infection there is a small very small medial superficial opening  Assessment/Plan Port placement  Donnice Bury, MD 05/28/2024, 8:02 AM       [1]  Allergies Allergen Reactions   Hydrochlorothiazide Other (See Comments)    Reaction:  Muscle spasms   Latex Hives and Itching

## 2024-05-29 ENCOUNTER — Other Ambulatory Visit (HOSPITAL_COMMUNITY): Payer: Self-pay

## 2024-05-29 ENCOUNTER — Encounter (HOSPITAL_BASED_OUTPATIENT_CLINIC_OR_DEPARTMENT_OTHER): Payer: Self-pay | Admitting: General Surgery

## 2024-05-29 ENCOUNTER — Inpatient Hospital Stay: Attending: Hematology and Oncology

## 2024-05-29 ENCOUNTER — Encounter: Payer: Self-pay | Admitting: Hematology and Oncology

## 2024-05-29 ENCOUNTER — Inpatient Hospital Stay: Admitting: Licensed Clinical Social Worker

## 2024-05-29 ENCOUNTER — Inpatient Hospital Stay

## 2024-05-29 ENCOUNTER — Inpatient Hospital Stay: Attending: Hematology and Oncology | Admitting: Hematology and Oncology

## 2024-05-29 VITALS — BP 113/65 | HR 71 | Temp 98.0°F | Resp 20

## 2024-05-29 VITALS — BP 127/55 | HR 87 | Temp 98.1°F | Resp 17 | Wt 139.1 lb

## 2024-05-29 DIAGNOSIS — F17210 Nicotine dependence, cigarettes, uncomplicated: Secondary | ICD-10-CM | POA: Insufficient documentation

## 2024-05-29 DIAGNOSIS — R630 Anorexia: Secondary | ICD-10-CM | POA: Diagnosis not present

## 2024-05-29 DIAGNOSIS — Z5189 Encounter for other specified aftercare: Secondary | ICD-10-CM | POA: Insufficient documentation

## 2024-05-29 DIAGNOSIS — C50311 Malignant neoplasm of lower-inner quadrant of right female breast: Secondary | ICD-10-CM | POA: Diagnosis not present

## 2024-05-29 DIAGNOSIS — E119 Type 2 diabetes mellitus without complications: Secondary | ICD-10-CM | POA: Diagnosis not present

## 2024-05-29 DIAGNOSIS — E860 Dehydration: Secondary | ICD-10-CM | POA: Diagnosis not present

## 2024-05-29 DIAGNOSIS — Z809 Family history of malignant neoplasm, unspecified: Secondary | ICD-10-CM | POA: Diagnosis not present

## 2024-05-29 DIAGNOSIS — Z5111 Encounter for antineoplastic chemotherapy: Secondary | ICD-10-CM | POA: Insufficient documentation

## 2024-05-29 DIAGNOSIS — R110 Nausea: Secondary | ICD-10-CM | POA: Insufficient documentation

## 2024-05-29 DIAGNOSIS — Z171 Estrogen receptor negative status [ER-]: Secondary | ICD-10-CM | POA: Diagnosis not present

## 2024-05-29 LAB — CBC WITH DIFFERENTIAL (CANCER CENTER ONLY)
Abs Immature Granulocytes: 0.04 K/uL (ref 0.00–0.07)
Basophils Absolute: 0.1 K/uL (ref 0.0–0.1)
Basophils Relative: 1 %
Eosinophils Absolute: 0.2 K/uL (ref 0.0–0.5)
Eosinophils Relative: 2 %
HCT: 32.6 % — ABNORMAL LOW (ref 36.0–46.0)
Hemoglobin: 10.9 g/dL — ABNORMAL LOW (ref 12.0–15.0)
Immature Granulocytes: 0 %
Lymphocytes Relative: 40 %
Lymphs Abs: 4.8 K/uL — ABNORMAL HIGH (ref 0.7–4.0)
MCH: 27.7 pg (ref 26.0–34.0)
MCHC: 33.4 g/dL (ref 30.0–36.0)
MCV: 83 fL (ref 80.0–100.0)
Monocytes Absolute: 0.8 K/uL (ref 0.1–1.0)
Monocytes Relative: 6 %
Neutro Abs: 6.3 K/uL (ref 1.7–7.7)
Neutrophils Relative %: 51 %
Platelet Count: 398 K/uL (ref 150–400)
RBC: 3.93 MIL/uL (ref 3.87–5.11)
RDW: 14.3 % (ref 11.5–15.5)
Smear Review: NORMAL
WBC Count: 12.3 K/uL — ABNORMAL HIGH (ref 4.0–10.5)
nRBC: 0 % (ref 0.0–0.2)

## 2024-05-29 LAB — CMP (CANCER CENTER ONLY)
ALT: 15 U/L (ref 0–44)
AST: 20 U/L (ref 15–41)
Albumin: 4.3 g/dL (ref 3.5–5.0)
Alkaline Phosphatase: 73 U/L (ref 38–126)
Anion gap: 10 (ref 5–15)
BUN: 21 mg/dL (ref 8–23)
CO2: 29 mmol/L (ref 22–32)
Calcium: 9.7 mg/dL (ref 8.9–10.3)
Chloride: 102 mmol/L (ref 98–111)
Creatinine: 0.79 mg/dL (ref 0.44–1.00)
GFR, Estimated: >60 mL/min
Glucose, Bld: 139 mg/dL — ABNORMAL HIGH (ref 70–99)
Potassium: 3.4 mmol/L — ABNORMAL LOW (ref 3.5–5.1)
Sodium: 141 mmol/L (ref 135–145)
Total Bilirubin: 0.3 mg/dL (ref 0.0–1.2)
Total Protein: 6.9 g/dL (ref 6.5–8.1)

## 2024-05-29 MED ORDER — DOXORUBICIN HCL CHEMO IV INJECTION 2 MG/ML
60.0000 mg/m2 | Freq: Once | INTRAVENOUS | Status: AC
  Administered 2024-05-29: 96 mg via INTRAVENOUS
  Filled 2024-05-29: qty 48

## 2024-05-29 MED ORDER — SODIUM CHLORIDE 0.9 % IV SOLN: INTRAVENOUS | Status: DC

## 2024-05-29 MED ORDER — DEXAMETHASONE SOD PHOSPHATE PF 10 MG/ML IJ SOLN
10.0000 mg | Freq: Once | INTRAMUSCULAR | Status: AC
  Administered 2024-05-29: 10 mg via INTRAVENOUS

## 2024-05-29 MED ORDER — LIDOCAINE VISCOUS HCL 2 % MT SOLN
5.0000 mL | Freq: Three times a day (TID) | OROMUCOSAL | 0 refills | Status: AC | PRN
  Filled 2024-05-29: qty 240, 14d supply, fill #0

## 2024-05-29 MED ORDER — SODIUM CHLORIDE 0.9 % IV SOLN
150.0000 mg | Freq: Once | INTRAVENOUS | Status: AC
  Administered 2024-05-29: 150 mg via INTRAVENOUS
  Filled 2024-05-29: qty 150

## 2024-05-29 MED ORDER — PALONOSETRON HCL INJECTION 0.25 MG/5ML
0.2500 mg | Freq: Once | INTRAVENOUS | Status: AC
  Administered 2024-05-29: 0.25 mg via INTRAVENOUS
  Filled 2024-05-29: qty 5

## 2024-05-29 MED ORDER — SODIUM CHLORIDE 0.9 % IV SOLN
600.0000 mg/m2 | Freq: Once | INTRAVENOUS | Status: AC
  Administered 2024-05-29: 1000 mg via INTRAVENOUS
  Filled 2024-05-29: qty 50

## 2024-05-29 NOTE — Progress Notes (Signed)
 [facility] CSW Progress Note  Visual Merchandiser met with patient to follow-up on need for community resources and emotional support. Pt present for her first infusion. Her son, Ozell, is with her.     Interventions: Completed applications for [employer] Purse and for Devere KANDICE Grout   Patient reports doing well otherwise. She has processed more since chemo ed class and feels she is managing well emotionally. 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      Follow Up Plan:  Patient will contact CSW with any support or resource needs    Alina Gilkey E Markevious Ehmke, LCSW Clinical Social Worker [facility]    Patient is participating in a Managed Medicaid Plan:  Yes

## 2024-05-29 NOTE — Progress Notes (Signed)
  Cancer Center CONSULT NOTE  Patient Care Team: Benjamine Aland, MD as PCP - General (Family Medicine) Tyree Nanetta SAILOR, RN as Oncology Nurse Navigator Ebbie Cough, MD as Consulting Physician (General Surgery) Loretha Ash, MD as Consulting Physician (Hematology and Oncology) Dewey Rush, MD as Consulting Physician (Radiation Oncology)  CHIEF COMPLAINTS/PURPOSE OF CONSULTATION:  Newly diagnosed breast cancer  HISTORY OF PRESENTING ILLNESS:  Hannah [name] 64 y.o. female is here because of recent diagnosis of right breast cancer  I reviewed her records extensively and collaborated the history with the patient.  SUMMARY OF ONCOLOGIC HISTORY: Oncology History  Malignant neoplasm of lower-inner quadrant of right breast of female, estrogen receptor negative (HCC)  04/18/2024 Initial Diagnosis   Malignant neoplasm of lower-inner quadrant of right breast of female, estrogen receptor negative (HCC)   05/01/2024 Genetic Testing   Negative [facility] + RNA insight. Report date 05/01/2024.  [facility] + RNAinsight gene panel which includes sequencing, rearrangement analysis, and RNA analysis for the following 40 genes: APC, ATM, BAP1, BARD1, BMPR1A, BRCA1, BRCA2, BRIP1, CDH1, CDKN2A, CHEK2, FH, FLCN, MET, MLH1, MSH2, MSH6, MUTYH, NF1, NTHL1, PALB2, PMS2, PTEN, RAD51C, RAD51D, RPS20, SMAD4, STK11, TP53, TSC1, TSC2, and VHL (sequencing and deletion/duplication); AXIN2, HOXB13, MBD4, MSH3, POLD1 and POLE (sequencing only); EPCAM and GREM1 (deletion/duplication only).    05/29/2024 -  Chemotherapy   Patient is on Treatment Plan : BREAST DOSE DENSE AC q14d / PACLitaxel q7d      Discussed the use of AI scribe software for clinical note transcription with the patient, who gave verbal consent to proceed.  History of Present Illness  Hannah [name] is a 64 year old female with breast cancer who presents for oncology follow-up and initiation of chemotherapy.  She  reports persistent right breast wound drainage characterized by yellowish, occasionally malodorous purulent discharge requiring gauze changes three to four times daily  This morning, she noted new-onset bleeding from the right breast, which soaked her clothing overnight. After bathing and applying fresh gauze, she currently observes only minimal blood on the dressing. She continues to experience localized tenderness, intermittent shooting pains, and significant ecchymosis at the site. She denies fever, chills, or increased warmth at the wound and is monitoring for signs of infection. She denies any current foul odor from the wound.  She reports the port is functioning appropriately but remains sore, which she attributes to repeated procedures on the right side.  She is accompanied by her son.  Rest of the pertinent 10 point ROS reviewed and neg.  MEDICAL HISTORY:  Past Medical History:  Diagnosis Date   Arthritis    feet   Cancer (HCC)    Breast   Diabetes mellitus    type 2   Dysrhythmia    Family history of adverse reaction to anesthesia    father: post-op nausea and vomiting   Hypertension    Osteopenia    Wears glasses    Wears partial dentures    partial upper    SURGICAL HISTORY: Past Surgical History:  Procedure Laterality Date   ABDOMINAL HYSTERECTOMY     in her 30s   BREAST BIOPSY Right    BREAST SURGERY  2 different occasions, over 5 years ago   cyst removal BIL   CHOLECYSTECTOMY N/A 12/25/2016   Procedure: LYSIS OF ADHESIONS, LAPAROSCOPIC CHOLECYSTECTOMY WITH INTRAOPERATIVE CHOLANGIOGRAM;  Surgeon: Tanda Locus, MD;  [facility]: [facility];  Service: General;  Laterality: N/A;   COLON SURGERY  at least 6 yrs ago per patient  polyps and fissures    DIAGNOSTIC LAPAROSCOPY  over 20 years ago   exploratory lap   FOOT SURGERY  05/23/1983   left   INSERTION OF MESH N/A 11/13/2014   Procedure: INSERTION OF MESH;  Surgeon: Lynda Leos, MD;  [facility];  Service:  General;  Laterality: N/A;   MICROLARYNGOSCOPY N/A 05/19/2014   Procedure: MICROLARYNGOSCOPY WITH EXCISION OF VOCAL CORD LESION;  Surgeon: Lonni FORBES Angle, MD;  Location: [facility];  Service: ENT;  Laterality: N/A;   MICROLARYNGOSCOPY WITH CO2 LASER AND EXCISION OF VOCAL CORD LESION N/A 09/05/2019   Procedure: MICROLARYNGOSCOPY WITH EXCISION OF VOCAL CORD LESION;  Surgeon: Angle Lonni FORBES, MD;  Location: [facility];  Service: ENT;  Laterality: N/A;   rectal fissure     SENTINEL NODE BIOPSY Right 05/01/2024   Procedure: BIOPSY, LYMPH NODE, SENTINEL;  Surgeon: Ebbie Cough, MD;  Location: [facility] OR;  Service: General;  Laterality: Right;   SHOULDER SURGERY Right    UMBILICAL HERNIA REPAIR N/A 11/13/2014   Procedure: LAPAROSCOPIC UMBILICAL HERNIA REPAIR WITH MESH;  Surgeon: Lynda Leos, MD;  [facility];  Service: General;  Laterality: N/A;    SOCIAL HISTORY: Social History   Socioeconomic History   Marital status: Married    Spouse name: Not on file   Number of children: Not on file   Years of education: Not on file   Highest education level: Not on file  Occupational History   Not on file  Tobacco Use   Smoking status: Every Day    Current packs/day: 0.75    Average packs/day: 0.8 packs/day for 55.0 years (41.3 ttl pk-yrs)    Types: Cigarettes    Start date: 23   Smokeless tobacco: Never   Tobacco comments:    As of 04/28/24: cut back to 1/2 pack a day from 1 pack a day  Vaping Use   Vaping status: Never Used  Substance and Sexual Activity   Alcohol use: No   Drug use: No   Sexual activity: Yes  Other Topics Concern   Not on file  Social History Narrative   Not on file   Social Drivers of Health   Tobacco Use: High Risk (05/28/2024)   Patient History    Smoking Tobacco Use: Every Day    Smokeless Tobacco Use: Never    Passive Exposure: Not on file  Financial Resource Strain: Not on file  Food Insecurity: Food  Insecurity Present (04/23/2024)   Epic    Worried About Radiation Protection Practitioner of Food in the Last Year: Sometimes true    [employer] of Food in the Last Year: Sometimes true  Transportation Needs: No Transportation Needs (04/23/2024)   Epic    Lack of Transportation (Medical): No    Lack of Transportation (Non-Medical): No  Physical Activity: Not on file  Stress: Not on file  Social Connections: Not on file  Intimate Partner Violence: Not At Risk (04/23/2024)   Epic    Fear of Current or Ex-Partner: No    Emotionally Abused: No    Physically Abused: No    Sexually Abused: No  Depression (PHQ2-9): Low Risk (05/29/2024)   Depression (PHQ2-9)    PHQ-2 Score: 0  Recent Concern: Depression (PHQ2-9) - High Risk (04/23/2024)   Depression (PHQ2-9)    PHQ-2 Score: 17  Alcohol Screen: Not on file  Housing: Low Risk (04/23/2024)   Epic    Unable to Pay for Housing in the Last Year: No  Number of Times Moved in the Last Year: 0    Homeless in the Last Year: No  Utilities: Not At Risk (04/23/2024)   Epic    Threatened with loss of utilities: No  Health Literacy: Not on file    FAMILY HISTORY: Family History  Problem Relation Age of Onset   Cancer Maternal Cousin        blood cancer    ALLERGIES:  is allergic to hydrochlorothiazide and latex.  MEDICATIONS:  Current Outpatient Medications  Medication Sig Dispense Refill   acetaminophen  (TYLENOL ) 500 MG tablet Take 500-1,000 mg by mouth every 6 (six) hours as needed (pain.).     amLODipine  (NORVASC ) 5 MG tablet Take 5 mg by mouth in the morning.     chlorthalidone (HYGROTON) 25 MG tablet Take 25 mg by mouth in the morning.     cholecalciferol (VITAMIN D3) 25 MCG (1000 UNIT) tablet Take 1,000 Units by mouth in the morning.     cyanocobalamin (VITAMIN B12) 1000 MCG tablet Take 1,000 mcg by mouth in the morning.     dexamethasone  (DECADRON ) 4 MG tablet Take 2 tablets (8 mg total) by mouth daily for 3 days. Start the day after  doxorubicin /cyclophosphamide  chemotherapy. Take with food. 30 tablet 1   lidocaine -prilocaine  (EMLA ) cream Apply to affected area once 30 g 3   lisinopril  (PRINIVIL ,ZESTRIL ) 40 MG tablet Take 40 mg by mouth in the morning.     metFORMIN  (GLUCOPHAGE ) 500 MG tablet Take 500 mg by mouth 2 (two) times daily with a meal.     ondansetron  (ZOFRAN ) 8 MG tablet Take 1 tab (8 mg) by mouth every 8 hrs as needed for nausea/vomiting. Start third day after doxorubicin /cyclophosphamide  chemotherapy. 30 tablet 1   oxyCODONE  (OXY IR/ROXICODONE ) 5 MG immediate release tablet Take 1 tablet (5 mg total) by mouth every 6 (six) hours as needed. 10 tablet 0   prochlorperazine  (COMPAZINE ) 10 MG tablet Take 1 tablet (10 mg total) by mouth every 6 (six) hours as needed for nausea or vomiting. 30 tablet 1   No current facility-administered medications for this visit.    PHYSICAL EXAMINATION: ECOG PERFORMANCE STATUS: 0 - Asymptomatic  Vitals:   05/29/24 1127  BP: (!) 127/55  Pulse: 87  Resp: 17  Temp: 98.1 F (36.7 C)  SpO2: 100%   Filed Weights   05/29/24 1127  Weight: 139 lb 1.6 oz (63.1 kg)    GENERAL:alert, no distress and comfortable Port site appears well In the right breast, minimal bleeding noted at steristrips No warmth redness or foul smelling drainage suggestive of an infection.  LABORATORY DATA:  I have reviewed the data as listed Lab Results  Component Value Date   WBC 12.3 (H) 05/29/2024   HGB 10.9 (L) 05/29/2024   HCT 32.6 (L) 05/29/2024   MCV 83.0 05/29/2024   PLT 398 05/29/2024   Lab Results  Component Value Date   NA 141 05/29/2024   K 3.4 (L) 05/29/2024   CL 102 05/29/2024   CO2 29 05/29/2024    RADIOGRAPHIC STUDIES: I have personally reviewed the radiological reports and agreed with the findings in the report.  ASSESSMENT AND PLAN:   Assessment and Plan Assessment & Plan Stage IIA triple negative invasive ductal carcinoma of the right breast Recently resected  high-grade stage IIA triple negative invasive ductal carcinoma with negative margins and sentinel lymph node. Recommended ACT regimen due to aggressive tumor biology and good health. Prognosis favorable with anticipated cure rate >80%. - Recommended  adjuvant ACT regimen (anthracyclines, cyclophosphamide , paclitaxel) for 20 weeks. -Scheduled to initiate chemotherapy today. No contraindications. Port in place with postoperative soreness, no infection or complications. Clinically stable, understood treatment plan. - Administer scheduled chemotherapy infusion today. - Instruct to report fever or other concerning symptoms.  Postoperative breast wound with drainage Postoperative breast wound with purulent drainage and fresh bleeding. Ecchymotic and tender, no warmth, odor, or systemic symptoms. Slightly elevated WBC. Hemostasis achieved. No concern for infection. - Monitor for signs of infection or increased bleeding.    All questions were answered. The patient knows to call the clinic with any problems, questions or concerns.    Amber Stalls, MD 05/29/2024

## 2024-05-29 NOTE — Patient Instructions (Signed)
 [facility] - A DEPT OF [facility]  Discharge Instructions: Thank you for choosing [facility] to provide your oncology and hematology care.   If you have a lab appointment with the Cancer Center, please go directly to the Cancer Center and check in at the registration area.   Wear comfortable clothing and clothing appropriate for easy access to any Portacath or PICC line.   We strive to give you quality time with your provider. You may need to reschedule your appointment if you arrive late (15 or more minutes).  Arriving late affects you and other patients whose appointments are after yours.  Also, if you miss three or more appointments without notifying the office, you may be dismissed from the clinic at the provider's discretion.      For prescription refill requests, have your pharmacy contact our office and allow 72 hours for refills to be completed.    Today you received the following chemotherapy and/or immunotherapy agents: doxorubicin and cyclophosphamide      To help prevent nausea and vomiting after your treatment, we encourage you to take your nausea medication as directed.  BELOW ARE SYMPTOMS THAT SHOULD BE REPORTED IMMEDIATELY: *FEVER GREATER THAN 100.4 F (38 C) OR HIGHER *CHILLS OR SWEATING *NAUSEA AND VOMITING THAT IS NOT CONTROLLED WITH YOUR NAUSEA MEDICATION *UNUSUAL SHORTNESS OF BREATH *UNUSUAL BRUISING OR BLEEDING *URINARY PROBLEMS (pain or burning when urinating, or frequent urination) *BOWEL PROBLEMS (unusual diarrhea, constipation, pain near the anus) TENDERNESS IN MOUTH AND THROAT WITH OR WITHOUT PRESENCE OF ULCERS (sore throat, sores in mouth, or a toothache) UNUSUAL RASH, SWELLING OR PAIN  UNUSUAL VAGINAL DISCHARGE OR ITCHING   Items with * indicate a potential emergency and should be followed up as soon as possible or go to the Emergency Department if any problems should occur.  Please show the CHEMOTHERAPY ALERT  CARD or IMMUNOTHERAPY ALERT CARD at check-in to the Emergency Department and triage nurse.  Should you have questions after your visit or need to cancel or reschedule your appointment, please contact [facility] - A DEPT OF [facility]  Dept: [phone]  and follow the prompts.  Office hours are 8:00 a.m. to 4:30 p.m. Monday - Friday. Please note that voicemails left after 4:00 p.m. may not be returned until the following business day.  We are closed weekends and major holidays. You have access to a nurse at all times for urgent questions. Please call the main number to the clinic Dept: [phone] and follow the prompts.   For any non-urgent questions, you may also contact your provider using MyChart. We now offer e-Visits for anyone 50 and older to request care online for non-urgent symptoms. For details visit [email or web address].   Also download the MyChart app! Go to the app store, search "MyChart", open the app, select [facility], and log in with your MyChart username and password.

## 2024-05-30 ENCOUNTER — Telehealth: Payer: Self-pay | Admitting: *Deleted

## 2024-05-30 ENCOUNTER — Encounter: Payer: Self-pay | Admitting: Licensed Clinical Social Worker

## 2024-05-30 NOTE — Telephone Encounter (Signed)
-----   Message from Nurse Bernardino DEL, RN sent at 05/29/2024  2:44 PM EST ----- Regarding: 1st Time Follow-up 1st Time A/C No issues during treatment. Dr Iruku's patient.

## 2024-05-30 NOTE — Progress Notes (Signed)
 [facility] CSW Progress Note  Clinical Child Psychotherapist contacted patient by phone to follow-up on [employer] application.    Interventions: Notified pt she was approved for assistance through [employer]'s [employer] Purse      Follow Up Plan:  Patient will contact CSW with any support or resource needs    Hannah [name] E Hannah Tubby, LCSW Clinical Social Worker [facility]    Patient is participating in a Managed Medicaid Plan:  Yes

## 2024-05-30 NOTE — Telephone Encounter (Signed)
 Called to check on pt to see how she did with her recent treatment.  Left VM to call us  if she has any questions or concerns.

## 2024-05-31 ENCOUNTER — Inpatient Hospital Stay

## 2024-05-31 VITALS — BP 117/56 | HR 68 | Temp 97.2°F | Resp 20

## 2024-05-31 DIAGNOSIS — Z5111 Encounter for antineoplastic chemotherapy: Secondary | ICD-10-CM | POA: Diagnosis not present

## 2024-05-31 DIAGNOSIS — C50311 Malignant neoplasm of lower-inner quadrant of right female breast: Secondary | ICD-10-CM

## 2024-05-31 MED ORDER — PEGFILGRASTIM-CBQV 6 MG/0.6ML ~~LOC~~ SOSY
6.0000 mg | PREFILLED_SYRINGE | Freq: Once | SUBCUTANEOUS | Status: AC
  Administered 2024-05-31: 6 mg via SUBCUTANEOUS

## 2024-05-31 NOTE — Patient Instructions (Signed)

## 2024-06-02 ENCOUNTER — Other Ambulatory Visit: Payer: Self-pay | Admitting: Hematology and Oncology

## 2024-06-02 ENCOUNTER — Encounter: Payer: Self-pay | Admitting: *Deleted

## 2024-06-02 DIAGNOSIS — Z171 Estrogen receptor negative status [ER-]: Secondary | ICD-10-CM

## 2024-06-03 ENCOUNTER — Telehealth: Payer: Self-pay | Admitting: *Deleted

## 2024-06-03 ENCOUNTER — Other Ambulatory Visit: Payer: Self-pay

## 2024-06-03 NOTE — Telephone Encounter (Signed)
 Received VM from patient.  Returned patient's call but had to leave VM for a return phone call.  Gave contact info for myself and cancer center at [phone].

## 2024-06-05 ENCOUNTER — Encounter: Payer: Self-pay | Admitting: *Deleted

## 2024-06-05 NOTE — Progress Notes (Signed)
 Hannah [name]                                          MRN: [id number]   06/05/2024   The [facility] Quality Team Specialist reviewed this patient medical record for the purposes of chart review for care gap closure. The following were reviewed: abstraction for care gap closure-controlling blood pressure.    VBCI Quality Team

## 2024-06-06 DIAGNOSIS — M545 Low back pain, unspecified: Secondary | ICD-10-CM | POA: Diagnosis present

## 2024-06-06 DIAGNOSIS — E119 Type 2 diabetes mellitus without complications: Secondary | ICD-10-CM | POA: Insufficient documentation

## 2024-06-06 DIAGNOSIS — F17210 Nicotine dependence, cigarettes, uncomplicated: Secondary | ICD-10-CM | POA: Insufficient documentation

## 2024-06-06 DIAGNOSIS — R109 Unspecified abdominal pain: Secondary | ICD-10-CM | POA: Insufficient documentation

## 2024-06-06 DIAGNOSIS — Z853 Personal history of malignant neoplasm of breast: Secondary | ICD-10-CM | POA: Diagnosis not present

## 2024-06-06 DIAGNOSIS — I10 Essential (primary) hypertension: Secondary | ICD-10-CM | POA: Diagnosis not present

## 2024-06-07 ENCOUNTER — Emergency Department (HOSPITAL_COMMUNITY)

## 2024-06-07 ENCOUNTER — Other Ambulatory Visit: Payer: Self-pay

## 2024-06-07 ENCOUNTER — Emergency Department (HOSPITAL_COMMUNITY)
Admission: EM | Admit: 2024-06-07 | Discharge: 2024-06-07 | Disposition: A | Discharge status: Home / Self Care | Attending: Emergency Medicine | Admitting: Emergency Medicine

## 2024-06-07 ENCOUNTER — Encounter (HOSPITAL_COMMUNITY): Payer: Self-pay | Admitting: Emergency Medicine

## 2024-06-07 DIAGNOSIS — M545 Low back pain, unspecified: Secondary | ICD-10-CM

## 2024-06-07 LAB — COMPREHENSIVE METABOLIC PANEL WITH GFR
ALT: 16 U/L (ref 0–44)
AST: 22 U/L (ref 15–41)
Albumin: 4.4 g/dL (ref 3.5–5.0)
Alkaline Phosphatase: 124 U/L (ref 38–126)
Anion gap: 14 (ref 5–15)
BUN: 16 mg/dL (ref 8–23)
CO2: 25 mmol/L (ref 22–32)
Calcium: 9.6 mg/dL (ref 8.9–10.3)
Chloride: 98 mmol/L (ref 98–111)
Creatinine, Ser: 0.89 mg/dL (ref 0.44–1.00)
GFR, Estimated: >60 mL/min
Glucose, Bld: 162 mg/dL — ABNORMAL HIGH (ref 70–99)
Potassium: 4.2 mmol/L (ref 3.5–5.1)
Sodium: 137 mmol/L (ref 135–145)
Total Bilirubin: <0.2 mg/dL (ref 0.0–1.2)
Total Protein: 7.6 g/dL (ref 6.5–8.1)

## 2024-06-07 LAB — URINALYSIS, ROUTINE W REFLEX MICROSCOPIC
Bilirubin Urine: NEGATIVE
Glucose, UA: NEGATIVE mg/dL
Ketones, ur: NEGATIVE mg/dL
Nitrite: NEGATIVE
Protein, ur: NEGATIVE mg/dL
Specific Gravity, Urine: 1.01 (ref 1.005–1.030)
pH: 5 (ref 5.0–8.0)

## 2024-06-07 LAB — CBC
HCT: 36.5 % (ref 36.0–46.0)
Hemoglobin: 12.5 g/dL (ref 12.0–15.0)
MCH: 28.7 pg (ref 26.0–34.0)
MCHC: 34.2 g/dL (ref 30.0–36.0)
MCV: 83.7 fL (ref 80.0–100.0)
Platelets: 269 K/uL (ref 150–400)
RBC: 4.36 MIL/uL (ref 3.87–5.11)
RDW: 13.7 % (ref 11.5–15.5)
WBC: 19.2 K/uL — ABNORMAL HIGH (ref 4.0–10.5)
nRBC: 0 % (ref 0.0–0.2)

## 2024-06-07 MED ORDER — MORPHINE SULFATE (PF) 4 MG/ML IV SOLN
4.0000 mg | Freq: Once | INTRAVENOUS | Status: AC
  Administered 2024-06-07: 4 mg via INTRAVENOUS
  Filled 2024-06-07: qty 1

## 2024-06-07 MED ORDER — METHOCARBAMOL 500 MG PO TABS: 500.0000 mg | ORAL_TABLET | Freq: Three times a day (TID) | ORAL | 0 refills | Status: AC | PRN

## 2024-06-07 MED ORDER — NAPROXEN 500 MG PO TABS: 500.0000 mg | ORAL_TABLET | Freq: Two times a day (BID) | ORAL | 0 refills | Status: AC

## 2024-06-07 MED ORDER — SODIUM CHLORIDE 0.9 % IV BOLUS
1000.0000 mL | Freq: Once | INTRAVENOUS | Status: AC
  Administered 2024-06-07: 1000 mL via INTRAVENOUS

## 2024-06-07 MED ORDER — LIDOCAINE 5 % EX PTCH: 1.0000 | MEDICATED_PATCH | CUTANEOUS | 0 refills | Status: AC

## 2024-06-07 NOTE — ED Triage Notes (Signed)
" °  Patient comes in with lower back pain that started yesterday morning after waking up.  Patient states it spasms in certain positions and causes severe pain.  Recently started chemotherapy for stage 2 breast cancer.  Denies any issues with urine or voiding.  Denies any N/V.  Pain 10/10, spasm/shooting.  Has not taken anything for pain at home due to recently starting chemo.   "

## 2024-06-07 NOTE — ED Provider Notes (Signed)
 " [facility] Emergency Department Provider Note MRN:  [id number]  Arrival date & time: 06/07/24     Chief Complaint   Back Pain   History of Present Illness   Hannah [name] is a 64 y.o. year-old female with a history of diabetes, breast cancer presenting to the ED with chief complaint of back pain.  Started having severe episodes of low back pain yesterday morning.  Happening intermittently throughout the day with certain movements and positions.  Also feeling dehydrated.  Recently started treatment for breast cancer.  Denies fever, no numbness or weakness to the arms or legs.  Review of Systems  A thorough review of systems was obtained and all systems are negative except as noted in the HPI and PMH.   Patient's Health History    Past Medical History:  Diagnosis Date   Arthritis    feet   Cancer (HCC)    Breast   Diabetes mellitus    type 2   Dysrhythmia    Family history of adverse reaction to anesthesia    father: post-op nausea and vomiting   Hypertension    Osteopenia    Wears glasses    Wears partial dentures    partial upper    Past Surgical History:  Procedure Laterality Date   ABDOMINAL HYSTERECTOMY     in her 30s   BREAST BIOPSY Right    BREAST SURGERY  2 different occasions, over 5 years ago   cyst removal BIL   CHOLECYSTECTOMY N/A 12/25/2016   Procedure: LYSIS OF ADHESIONS, LAPAROSCOPIC CHOLECYSTECTOMY WITH INTRAOPERATIVE CHOLANGIOGRAM;  Surgeon: Tanda Locus, MD;  Location: [facility];  Service: General;  Laterality: N/A;   COLON SURGERY  at least 6 yrs ago per patient   polyps and fissures    DIAGNOSTIC LAPAROSCOPY  over 20 years ago   exploratory lap   FOOT SURGERY  05/23/1983   left   INSERTION OF MESH N/A 11/13/2014   Procedure: INSERTION OF MESH;  Surgeon: Lynda Leos, MD;  Location: [facility];  Service: General;  Laterality: N/A;   MICROLARYNGOSCOPY N/A 05/19/2014   Procedure: MICROLARYNGOSCOPY WITH EXCISION OF VOCAL  CORD LESION;  Surgeon: Lonni FORBES Angle, MD;  Location: [facility];  Service: ENT;  Laterality: N/A;   MICROLARYNGOSCOPY WITH CO2 LASER AND EXCISION OF VOCAL CORD LESION N/A 09/05/2019   Procedure: MICROLARYNGOSCOPY WITH EXCISION OF VOCAL CORD LESION;  Surgeon: Angle Lonni FORBES, MD;  Location: [facility];  Service: ENT;  Laterality: N/A;   PORTACATH PLACEMENT Right 05/28/2024   Procedure: INSERTION, TUNNELED CENTRAL VENOUS DEVICE, WITH PORT;  Surgeon: Ebbie Cough, MD;  Location: [facility];  Service: General;  Laterality: Right;  PORT PLACEMENT WITH ULTRASOUND GUIDANCE   rectal fissure     SENTINEL NODE BIOPSY Right 05/01/2024   Procedure: BIOPSY, LYMPH NODE, SENTINEL;  Surgeon: Ebbie Cough, MD;  Location: [facility] OR;  Service: General;  Laterality: Right;   SHOULDER SURGERY Right    UMBILICAL HERNIA REPAIR N/A 11/13/2014   Procedure: LAPAROSCOPIC UMBILICAL HERNIA REPAIR WITH MESH;  Surgeon: Lynda Leos, MD;  Location: [facility];  Service: General;  Laterality: N/A;    Family History  Problem Relation Age of Onset   Cancer Maternal Cousin        blood cancer    Social History   Socioeconomic History   Marital status: Married    Spouse name: Not on file   Number of children: Not on file  Years of education: Not on file   Highest education level: Not on file  Occupational History   Not on file  Tobacco Use   Smoking status: Every Day    Current packs/day: 0.75    Average packs/day: 0.8 packs/day for 55.0 years (41.3 ttl pk-yrs)    Types: Cigarettes    Start date: 67   Smokeless tobacco: Never   Tobacco comments:    As of 04/28/24: cut back to 1/2 pack a day from 1 pack a day  Vaping Use   Vaping status: Never Used  Substance and Sexual Activity   Alcohol use: No   Drug use: No   Sexual activity: Yes  Other Topics Concern   Not on file  Social History Narrative   Not on file   Social Drivers of Health    Tobacco Use: High Risk (06/07/2024)   Patient History    Smoking Tobacco Use: Every Day    Smokeless Tobacco Use: Never    Passive Exposure: Not on file  Financial Resource Strain: Not on file  Food Insecurity: Food Insecurity Present (04/23/2024)   Epic    Worried About Programme Researcher, Broadcasting/film/video in the Last Year: Sometimes true    [employer] of Food in the Last Year: Sometimes true  Transportation Needs: No Transportation Needs (04/23/2024)   Epic    Lack of Transportation (Medical): No    Lack of Transportation (Non-Medical): No  Physical Activity: Not on file  Stress: Not on file  Social Connections: Not on file  Intimate Partner Violence: Not At Risk (04/23/2024)   Epic    Fear of Current or Ex-Partner: No    Emotionally Abused: No    Physically Abused: No    Sexually Abused: No  Depression (PHQ2-9): Low Risk (05/29/2024)   Depression (PHQ2-9)    PHQ-2 Score: 0  Recent Concern: Depression (PHQ2-9) - High Risk (04/23/2024)   Depression (PHQ2-9)    PHQ-2 Score: 17  Alcohol Screen: Not on file  Housing: Low Risk (04/23/2024)   Epic    Unable to Pay for Housing in the Last Year: No    Number of Times Moved in the Last Year: 0    Homeless in the Last Year: No  Utilities: Not At Risk (04/23/2024)   Epic    Threatened with loss of utilities: No  Health Literacy: Not on file     Physical Exam   Vitals:   06/07/24 0018 06/07/24 0126  BP: 113/69 (!) 104/58  Pulse: (!) 110   Resp: 20 17  Temp: 98.3 F (36.8 C) 98.4 F (36.9 C)  SpO2: 97%     CONSTITUTIONAL: Well-appearing, occasional grimace with pain NEURO/PSYCH:  Alert and oriented x 3, normal and symmetric strength and sensation to the lower extremities EYES:  eyes equal and reactive ENT/NECK:  no LAD, no JVD CARDIO: Tachycardic rate, well-perfused, normal S1 and S2 PULM:  CTAB no wheezing or rhonchi GI/GU:  non-distended, non-tender MSK/SPINE:  No gross deformities, no edema SKIN:  no rash, atraumatic   *Additional  and/or pertinent findings included in MDM below  Diagnostic and Interventional Summary    EKG Interpretation Date/Time:    Ventricular Rate:    PR Interval:    QRS Duration:    QT Interval:    QTC Calculation:   R Axis:      Text Interpretation:         Labs Reviewed  CBC - Abnormal; Notable for the following components:  Result Value   WBC 19.2 (*)    All other components within normal limits  COMPREHENSIVE METABOLIC PANEL WITH GFR - Abnormal; Notable for the following components:   Glucose, Bld 162 (*)    All other components within normal limits  URINALYSIS, ROUTINE W REFLEX MICROSCOPIC - Abnormal; Notable for the following components:   Color, Urine STRAW (*)    APPearance HAZY (*)    Hgb urine dipstick SMALL (*)    Leukocytes,Ua TRACE (*)    Bacteria, UA RARE (*)    All other components within normal limits  URINE CULTURE    CT ABDOMEN PELVIS WO CONTRAST  Final Result    CT L-SPINE NO CHARGE  Final Result      Medications  morphine  (PF) 4 MG/ML injection 4 mg (4 mg Intravenous Given 06/07/24 0145)  sodium chloride  0.9 % bolus 1,000 mL (1,000 mLs Intravenous New Bag/Given 06/07/24 0145)     Procedures  /  Critical Care Procedures  ED Course and Medical Decision Making  Initial Impression and Ddx Differential diagnosis includes metastatic lesion to the spine, pathologic fracture, retroperitoneal bleeding, MSK strain or spasm  Past medical/surgical history that increases complexity of ED encounter: Breast cancer  Interpretation of Diagnostics I personally reviewed the Laboratory Testing and my interpretation is as follows: Leukocytosis noted  Urinalysis not overly convincing for infection, sending for culture.  Patient not having any dysuria or fever.  No suprapubic tenderness.  Patient Reassessment and Ultimate Disposition/Management     CT imaging is without acute process.  Patient looking and feeling better on reassessment.  No signs of emergent  process patient is appropriate for discharge with return precautions and follow-up.  Patient management required discussion with the following services or consulting groups:  None  Complexity of Problems Addressed Acute illness or injury that poses threat of life of bodily function  Additional Data Reviewed and Analyzed Further history obtained from: Further history from spouse/family member  Additional Factors Impacting ED Encounter Risk Use of parenteral controlled substances and Consideration of hospitalization  Ozell HERO. Theadore, MD [facility] Emergency Medicine [facility] [email or web address]  Final Clinical Impressions(s) / ED Diagnoses     ICD-10-CM   1. Acute bilateral low back pain without sciatica  M54.50       ED Discharge Orders          Ordered    naproxen  (NAPROSYN ) 500 MG tablet  2 times daily        06/07/24 0336    methocarbamol  (ROBAXIN ) 500 MG tablet  Every 8 hours PRN        06/07/24 0336    lidocaine  (LIDODERM ) 5 %  Every 24 hours        06/07/24 0336             Discharge Instructions Discussed with and Provided to Patient:     Discharge Instructions      You were evaluated in the Emergency Department and after careful evaluation, we did not find any emergent condition requiring admission or further testing in the hospital.  Your exam/testing today is overall reassuring.  Symptoms seem to be due to muscular strain or spasm.  CT scans did not show any other significant issues.  Recommend using the Naprosyn  twice daily as prescribed for pain.  Can use the Robaxin  muscle relaxer for more significant pain, best used at night if you are having trouble sleeping as it can cause drowsiness.  Can also use the Lidoderm   numbing patches daily as needed.  Continue follow-up with your regular doctors.  Please return to the Emergency Department if you experience any worsening of your condition.   Thank you for allowing us  to be a part of your  care.       Theadore Ozell HERO, MD 06/07/24 [phone]  "

## 2024-06-07 NOTE — Discharge Instructions (Addendum)
 You were evaluated in the Emergency Department and after careful evaluation, we did not find any emergent condition requiring admission or further testing in the hospital.  Your exam/testing today is overall reassuring.  Symptoms seem to be due to muscular strain or spasm.  CT scans did not show any other significant issues.  Recommend using the Naprosyn  twice daily as prescribed for pain.  Can use the Robaxin  muscle relaxer for more significant pain, best used at night if you are having trouble sleeping as it can cause drowsiness.  Can also use the Lidoderm  numbing patches daily as needed.  Continue follow-up with your regular doctors.  Please return to the Emergency Department if you experience any worsening of your condition.   Thank you for allowing us  to be a part of your care.

## 2024-06-08 LAB — URINE CULTURE: Culture: NO GROWTH

## 2024-06-10 MED FILL — Fosaprepitant Dimeglumine For IV Infusion 150 MG (Base Eq): INTRAVENOUS | Qty: 5 | Status: AC

## 2024-06-10 NOTE — Progress Notes (Unsigned)
 " [facility] Telephone:([phone]   Fax:([phone]  PROGRESS NOTE  Patient Care Team: Benjamine Aland, MD as PCP - General (Family Medicine) Tyree Nanetta SAILOR, RN as Oncology Nurse Navigator Ebbie Cough, MD as Consulting Physician (General Surgery) Loretha Ash, MD as Consulting Physician (Hematology and Oncology) Dewey Rush, MD as Consulting Physician (Radiation Oncology)  CHIEF COMPLAINTS/PURPOSE OF CONSULTATION:  Right breast cancer  Oncology History  Malignant neoplasm of lower-inner quadrant of right breast of female, estrogen receptor negative (HCC)  04/18/2024 Initial Diagnosis   Malignant neoplasm of lower-inner quadrant of right breast of female, estrogen receptor negative (HCC)   05/01/2024 Genetic Testing   Negative [facility] + RNA insight. Report date 05/01/2024.  [facility] + RNAinsight gene panel which includes sequencing, rearrangement analysis, and RNA analysis for the following 40 genes: APC, ATM, BAP1, BARD1, BMPR1A, BRCA1, BRCA2, BRIP1, CDH1, CDKN2A, CHEK2, FH, FLCN, MET, MLH1, MSH2, MSH6, MUTYH, NF1, NTHL1, PALB2, PMS2, PTEN, RAD51C, RAD51D, RPS20, SMAD4, STK11, TP53, TSC1, TSC2, and VHL (sequencing and deletion/duplication); AXIN2, HOXB13, MBD4, MSH3, POLD1 and POLE (sequencing only); EPCAM and GREM1 (deletion/duplication only).    05/29/2024 -  Chemotherapy   Patient is on Treatment Plan : BREAST DOSE DENSE AC q14d / PACLitaxel q7d      CURRENT TREATMENT: AC chemotherapy  INTERVAL HISTORY:  Hannah [name] 64 y.o. female returns for a follow up visit prior to cycle 2, day 1 of AC chemotherapy. She is accompanied by her son for this visit.   On exam today, Hannah [name] reports she experienced fatigued for 3-4 days after treatment. Her appetite decreased requiring her to eat small, frequent meals. She felt dehydrated last week as well. The steroids increased her blood glucose levels so she adjusted her diabetes  medications. She did experience some nausea without vomiting that improved with antiemetics as needed. She denies any bowel habit changes. Her surgical wound is healing well with mild discharge noted. She went to the ER for low back pain which improved with naproxen  and robaxin . She denies easy bruising or overt signs of bleeding. She denies fevers, chills, sweats, shortness of breath, chest pain or cough. Rest of the ROS is below.   MEDICAL HISTORY:  Past Medical History:  Diagnosis Date   Arthritis    feet   Cancer (HCC)    Breast   Diabetes mellitus    type 2   Dysrhythmia    Family history of adverse reaction to anesthesia    father: post-op nausea and vomiting   Hypertension    Osteopenia    Wears glasses    Wears partial dentures    partial upper    SURGICAL HISTORY: Past Surgical History:  Procedure Laterality Date   ABDOMINAL HYSTERECTOMY     in her 30s   BREAST BIOPSY Right    BREAST SURGERY  2 different occasions, over 5 years ago   cyst removal BIL   CHOLECYSTECTOMY N/A 12/25/2016   Procedure: LYSIS OF ADHESIONS, LAPAROSCOPIC CHOLECYSTECTOMY WITH INTRAOPERATIVE CHOLANGIOGRAM;  Surgeon: Tanda Locus, MD;  Location: [facility];  Service: General;  Laterality: N/A;   COLON SURGERY  at least 6 yrs ago per patient   polyps and fissures    DIAGNOSTIC LAPAROSCOPY  over 20 years ago   exploratory lap   FOOT SURGERY  05/23/1983   left   INSERTION OF MESH N/A 11/13/2014   Procedure: INSERTION OF MESH;  Surgeon: Lynda Leos, MD;  Location: [facility];  Service: General;  Laterality:  N/A;   MICROLARYNGOSCOPY N/A 05/19/2014   Procedure: MICROLARYNGOSCOPY WITH EXCISION OF VOCAL CORD LESION;  Surgeon: Lonni FORBES Angle, MD;  Location: [facility];  Service: ENT;  Laterality: N/A;   MICROLARYNGOSCOPY WITH CO2 LASER AND EXCISION OF VOCAL CORD LESION N/A 09/05/2019   Procedure: MICROLARYNGOSCOPY WITH EXCISION OF VOCAL CORD LESION;  Surgeon: Angle Lonni FORBES, MD;   Location: [facility];  Service: ENT;  Laterality: N/A;   PORTACATH PLACEMENT Right 05/28/2024   Procedure: INSERTION, TUNNELED CENTRAL VENOUS DEVICE, WITH PORT;  Surgeon: Ebbie Cough, MD;  Location: [facility];  Service: General;  Laterality: Right;  PORT PLACEMENT WITH ULTRASOUND GUIDANCE   rectal fissure     SENTINEL NODE BIOPSY Right 05/01/2024   Procedure: BIOPSY, LYMPH NODE, SENTINEL;  Surgeon: Ebbie Cough, MD;  Location: [facility];  Service: General;  Laterality: Right;   SHOULDER SURGERY Right    UMBILICAL HERNIA REPAIR N/A 11/13/2014   Procedure: LAPAROSCOPIC UMBILICAL HERNIA REPAIR WITH MESH;  Surgeon: Lynda Leos, MD;  Location: [facility];  Service: General;  Laterality: N/A;    SOCIAL HISTORY: Social History   Socioeconomic History   Marital status: Married    Spouse name: Not on file   Number of children: Not on file   Years of education: Not on file   Highest education level: Not on file  Occupational History   Not on file  Tobacco Use   Smoking status: Every Day    Current packs/day: 0.75    Average packs/day: 0.8 packs/day for 55.1 years (41.3 ttl pk-yrs)    Types: Cigarettes    Start date: 17   Smokeless tobacco: Never   Tobacco comments:    As of 04/28/24: cut back to 1/2 pack a day from 1 pack a day  Vaping Use   Vaping status: Never Used  Substance and Sexual Activity   Alcohol use: No   Drug use: No   Sexual activity: Yes  Other Topics Concern   Not on file  Social History Narrative   Not on file   Social Drivers of Health   Tobacco Use: High Risk (06/07/2024)   Patient History    Smoking Tobacco Use: Every Day    Smokeless Tobacco Use: Never    Passive Exposure: Not on file  Financial Resource Strain: Not on file  Food Insecurity: Food Insecurity Present (04/23/2024)   Epic    Worried About Radiation Protection Practitioner of Food in the Last Year: Sometimes true    [employer] of Food in the Last Year: Sometimes true   Transportation Needs: No Transportation Needs (04/23/2024)   Epic    Lack of Transportation (Medical): No    Lack of Transportation (Non-Medical): No  Physical Activity: Not on file  Stress: Not on file  Social Connections: Not on file  Intimate Partner Violence: Not At Risk (04/23/2024)   Epic    Fear of Current or Ex-Partner: No    Emotionally Abused: No    Physically Abused: No    Sexually Abused: No  Depression (PHQ2-9): Low Risk (05/29/2024)   Depression (PHQ2-9)    PHQ-2 Score: 0  Recent Concern: Depression (PHQ2-9) - High Risk (04/23/2024)   Depression (PHQ2-9)    PHQ-2 Score: 17  Alcohol Screen: Not on file  Housing: Low Risk (04/23/2024)   Epic    Unable to Pay for Housing in the Last Year: No    Number of Times Moved in the Last Year: 0  Homeless in the Last Year: No  Utilities: Not At Risk (04/23/2024)   Epic    Threatened with loss of utilities: No  Health Literacy: Not on file    FAMILY HISTORY: Family History  Problem Relation Age of Onset   Cancer Maternal Cousin        blood cancer    ALLERGIES:  is allergic to hydrochlorothiazide and latex.  MEDICATIONS:  Current Outpatient Medications  Medication Sig Dispense Refill   acetaminophen  (TYLENOL ) 500 MG tablet Take 500-1,000 mg by mouth every 6 (six) hours as needed (pain.).     amLODipine  (NORVASC ) 5 MG tablet Take 5 mg by mouth in the morning.     chlorthalidone (HYGROTON) 25 MG tablet Take 25 mg by mouth in the morning.     cholecalciferol (VITAMIN D3) 25 MCG (1000 UNIT) tablet Take 1,000 Units by mouth in the morning.     cyanocobalamin (VITAMIN B12) 1000 MCG tablet Take 1,000 mcg by mouth in the morning.     dexamethasone  (DECADRON ) 4 MG tablet Take 2 tablets (8 mg total) by mouth daily for 3 days. Start the day after doxorubicin /cyclophosphamide  chemotherapy. Take with food. 30 tablet 1   lidocaine  (LIDODERM ) 5 % Place 1 patch onto the skin daily. Remove & Discard patch within 12 hours or as directed  by MD 5 patch 0   lisinopril  (PRINIVIL ,ZESTRIL ) 40 MG tablet Take 40 mg by mouth in the morning.     magic mouthwash (lidocaine , diphenhydrAMINE , alum & mag hydroxide) suspension Swish and spit 5 mLs 3 (three) times daily as needed. 240 mL 0   metFORMIN  (GLUCOPHAGE ) 500 MG tablet Take 500 mg by mouth 2 (two) times daily with a meal.     methocarbamol  (ROBAXIN ) 500 MG tablet Take 1 tablet (500 mg total) by mouth every 8 (eight) hours as needed for muscle spasms. 30 tablet 0   naproxen  (NAPROSYN ) 500 MG tablet Take 1 tablet (500 mg total) by mouth 2 (two) times daily. 30 tablet 0   ondansetron  (ZOFRAN ) 8 MG tablet Take 1 tab (8 mg) by mouth every 8 hrs as needed for nausea/vomiting. Start third day after doxorubicin /cyclophosphamide  chemotherapy. 30 tablet 1   oxyCODONE  (OXY IR/ROXICODONE ) 5 MG immediate release tablet Take 1 tablet (5 mg total) by mouth every 6 (six) hours as needed. 10 tablet 0   prochlorperazine  (COMPAZINE ) 10 MG tablet Take 1 tablet (10 mg total) by mouth every 6 (six) hours as needed for nausea or vomiting. 30 tablet 1   No current facility-administered medications for this visit.    REVIEW OF SYSTEMS:   Constitutional: ( - ) fevers, ( - )  chills , ( - ) night sweats Eyes: ( - ) blurriness of vision, ( - ) double vision, ( - ) watery eyes Ears, nose, mouth, throat, and face: ( - ) mucositis, ( - ) sore throat Respiratory: ( - ) cough, ( - ) dyspnea, ( - ) wheezes Cardiovascular: ( - ) palpitation, ( - ) chest discomfort, ( - ) lower extremity swelling Gastrointestinal:  (+ ) nausea, ( - ) heartburn, ( - ) change in bowel habits Skin: ( - ) abnormal skin rashes Lymphatics: ( - ) new lymphadenopathy, ( - ) easy bruising Neurological: ( - ) numbness, ( - ) tingling, ( - ) new weaknesses Behavioral/Psych: ( - ) mood change, ( - ) new changes  All other systems were reviewed with the patient and are negative.  PHYSICAL EXAMINATION: ECOG PERFORMANCE STATUS:  1 - Symptomatic  but completely ambulatory  Vitals:   06/11/24 0830  BP: 130/60  Pulse: 93  Resp: 17  Temp: (!) 97.3 F (36.3 C)  SpO2: 98%   Filed Weights   06/11/24 0830  Weight: 134 lb 4.8 oz (60.9 kg)    GENERAL: well appearing female in NAD  SKIN: skin color, texture, turgor are normal, no rashes or significant lesions EYES: conjunctiva are pink and non-injected, sclera clear OROPHARYNX: no exudate, no erythema; lips, buccal mucosa, and tongue normal  NECK: supple, non-tender LUNGS: clear to auscultation and percussion with normal breathing effort HEART: regular rate & rhythm and no murmurs and no lower extremity edema Musculoskeletal: no cyanosis of digits and no clubbing  PSYCH: alert & oriented x 3, fluent speech NEURO: no focal motor/sensory deficits BREAST: healing surgical scar with no erythema or purulent discharge  LABORATORY DATA:  I have reviewed the data as listed    Latest Ref Rng & Units 06/11/2024    8:06 AM 06/07/2024    1:51 AM 05/29/2024   10:39 AM  CBC  WBC 4.0 - 10.5 K/uL 17.6  19.2  12.3   Hemoglobin 12.0 - 15.0 g/dL 88.1  87.4  89.0   Hematocrit 36.0 - 46.0 % 34.8  36.5  32.6   Platelets 150 - 400 K/uL 279  269  398        Latest Ref Rng & Units 06/11/2024    8:06 AM 06/07/2024    1:51 AM 05/29/2024   10:39 AM  CMP  Glucose 70 - 99 mg/dL 820  837  860   BUN 8 - 23 mg/dL 21  16  21    Creatinine 0.44 - 1.00 mg/dL 9.20  9.10  9.20   Sodium 135 - 145 mmol/L 139  137  141   Potassium 3.5 - 5.1 mmol/L 3.6  4.2  3.4   Chloride 98 - 111 mmol/L 98  98  102   CO2 22 - 32 mmol/L 27  25  29    Calcium 8.9 - 10.3 mg/dL 9.6  9.6  9.7   Total Protein 6.5 - 8.1 g/dL 7.5  7.6  6.9   Total Bilirubin 0.0 - 1.2 mg/dL <9.7  <9.7  0.3   Alkaline Phos 38 - 126 U/L 139  124  73   AST 15 - 41 U/L 17  22  20    ALT 0 - 44 U/L 25  16  15      RADIOGRAPHIC STUDIES: I have personally reviewed the radiological images as listed and agreed with the findings in the report. CT ABDOMEN  PELVIS WO CONTRAST Result Date: 06/07/2024 EXAM: CT ABDOMEN AND PELVIS WITHOUT CONTRAST 06/07/2024 01:34:21 AM TECHNIQUE: CT of the abdomen and pelvis was performed without the administration of intravenous contrast. Multiplanar reformatted images are provided for review. Automated exposure control, iterative reconstruction, and/or weight-based adjustment of the mA/kV was utilized to reduce the radiation dose to as low as reasonably achievable. COMPARISON: CT without IV contrast 08/22/2013. CLINICAL HISTORY: Abdominal/flank pain, stone suspected. FINDINGS: LOWER CHEST: There are posterior atelectatic changes in the lower lobes. No acute lung base findings. The cardiac size is normal. LIVER: The liver is 19 cm in length, mildly steatotic, including with focal periligamentous fat in segment 4b. There are occasional tiny calcified granulomas in the liver. No masses seen without contrast. GALLBLADDER AND BILE DUCTS: The gallbladder has been removed since the prior study. There is no biliary ductal dilatation. SPLEEN: The spleen is normal  in size and noncontrast attenuation. PANCREAS: No contour deforming abnormality or ductal dilatation of the unenhanced pancreas. ADRENAL GLANDS: There is no adrenal mass. KIDNEYS, URETERS AND BLADDER: No contour deforming abnormality of the unenhanced kidneys is seen with bilateral fetal lobation. There is no urinary stone or hydronephrosis. No perinephric or periureteral stranding. The bladder is unremarkable. GI AND BOWEL: There are thickened folds in the proximal stomach consistent with gastritis. There is no small bowel obstruction or inflammatory change. There are thick folds in the jejunum consistent with nonspecific enteritis. The normal appendix is well visible. Scattered colonic diverticula are present, but there is no evidence of acute diverticulitis or colitis. PERITONEUM AND RETROPERITONEUM: There is no free fluid, free hemorrhage, or free air, or incarcerated hernia.  VASCULATURE: There is moderate aortoiliac atherosclerosis without aneurysm. LYMPH NODES: No lymphadenopathy. REPRODUCTIVE ORGANS: The uterus is surgically absent. There is no adnexal mass. Scattered pelvic phleboliths are present. BONES AND SOFT TISSUES: No acute osseous abnormality. No focal soft tissue abnormality. IMPRESSION: 1. No urinary stone or hydronephrosis. 2. Thickened folds in the proximal stomach and jejunum consistent with gastroenteritis. 3. No other acute noncontrast CT  findings in the abdomen or pelvis. Electronically signed by: Francis Quam MD 06/07/2024 02:28 AM EST RP Workstation: [id number]   CT L-SPINE NO CHARGE Result Date: 06/07/2024 EXAM: CT OF THE LUMBAR SPINE WITHOUT CONTRAST 06/07/2024 01:34:21 AM TECHNIQUE: CT of the lumbar spine was performed without the administration of intravenous contrast. Multiplanar reformatted images are provided for review. Automated exposure control, iterative reconstruction, and/or weight based adjustment of the mA/kV was utilized to reduce the radiation dose to as low as reasonably achievable. COMPARISON: None available. CLINICAL HISTORY: FINDINGS: BONES AND ALIGNMENT: Normal vertebral body heights. No acute fracture or suspicious bone lesion. Normal alignment. DEGENERATIVE CHANGES: No significant degenerative changes. SOFT TISSUES: Calcific aortic atherosclerosis. IMPRESSION: 1. No acute findings. Electronically signed by: Franky Stanford MD 06/07/2024 02:00 AM EST RP Workstation: [id number]   DG C-Arm 1-60 Min Result Date: 05/31/2024 CLINICAL DATA:  Port-A-Cath insertion. EXAM: DG C-ARM 1-60 MIN FLUOROSCOPY: Fluoroscopy Time:  20 seconds Radiation Exposure Index (if provided by the fluoroscopic device): 1.4 mGy Number of Acquired Spot Images: 1 COMPARISON:  None Available. FINDINGS: Single fluoroscopic spot view of the chest submitted from the operating room. Right-sided Port-A-Cath in place with tip overlying the SVC. IMPRESSION: Procedural  fluoroscopy. Electronically Signed   By: Andrea Gasman M.D.   On: 05/31/2024 12:53   ECHOCARDIOGRAM COMPLETE Result Date: 05/13/2024    ECHOCARDIOGRAM REPORT   Patient Name:   Hannah [name] Date of Exam: 05/13/2024 Medical Rec #:  [id number]         Height:       60.0 in Accession #:    [id number]        Weight:       135.7 lb Date of Birth:  [birth year]         BSA:          1.583 m Patient Age:    63 years          BP:           118/68 mmHg Patient Gender: F                 HR:           91 bpm. Exam Location:  Outpatient Procedure: 2D Echo, Cardiac Doppler and Color Doppler (Both Spectral and Color  Flow Doppler were utilized during procedure). Indications:    Chemo Z09  History:        Patient has no prior history of Echocardiogram examinations.  Sonographer:    Jayson Gaskins Referring Phys: [id number] PRAVEENA IRUKU IMPRESSIONS  1. Left ventricular ejection fraction, by estimation, is 65 to 70%. The left ventricle has normal function. The left ventricle has no regional wall motion abnormalities. Left ventricular diastolic parameters were normal.  2. Right ventricular systolic function is normal. The right ventricular size is normal. Tricuspid regurgitation signal is inadequate for assessing PA pressure.  3. The mitral valve is grossly normal. Trivial mitral valve regurgitation. No evidence of mitral stenosis.  4. The aortic valve has an indeterminant number of cusps. Aortic valve regurgitation is not visualized. No aortic stenosis is present.  5. The inferior vena cava is normal in size with greater than 50% respiratory variability, suggesting right atrial pressure of 3 mmHg. FINDINGS  Left Ventricle: Left ventricular ejection fraction, by estimation, is 65 to 70%. The left ventricle has normal function. The left ventricle has no regional wall motion abnormalities. The left ventricular internal cavity size was normal in size. There is  no left ventricular hypertrophy. Left ventricular diastolic  parameters were normal. Right Ventricle: The right ventricular size is normal. No increase in right ventricular wall thickness. Right ventricular systolic function is normal. Tricuspid regurgitation signal is inadequate for assessing PA pressure. Left Atrium: Left atrial size was normal in size. Right Atrium: Right atrial size was normal in size. Pericardium: There is no evidence of pericardial effusion. Mitral Valve: The mitral valve is grossly normal. Trivial mitral valve regurgitation. No evidence of mitral valve stenosis. Tricuspid Valve: The tricuspid valve is grossly normal. Tricuspid valve regurgitation is trivial. No evidence of tricuspid stenosis. Aortic Valve: The aortic valve has an indeterminant number of cusps. Aortic valve regurgitation is not visualized. No aortic stenosis is present. Aortic valve mean gradient measures 7.0 mmHg. Aortic valve peak gradient measures 9.5 mmHg. Aortic valve area, by VTI measures 2.02 cm. Pulmonic Valve: The pulmonic valve was grossly normal. Pulmonic valve regurgitation is trivial. No evidence of pulmonic stenosis. Aorta: The aortic root and ascending aorta are structurally normal, with no evidence of dilitation. Venous: The inferior vena cava is normal in size with greater than 50% respiratory variability, suggesting right atrial pressure of 3 mmHg. IAS/Shunts: The atrial septum is grossly normal.  LEFT VENTRICLE PLAX 2D LVIDd:         3.40 cm   Diastology LVIDs:         2.30 cm   LV e' medial:    9.79 cm/s LV PW:         0.90 cm   LV E/e' medial:  6.7 LV IVS:        1.00 cm   LV e' lateral:   12.90 cm/s LVOT diam:     1.70 cm   LV E/e' lateral: 5.1 LV SV:         67 LV SV Index:   42 LVOT Area:     2.27 cm  RIGHT VENTRICLE RV S prime:     13.40 cm/s TAPSE (M-mode): 2.1 cm LEFT ATRIUM             Index        RIGHT ATRIUM          Index LA Vol (A2C):   31.5 ml 19.90 ml/m  RA Area:     8.61 cm LA Vol (A4C):  30.4 ml 19.20 ml/m  RA Volume:   13.30 ml 8.40 ml/m LA  Biplane Vol: 30.7 ml 19.39 ml/m  AORTIC VALVE AV Area (Vmax):    2.14 cm AV Area (Vmean):   2.03 cm AV Area (VTI):     2.02 cm AV Vmax:           154.00 cm/s AV Vmean:          125.000 cm/s AV VTI:            0.330 m AV Peak Grad:      9.5 mmHg AV Mean Grad:      7.0 mmHg LVOT Vmax:         145.00 cm/s LVOT Vmean:        112.000 cm/s LVOT VTI:          0.294 m LVOT/AV VTI ratio: 0.89  AORTA Ao Root diam: 2.70 cm MITRAL VALVE MV Area (PHT): 3.19 cm    SHUNTS MV Decel Time: 238 msec    Systemic VTI:  0.29 m MV E velocity: 65.50 cm/s  Systemic Diam: 1.70 cm MV A velocity: 84.40 cm/s MV E/A ratio:  0.78 Darryle Decent MD Electronically signed by Darryle Decent MD Signature Date/Time: 05/13/2024/12:48:45 PM    Final     ASSESSMENT & PLAN Hannah [name] is a 64 y.o. female who presents to the clinic for continued management of right breast cancer.   #Stage IIA triple negative invasive ductal carcinoma of the right breast  --Underwent right lumpectomy on 05/01/2024. Pathology revealed high-grade stage IIA triple negative invasive ductal carcinoma with negative margins and sentinel lymph node. Recommended ACT regimen due to aggressive tumor biology and good health. Prognosis favorable with anticipated cure rate >80%.  --Started Cycle 1, Day 1 of AC chemotherapy on 05/29/2024.  PLAN: --Due to start Cycle 2, Day 1 of AC chemotherapy today --Labs today show WBC 17.6, Hgb 11.8, Plt 279K, creatinine and LFTs are normal --Proceed with treatment without any dose modifications --Will arrange for IV fluids over 1 hour next week to help minimize dehydration. --RTC in 2 weeks with labs and follow up prior to Cycle 3, Day 1.   #Surgical incision: --Continue to heal with no signs of infection.  --Monitor closely  #Nausea: --Advised to take zofran /compazine  as needed  #Appetite loss: --Encouraged to eat small, frequent meals throughout the day and supplement with protein shakes     No orders of the  defined types were placed in this encounter.   All questions were answered. The patient knows to call the clinic with any problems, questions or concerns.  I have spent a total of 30 minutes minutes of face-to-face and non-face-to-face time, preparing to see the patient,performing a medically appropriate examination, counseling and educating the patient, ordering tests/procedures, documenting clinical information in the electronic health record, independently interpreting results and communicating results to the patient, and care coordination.   Johnston Police, PA-C Department of Hematology/Oncology [facility] Phone: [phone] "

## 2024-06-11 ENCOUNTER — Inpatient Hospital Stay: Admitting: Licensed Clinical Social Worker

## 2024-06-11 ENCOUNTER — Inpatient Hospital Stay

## 2024-06-11 ENCOUNTER — Inpatient Hospital Stay: Admitting: Physician Assistant

## 2024-06-11 VITALS — BP 130/60 | HR 93 | Temp 97.3°F | Resp 17 | Ht 60.0 in | Wt 134.3 lb

## 2024-06-11 VITALS — BP 100/60 | HR 87 | Resp 18

## 2024-06-11 DIAGNOSIS — R110 Nausea: Secondary | ICD-10-CM

## 2024-06-11 DIAGNOSIS — R630 Anorexia: Secondary | ICD-10-CM | POA: Diagnosis not present

## 2024-06-11 DIAGNOSIS — Z171 Estrogen receptor negative status [ER-]: Secondary | ICD-10-CM | POA: Diagnosis not present

## 2024-06-11 DIAGNOSIS — C50311 Malignant neoplasm of lower-inner quadrant of right female breast: Secondary | ICD-10-CM

## 2024-06-11 DIAGNOSIS — Z5111 Encounter for antineoplastic chemotherapy: Secondary | ICD-10-CM | POA: Diagnosis not present

## 2024-06-11 LAB — CBC WITH DIFFERENTIAL (CANCER CENTER ONLY)
Abs Immature Granulocytes: 2.46 K/uL — ABNORMAL HIGH (ref 0.00–0.07)
Basophils Absolute: 0.1 K/uL (ref 0.0–0.1)
Basophils Relative: 1 %
Eosinophils Absolute: 0 K/uL (ref 0.0–0.5)
Eosinophils Relative: 0 %
HCT: 34.8 % — ABNORMAL LOW (ref 36.0–46.0)
Hemoglobin: 11.8 g/dL — ABNORMAL LOW (ref 12.0–15.0)
Immature Granulocytes: 14 %
Lymphocytes Relative: 18 %
Lymphs Abs: 3.1 K/uL (ref 0.7–4.0)
MCH: 27.6 pg (ref 26.0–34.0)
MCHC: 33.9 g/dL (ref 30.0–36.0)
MCV: 81.3 fL (ref 80.0–100.0)
Monocytes Absolute: 1.2 K/uL — ABNORMAL HIGH (ref 0.1–1.0)
Monocytes Relative: 7 %
Neutro Abs: 10.7 K/uL — ABNORMAL HIGH (ref 1.7–7.7)
Neutrophils Relative %: 60 %
Platelet Count: 279 K/uL (ref 150–400)
RBC: 4.28 MIL/uL (ref 3.87–5.11)
RDW: 14 % (ref 11.5–15.5)
Smear Review: NORMAL
WBC Count: 17.6 K/uL — ABNORMAL HIGH (ref 4.0–10.5)
nRBC: 0.3 % — ABNORMAL HIGH (ref 0.0–0.2)

## 2024-06-11 LAB — CMP (CANCER CENTER ONLY)
ALT: 25 U/L (ref 0–44)
AST: 17 U/L (ref 15–41)
Albumin: 4.5 g/dL (ref 3.5–5.0)
Alkaline Phosphatase: 139 U/L — ABNORMAL HIGH (ref 38–126)
Anion gap: 14 (ref 5–15)
BUN: 21 mg/dL (ref 8–23)
CO2: 27 mmol/L (ref 22–32)
Calcium: 9.6 mg/dL (ref 8.9–10.3)
Chloride: 98 mmol/L (ref 98–111)
Creatinine: 0.79 mg/dL (ref 0.44–1.00)
GFR, Estimated: >60 mL/min
Glucose, Bld: 179 mg/dL — ABNORMAL HIGH (ref 70–99)
Potassium: 3.6 mmol/L (ref 3.5–5.1)
Sodium: 139 mmol/L (ref 135–145)
Total Bilirubin: <0.2 mg/dL (ref 0.0–1.2)
Total Protein: 7.5 g/dL (ref 6.5–8.1)

## 2024-06-11 MED ORDER — DEXAMETHASONE SOD PHOSPHATE PF 10 MG/ML IJ SOLN
10.0000 mg | Freq: Once | INTRAMUSCULAR | Status: AC
  Administered 2024-06-11: 10 mg via INTRAVENOUS
  Filled 2024-06-11: qty 1

## 2024-06-11 MED ORDER — SODIUM CHLORIDE 0.9 % IV SOLN
150.0000 mg | Freq: Once | INTRAVENOUS | Status: AC
  Administered 2024-06-11: 150 mg via INTRAVENOUS
  Filled 2024-06-11: qty 150

## 2024-06-11 MED ORDER — DOXORUBICIN HCL CHEMO IV INJECTION 2 MG/ML
60.0000 mg/m2 | Freq: Once | INTRAVENOUS | Status: AC
  Administered 2024-06-11: 96 mg via INTRAVENOUS
  Filled 2024-06-11: qty 48

## 2024-06-11 MED ORDER — SODIUM CHLORIDE 0.9 % IV SOLN
600.0000 mg/m2 | Freq: Once | INTRAVENOUS | Status: AC
  Administered 2024-06-11: 1000 mg via INTRAVENOUS
  Filled 2024-06-11: qty 50

## 2024-06-11 MED ORDER — PALONOSETRON HCL INJECTION 0.25 MG/5ML
0.2500 mg | Freq: Once | INTRAVENOUS | Status: AC
  Administered 2024-06-11: 0.25 mg via INTRAVENOUS
  Filled 2024-06-11: qty 5

## 2024-06-11 MED ORDER — SODIUM CHLORIDE 0.9 % IV SOLN: INTRAVENOUS | Status: DC

## 2024-06-11 NOTE — Patient Instructions (Signed)
 [facility] - A DEPT OF [facility]  Discharge Instructions: Thank you for choosing [facility] to provide your oncology and hematology care.   If you have a lab appointment with the Cancer Center, please go directly to the Cancer Center and check in at the registration area.   Wear comfortable clothing and clothing appropriate for easy access to any Portacath or PICC line.   We strive to give you quality time with your provider. You may need to reschedule your appointment if you arrive late (15 or more minutes).  Arriving late affects you and other patients whose appointments are after yours.  Also, if you miss three or more appointments without notifying the office, you may be dismissed from the clinic at the provider's discretion.      For prescription refill requests, have your pharmacy contact our office and allow 72 hours for refills to be completed.    Today you received the following chemotherapy and/or immunotherapy agents: doxorubicin and cyclophosphamide      To help prevent nausea and vomiting after your treatment, we encourage you to take your nausea medication as directed.  BELOW ARE SYMPTOMS THAT SHOULD BE REPORTED IMMEDIATELY: *FEVER GREATER THAN 100.4 F (38 C) OR HIGHER *CHILLS OR SWEATING *NAUSEA AND VOMITING THAT IS NOT CONTROLLED WITH YOUR NAUSEA MEDICATION *UNUSUAL SHORTNESS OF BREATH *UNUSUAL BRUISING OR BLEEDING *URINARY PROBLEMS (pain or burning when urinating, or frequent urination) *BOWEL PROBLEMS (unusual diarrhea, constipation, pain near the anus) TENDERNESS IN MOUTH AND THROAT WITH OR WITHOUT PRESENCE OF ULCERS (sore throat, sores in mouth, or a toothache) UNUSUAL RASH, SWELLING OR PAIN  UNUSUAL VAGINAL DISCHARGE OR ITCHING   Items with * indicate a potential emergency and should be followed up as soon as possible or go to the Emergency Department if any problems should occur.  Please show the CHEMOTHERAPY ALERT  CARD or IMMUNOTHERAPY ALERT CARD at check-in to the Emergency Department and triage nurse.  Should you have questions after your visit or need to cancel or reschedule your appointment, please contact [facility] - A DEPT OF [facility]  Dept: [phone]  and follow the prompts.  Office hours are 8:00 a.m. to 4:30 p.m. Monday - Friday. Please note that voicemails left after 4:00 p.m. may not be returned until the following business day.  We are closed weekends and major holidays. You have access to a nurse at all times for urgent questions. Please call the main number to the clinic Dept: [phone] and follow the prompts.   For any non-urgent questions, you may also contact your provider using MyChart. We now offer e-Visits for anyone 50 and older to request care online for non-urgent symptoms. For details visit [email or web address].   Also download the MyChart app! Go to the app store, search "MyChart", open the app, select [facility], and log in with your MyChart username and password.

## 2024-06-11 NOTE — Progress Notes (Signed)
 [facility] CSW Progress Note  Visual Merchandiser met with patient to follow-up on need for community resources and emotional support. Pt's son present with her in infusion.    Interventions: Notified patient of approval for Hannah [name]  Patient reports coping well otherwise. She did better post-chemo than she expected and is hopeful that it is similar this time.      Follow Up Plan:  Patient will contact CSW with any support or resource needs    Hannah [name] E Dylann Layne, LCSW Clinical Social Worker [facility]    Patient is participating in a Managed Medicaid Plan:  Yes

## 2024-06-13 ENCOUNTER — Inpatient Hospital Stay

## 2024-06-13 VITALS — BP 115/60 | HR 74 | Temp 98.4°F | Resp 17

## 2024-06-13 DIAGNOSIS — C50311 Malignant neoplasm of lower-inner quadrant of right female breast: Secondary | ICD-10-CM

## 2024-06-13 DIAGNOSIS — Z5111 Encounter for antineoplastic chemotherapy: Secondary | ICD-10-CM | POA: Diagnosis not present

## 2024-06-13 MED ORDER — PEGFILGRASTIM-CBQV 6 MG/0.6ML ~~LOC~~ SOSY
6.0000 mg | PREFILLED_SYRINGE | Freq: Once | SUBCUTANEOUS | Status: AC
  Administered 2024-06-13: 6 mg via SUBCUTANEOUS
  Filled 2024-06-13: qty 0.6

## 2024-06-17 NOTE — Therapy (Incomplete)
 " OUTPATIENT PHYSICAL THERAPY BREAST CANCER POST OP FOLLOW UP   Patient Name: Hannah [name] MRN: [id number] DOB:06/01/1960, 64 y.o., female Today's Date: 06/17/2024  END OF SESSION:   Past Medical History:  Diagnosis Date   Arthritis    feet   Cancer (HCC)    Breast   Diabetes mellitus    type 2   Dysrhythmia    Family history of adverse reaction to anesthesia    father: post-op nausea and vomiting   Hypertension    Osteopenia    Wears glasses    Wears partial dentures    partial upper   Past Surgical History:  Procedure Laterality Date   ABDOMINAL HYSTERECTOMY     in her 30s   BREAST BIOPSY Right    BREAST SURGERY  2 different occasions, over 5 years ago   cyst removal BIL   CHOLECYSTECTOMY N/A 12/25/2016   Procedure: LYSIS OF ADHESIONS, LAPAROSCOPIC CHOLECYSTECTOMY WITH INTRAOPERATIVE CHOLANGIOGRAM;  Surgeon: Tanda Locus, MD;  Location: [facility];  Service: General;  Laterality: N/A;   COLON SURGERY  at least 6 yrs ago per patient   polyps and fissures    DIAGNOSTIC LAPAROSCOPY  over 20 years ago   exploratory lap   FOOT SURGERY  05/23/1983   left   INSERTION OF MESH N/A 11/13/2014   Procedure: INSERTION OF MESH;  Surgeon: Lynda Leos, MD;  [facility]: [facility];  Service: General;  Laterality: N/A;   MICROLARYNGOSCOPY N/A 05/19/2014   Procedure: MICROLARYNGOSCOPY WITH EXCISION OF VOCAL CORD LESION;  Surgeon: Lonni FORBES Angle, MD;  Location: [facility];  Service: ENT;  Laterality: N/A;   MICROLARYNGOSCOPY WITH CO2 LASER AND EXCISION OF VOCAL CORD LESION N/A 09/05/2019   Procedure: MICROLARYNGOSCOPY WITH EXCISION OF VOCAL CORD LESION;  Surgeon: Angle Lonni FORBES, MD;  Location: [facility];  Service: ENT;  Laterality: N/A;   PORTACATH PLACEMENT Right 05/28/2024   Procedure: INSERTION, TUNNELED CENTRAL VENOUS DEVICE, WITH PORT;  Surgeon: Ebbie Cough, MD;  Location:  SURGERY CENTER;  Service: General;  Laterality:  Right;  PORT PLACEMENT WITH ULTRASOUND GUIDANCE   rectal fissure     SENTINEL NODE BIOPSY Right 05/01/2024   Procedure: BIOPSY, LYMPH NODE, SENTINEL;  Surgeon: Ebbie Cough, MD;  [facility];  Service: General;  Laterality: Right;   SHOULDER SURGERY Right    UMBILICAL HERNIA REPAIR N/A 11/13/2014   Procedure: LAPAROSCOPIC UMBILICAL HERNIA REPAIR WITH MESH;  Surgeon: Lynda Leos, MD;  [facility];  Service: General;  Laterality: N/A;   Patient Active Problem List   Diagnosis Date Noted   Genetic testing 05/01/2024   Malignant neoplasm of lower-inner quadrant of right breast of female, estrogen receptor negative (HCC) 04/18/2024   Acute cholecystitis due to biliary calculus 12/25/2016   LOCALIZED SUPERFICIAL SWELLING MASS OR LUMP 03/26/2007   VAGINAL DISCHARGE 10/05/2006   ABDOMINAL PAIN, CHRONIC 06/08/2006   TOBACCO ABUSE 04/27/2006   HYPERTENSION 04/27/2006   HEMORRHOIDS 04/27/2006   FISTULA, ANAL 04/27/2006   PALPITATIONS 04/27/2006   BREAST CYST, HX OF 04/27/2006      REFERRING PROVIDER: Dr. Cough Ebbie  REFERRING DIAG: right Breast Cancer   THERAPY DIAG:  No diagnosis found.  Rationale for Evaluation and Treatment: Rehabilitation  ONSET DATE: 03/27/2024  SUBJECTIVE:  SUBJECTIVE STATEMENT: ***  PERTINENT HISTORY:  Patient was diagnosed on 03/27/2024 with right DCIS with probable invasive disease. It measures 2.4 cm and is located in the lower inner quadrant. It is triple negative with a Ki67 of 5%. She is s/p a  right lumpectomy on 05/01/2024. Pathology revealed high-grade stage IIA triple negative invasive ductal carcinoma with negative margins and sentinel lymph node. She is currently undergoing adjuvant chemotherapy.   She had a recent right shoulder scope in 12/2023  and is still functionally limited due to that.   PATIENT GOALS:  Reassess how my recovery is going related to arm function, pain, and swelling.  PAIN:  Are you having pain? {OPRCPAIN:27236}  PRECAUTIONS: Recent Surgery, right UE Lymphedema risk,   RED FLAGS: None   ACTIVITY LEVEL / LEISURE: ***   OBJECTIVE:   PATIENT SURVEYS:  QUICK DASH: ***  OBSERVATIONS: ***  POSTURE:  Forward head and rounded shoulders  LYMPHEDEMA ASSESSMENT:   UPPER EXTREMITY AROM/PROM:   A/PROM RIGHT  - all painful and stiffdue to recent shoulder scope   Eval 04/23/2024   RIGHT 06/18/2024  Shoulder extension 30   Shoulder flexion 125   Shoulder abduction 113   Shoulder internal rotation 24   Shoulder external rotation 65                           (Blank rows = not tested)   A/PROM LEFT   eval  Shoulder extension 44  Shoulder flexion 145  Shoulder abduction 153  Shoulder internal rotation 60  Shoulder external rotation 85                          (Blank rows = not tested)   CERVICAL AROM: All within normal limits   UPPER EXTREMITY STRENGTH: Not tested due to recent shoulder surgery and pain   LYMPHEDEMA ASSESSMENTS (in cm):    LANDMARK RIGHT   eval RIGHT 06/18/2024  10 cm proximal to olecranon process from proximal aspect of olecranon 26.9   Olecranon process 22.8   10 cm proximal to ulnar styloid process from proximal aspect of styloid process 20.9   Just distal to ulnar styloid process 14.7   Across hand at thumb web space 17.5   At base of 2nd digit 5.7   (Blank rows = not tested)   LANDMARK LEFT   eval  10 cm proximal to olecranon process from proximal aspect of olecranon 25.9   Olecranon process 21.8  10 cm proximal to ulnar styloid process from proximal aspect of styloid process 20.4  Just distal to ulnar styloid process 13.9  Across hand at thumb web space 16.9  At base of 2nd digit 5.3  (Blank rows = not tested)     Surgery type/Date: right lumpectomy with  SLNB on 05/01/2024 Number of lymph nodes removed: 0+/1 Current/past treatment (chemo, radiation, hormone therapy): adjuvant chemotherapy presently Other symptoms:  Heaviness/tightness {yes/no:20286} Pain {yes/no:20286} Pitting edema {yes/no:20286} Infections {yes/no:20286} Decreased scar mobility {yes/no:20286} Stemmer sign {yes/no:20286}  PATIENT EDUCATION:  Education details: *** Person educated: {Person educated:25204} Education method: {Education Method:25205} Education comprehension: {Education Comprehension:25206}  HOME EXERCISE PROGRAM: Reviewed previously given post op HEP. ***  ASSESSMENT:  CLINICAL IMPRESSION: Pt is s/p right lumpectomy with SLNB and 0+/1 LN on 05/01/2024. She is presently undergoing adjuvant chemotherapy for her triple negatice breast cancer.  Pt will benefit from skilled therapeutic intervention to improve on the following deficits:  Decreased knowledge of precautions, impaired UE functional use, pain, decreased ROM, postural dysfunction.   PT treatment/interventions: ADL/Self care home management, {rehab planned interventions:25118::97110-Therapeutic exercises,97530- Therapeutic [phone]- Neuromuscular re-education,97535- Self Rjmz,02859- Manual therapy,Patient/Family education}   GOALS: Goals reviewed with patient? Yes  GOALS MET AT EVAL:  GOALS Name Target Date Goal status  1 Pt will be able to verbalize understanding of pertinent lymphedema risk reduction practices relevant to her dx specifically related to skin care.  Baseline:  No knowledge Eval Achieved at eval  2 Pt will be able to return demo and/or verbalize understanding of the post op HEP related to regaining shoulder ROM. Baseline:  No knowledge Eval Achieved at eval  3 Pt will be able to verbalize understanding of the importance of viewing the post op After Breast CA Class video for further lymphedema risk reduction education and therapeutic exercise.  Baseline:  No  knowledge Eval Achieved at eval   LONG TERM GOALS:  (STG=LTG)  GOALS Name Target Date  Goal status  1 Pt will demonstrate she has regained full shoulder ROM and function post operatively compared to baselines.  Baseline: *** INITIAL  2  *** INITIAL  3  *** INITIAL  4  *** INITIAL     PLAN:  PT FREQUENCY/DURATION: ***  PLAN FOR NEXT SESSION: ***   [facility]  [address], [address]  [place] [address]  [phone]  After Breast Cancer Class Video It is recommended you view the ABC class video to be educated on lymphedema risk reduction. This video lasts for about 30 minutes. It can be viewed on our website here: [email or web address]  Scar massage You can begin gentle scar massage to you incision sites. Gently place one hand on the incision and move the skin (without sliding on the skin) in various directions. Do this for a few minutes and then you can gently massage either coconut oil or vitamin E cream into the scars.  Compression garment You should continue wearing your compression bra until you feel like you no longer have swelling.  Home exercise Program Continue doing the exercises you were given until you feel like you can do them without feeling any tightness at the end.   Walking Program Studies show that 30 minutes of walking per day (fast enough to elevate your heart rate) can significantly reduce the risk of a cancer recurrence. If you can't walk due to other medical reasons, we encourage you to find another activity you could do (like a stationary bike or water exercise).  Posture After breast cancer surgery, people frequently sit with rounded shoulders posture because it puts their incisions on slack and feels better. If you sit like this and scar tissue forms in that position, you can become very tight and have pain sitting or standing with good posture. Try to be aware  of your posture and sit and stand up tall to heal properly.  Follow up PT: It is recommended you return every 3 months for the first 3 years following surgery to be assessed on the SOZO machine for an L-Dex score. This helps prevent clinically significant lymphedema in 95% of patients. These follow up screens are 10 minute appointments that you are not billed for.  Hannah [name], PT 06/17/2024, 5:50 PM  "

## 2024-06-18 ENCOUNTER — Encounter: Payer: Self-pay | Admitting: General Practice

## 2024-06-18 ENCOUNTER — Ambulatory Visit

## 2024-06-18 ENCOUNTER — Inpatient Hospital Stay: Admitting: Nurse Practitioner

## 2024-06-18 ENCOUNTER — Encounter: Payer: Self-pay | Admitting: Nurse Practitioner

## 2024-06-18 ENCOUNTER — Inpatient Hospital Stay

## 2024-06-18 VITALS — BP 114/52 | HR 86 | Temp 97.7°F | Resp 16 | Wt 134.2 lb

## 2024-06-18 DIAGNOSIS — Z171 Estrogen receptor negative status [ER-]: Secondary | ICD-10-CM

## 2024-06-18 DIAGNOSIS — R110 Nausea: Secondary | ICD-10-CM

## 2024-06-18 DIAGNOSIS — Z5111 Encounter for antineoplastic chemotherapy: Secondary | ICD-10-CM | POA: Diagnosis not present

## 2024-06-18 DIAGNOSIS — C50311 Malignant neoplasm of lower-inner quadrant of right female breast: Secondary | ICD-10-CM

## 2024-06-18 LAB — CMP (CANCER CENTER ONLY)
ALT: 19 U/L (ref 0–44)
AST: 12 U/L — ABNORMAL LOW (ref 15–41)
Albumin: 4.3 g/dL (ref 3.5–5.0)
Alkaline Phosphatase: 132 U/L — ABNORMAL HIGH (ref 38–126)
Anion gap: 15 (ref 5–15)
BUN: 27 mg/dL — ABNORMAL HIGH (ref 8–23)
CO2: 24 mmol/L (ref 22–32)
Calcium: 9.5 mg/dL (ref 8.9–10.3)
Chloride: 99 mmol/L (ref 98–111)
Creatinine: 0.79 mg/dL (ref 0.44–1.00)
GFR, Estimated: >60 mL/min
Glucose, Bld: 120 mg/dL — ABNORMAL HIGH (ref 70–99)
Potassium: 3.5 mmol/L (ref 3.5–5.1)
Sodium: 137 mmol/L (ref 135–145)
Total Bilirubin: 0.3 mg/dL (ref 0.0–1.2)
Total Protein: 6.8 g/dL (ref 6.5–8.1)

## 2024-06-18 LAB — CBC WITH DIFFERENTIAL (CANCER CENTER ONLY)
Abs Immature Granulocytes: 0.15 10*3/uL — ABNORMAL HIGH (ref 0.00–0.07)
Basophils Absolute: 0.1 10*3/uL (ref 0.0–0.1)
Basophils Relative: 2 %
Eosinophils Absolute: 0 10*3/uL (ref 0.0–0.5)
Eosinophils Relative: 0 %
HCT: 31.5 % — ABNORMAL LOW (ref 36.0–46.0)
Hemoglobin: 10.9 g/dL — ABNORMAL LOW (ref 12.0–15.0)
Immature Granulocytes: 2 %
Lymphocytes Relative: 25 %
Lymphs Abs: 2 10*3/uL (ref 0.7–4.0)
MCH: 27.9 pg (ref 26.0–34.0)
MCHC: 34.6 g/dL (ref 30.0–36.0)
MCV: 80.6 fL (ref 80.0–100.0)
Monocytes Absolute: 1.1 10*3/uL — ABNORMAL HIGH (ref 0.1–1.0)
Monocytes Relative: 14 %
Neutro Abs: 4.5 10*3/uL (ref 1.7–7.7)
Neutrophils Relative %: 57 %
Platelet Count: 196 10*3/uL (ref 150–400)
RBC: 3.91 MIL/uL (ref 3.87–5.11)
RDW: 14.1 % (ref 11.5–15.5)
Smear Review: NORMAL
WBC Count: 7.8 10*3/uL (ref 4.0–10.5)
nRBC: 0 % (ref 0.0–0.2)

## 2024-06-18 MED ORDER — ONDANSETRON HCL 4 MG/2ML IJ SOLN
8.0000 mg | Freq: Once | INTRAMUSCULAR | Status: AC
  Administered 2024-06-18: 8 mg via INTRAVENOUS
  Filled 2024-06-18: qty 4

## 2024-06-18 MED ORDER — SODIUM CHLORIDE 0.9 % IV SOLN: INTRAVENOUS | Status: AC

## 2024-06-18 NOTE — Progress Notes (Signed)
 "     [facility]   Telephone:([phone] Fax:([phone]) [phone]    Patient Care Team: Benjamine Aland, MD as PCP - General (Family Medicine) Tyree Nanetta SAILOR, RN as Oncology Nurse Navigator Ebbie Cough, MD as Consulting Physician (General Surgery) Loretha Ash, MD as Consulting Physician (Hematology and Oncology) Dewey Rush, MD as Consulting Physician (Radiation Oncology)   CHIEF COMPLAINT: [facility] for nausea, shortness of breath   CURRENT THERAPY: AC chemo for breast cancer   INTERVAL HISTORY Hannah [name] presents to [facility], seen in infusion room receiving IV fluids. She completed cycle 2 AC last week. She feels tired, crummy and stomach is unsettled, no vomiting. Hasn't taken anti-emetics like she did with cycle 1 because she doesn't think what she was feeling was nausea and doesn't like to take meds. BGs have been higher due to steroids, which is concerning her. Has exertional dyspnea and chest discomfort when she reaches the stop of her stairs at home.   ROS  All other systems reviewed and negative   Past Medical History:  Diagnosis Date   Arthritis    feet   Cancer (HCC)    Breast   Diabetes mellitus    type 2   Dysrhythmia    Family history of adverse reaction to anesthesia    father: post-op nausea and vomiting   Hypertension    Osteopenia    Wears glasses    Wears partial dentures    partial upper     Past Surgical History:  Procedure Laterality Date   ABDOMINAL HYSTERECTOMY     in her 30s   BREAST BIOPSY Right    BREAST SURGERY  2 different occasions, over 5 years ago   cyst removal BIL   CHOLECYSTECTOMY N/A 12/25/2016   Procedure: LYSIS OF ADHESIONS, LAPAROSCOPIC CHOLECYSTECTOMY WITH INTRAOPERATIVE CHOLANGIOGRAM;  Surgeon: Tanda Locus, MD;  Location: [facility];  Service: General;  Laterality: N/A;   COLON SURGERY  at least 6 yrs ago per patient   polyps and fissures    DIAGNOSTIC LAPAROSCOPY  over 20 years ago   exploratory lap   FOOT  SURGERY  05/23/1983   left   INSERTION OF MESH N/A 11/13/2014   Procedure: INSERTION OF MESH;  Surgeon: Lynda Leos, MD;  [facility];  Service: General;  Laterality: N/A;   MICROLARYNGOSCOPY N/A 05/19/2014   Procedure: MICROLARYNGOSCOPY WITH EXCISION OF VOCAL CORD LESION;  Surgeon: Lonni FORBES Angle, MD;  Location: [facility];  Service: ENT;  Laterality: N/A;   MICROLARYNGOSCOPY WITH CO2 LASER AND EXCISION OF VOCAL CORD LESION N/A 09/05/2019   Procedure: MICROLARYNGOSCOPY WITH EXCISION OF VOCAL CORD LESION;  Surgeon: Angle Lonni FORBES, MD;  Location: [facility];  Service: ENT;  Laterality: N/A;   PORTACATH PLACEMENT Right 05/28/2024   Procedure: INSERTION, TUNNELED CENTRAL VENOUS DEVICE, WITH PORT;  Surgeon: Ebbie Cough, MD;  Location: [facility];  Service: General;  Laterality: Right;  PORT PLACEMENT WITH ULTRASOUND GUIDANCE   rectal fissure     SENTINEL NODE BIOPSY Right 05/01/2024   Procedure: BIOPSY, LYMPH NODE, SENTINEL;  Surgeon: Ebbie Cough, MD;  Location: [facility];  Service: General;  Laterality: Right;   SHOULDER SURGERY Right    UMBILICAL HERNIA REPAIR N/A 11/13/2014   Procedure: LAPAROSCOPIC UMBILICAL HERNIA REPAIR WITH MESH;  Surgeon: Lynda Leos, MD;  [facility]: [facility];  Service: General;  Laterality: N/A;     Outpatient Encounter Medications as of 06/18/2024  Medication Sig   acetaminophen  (  TYLENOL ) 500 MG tablet Take 500-1,000 mg by mouth every 6 (six) hours as needed (pain.).   amLODipine  (NORVASC ) 5 MG tablet Take 5 mg by mouth in the morning.   chlorthalidone (HYGROTON) 25 MG tablet Take 25 mg by mouth in the morning.   cholecalciferol (VITAMIN D3) 25 MCG (1000 UNIT) tablet Take 1,000 Units by mouth in the morning.   cyanocobalamin (VITAMIN B12) 1000 MCG tablet Take 1,000 mcg by mouth in the morning.   dexamethasone  (DECADRON ) 4 MG tablet Take 2 tablets (8 mg total) by mouth daily for 3 days. Start the  day after doxorubicin /cyclophosphamide  chemotherapy. Take with food.   lidocaine  (LIDODERM ) 5 % Place 1 patch onto the skin daily. Remove & Discard patch within 12 hours or as directed by MD   lisinopril  (PRINIVIL ,ZESTRIL ) 40 MG tablet Take 40 mg by mouth in the morning.   magic mouthwash (lidocaine , diphenhydrAMINE , alum & mag hydroxide) suspension Swish and spit 5 mLs 3 (three) times daily as needed.   metFORMIN  (GLUCOPHAGE ) 500 MG tablet Take 500 mg by mouth 2 (two) times daily with a meal.   methocarbamol  (ROBAXIN ) 500 MG tablet Take 1 tablet (500 mg total) by mouth every 8 (eight) hours as needed for muscle spasms.   naproxen  (NAPROSYN ) 500 MG tablet Take 1 tablet (500 mg total) by mouth 2 (two) times daily.   ondansetron  (ZOFRAN ) 8 MG tablet Take 1 tab (8 mg) by mouth every 8 hrs as needed for nausea/vomiting. Start third day after doxorubicin /cyclophosphamide  chemotherapy.   oxyCODONE  (OXY IR/ROXICODONE ) 5 MG immediate release tablet Take 1 tablet (5 mg total) by mouth every 6 (six) hours as needed.   prochlorperazine  (COMPAZINE ) 10 MG tablet Take 1 tablet (10 mg total) by mouth every 6 (six) hours as needed for nausea or vomiting.   Facility-Administered Encounter Medications as of 06/18/2024  Medication   0.9 %  sodium chloride  infusion   [COMPLETED] ondansetron  (ZOFRAN ) injection 8 mg     There were no vitals filed for this visit. There is no height or weight on file to calculate BMI.    PHYSICAL EXAM GENERAL:alert, no distress and comfortable SKIN: no rash  EYES: sclera clear LUNGS: clear with normal breathing effort HEART: regular rate & rhythm, no lower extremity edema ABDOMEN: abdomen soft, non-tender and normal bowel sounds NEURO: alert & oriented x 3 with fluent speech, no focal motor/sensory deficits    CBC    Latest Ref Rng & Units 06/18/2024    2:04 PM 06/11/2024    8:06 AM 06/07/2024    1:51 AM  CBC  WBC 4.0 - 10.5 K/uL 7.8  17.6  19.2   Hemoglobin 12.0 - 15.0  g/dL 89.0  88.1  87.4   Hematocrit 36.0 - 46.0 % 31.5  34.8  36.5   Platelets 150 - 400 K/uL 196  279  269       CMP     Latest Ref Rng & Units 06/18/2024    2:04 PM 06/11/2024    8:06 AM 06/07/2024    1:51 AM  CMP  Glucose 70 - 99 mg/dL 879  820  837   BUN 8 - 23 mg/dL 27  21  16    Creatinine 0.44 - 1.00 mg/dL 9.20  9.20  9.10   Sodium 135 - 145 mmol/L 137  139  137   Potassium 3.5 - 5.1 mmol/L 3.5  3.6  4.2   Chloride 98 - 111 mmol/L 99  98  98  CO2 22 - 32 mmol/L 24  27  25    Calcium 8.9 - 10.3 mg/dL 9.5  9.6  9.6   Total Protein 6.5 - 8.1 g/dL 6.8  7.5  7.6   Total Bilirubin 0.0 - 1.2 mg/dL 0.3  <9.7  <9.7   Alkaline Phos 38 - 126 U/L 132  139  124   AST 15 - 41 U/L 12  17  22    ALT 0 - 44 U/L 19  25  16        ASSESSMENT & PLAN:  Nausea, exertional dyspnea, chest pain -Following cycle 2 AC last given 1/21, has not take oral anti-emetics at home -Will give IVF + IV zofran  8 mg today, encouraged to take home meds prophylactically for the first week after chemo then PRN -Hgb 10.8 -HR WNL, O2 remained 98%, and no chest pain on ambulation  -Will monitor for recurrence/worsening   Stage IIA triple negative invasive ductal carcinoma of the right breast  -S/p right lumpectomy on 05/01/2024. Pathology revealed high-grade stage IIA triple negative invasive ductal carcinoma with negative margins and sentinel lymph node.  -Adjuvant chemo recommended, AC-T regimen due to aggressive tumor biology and good health. Prognosis favorable with anticipated cure rate >80%.  -Cycle 1, Day 1 of AC 05/29/2024. Cycle 2 day 1 of AC 06/11/2024    PLAN: -IVF + IV zofran  -Oral anti-emetics and supportive care at home -Monitor DOE/CP and call for new/worsening Sx, likely 2/2 chemo related fatigue      All questions were answered. The patient knows to call the clinic with any problems, questions or concerns. No barriers to learning were detected.   Ashe Gago K Cartina Brousseau, NP 06/18/2024   "

## 2024-06-18 NOTE — Progress Notes (Signed)
 [facility] Spiritual Care Note  Referred by Nursing for additional layer of support. Met Ms Aguinaga and her mother in infusion, introducing Spiritual Care as part of their support team. Brought handmade quilt as a tangible gesture of care and encouragement, as well as Spiritual Care brochure with direct contact information. Ms Goonan named that she is having a tough day, and she plans to contact chaplain for follow-up support.  [address] Olam Corrigan, [place], [facility] Pager [phone] Voicemail [phone]

## 2024-06-18 NOTE — Progress Notes (Signed)
 Patient reported to infusion today for IVF. Pt had c/o nausea not relieved with antiemetics. Pt informed this RN that oral intake is down d/t decreased appetite and overall Pt feels super crummy. Pt also informed this RN that she has had multiple instances of chest discomfort at home after minimal exercise (walking the stairs at her house) and her SOB is worse. Pt reports no CP today and HR ranging from 80s-mid 90s upon VS check. This RN made Dr. Loretha aware. Dr. Loretha recommended Pt be seen by [facility]. This RN made Lacie B NP aware. Lacie B NP assessed Pt chairside. This RN assessed Pt's O2 during ambulation, HR noted to remain btw 95-97 and O2 98%. No CP noted w/ ambulation.

## 2024-06-18 NOTE — Patient Instructions (Signed)
Nausea, Adult Nausea is feeling like you may vomit. Feeling like you may vomit is usually not serious, but it may be an early sign of a more serious medical problem. Vomiting is when stomach contents forcefully come out of your mouth. If you vomit, or if you are not able to drink enough fluids, you may not have enough water in your body (get dehydrated). If you do not have enough water in your body, you may: Feel tired. Feel thirsty. Have a dry mouth. Have cracked lips. Pee (urinate) less often. Older adults and people who have other diseases or a weak body defense system (immune system) have a higher risk of not having enough water in the body. The main goals of treating this condition are: To relieve your nausea. To ensure your nausea occurs less often. To prevent vomiting and losing too much fluid. Follow these instructions at home: Watch your symptoms for any changes. Tell your doctor about them. Eating and drinking     Take an ORS (oral rehydration solution). This is a drink that is sold at pharmacies and stores. Drink clear fluids in small amounts as you are able. These include: Water. Ice chips. Fruit juice that has water added (diluted fruit juice). Low-calorie sports drinks. Eat bland, easy-to-digest foods in small amounts as you are able, such as: Bananas. Applesauce. Rice. Low-fat (lean) meats. Toast. Crackers. Avoid drinking fluids that have a lot of sugar or caffeine in them. This includes energy drinks, sports drinks, and soda. Avoid alcohol. Avoid spicy or fatty foods. General instructions Take over-the-counter and prescription medicines only as told by your doctor. Rest at home while you get better. Drink enough fluid to keep your pee (urine) pale yellow. Take slow and deep breaths when you feel like you may vomit. Avoid food or things that have strong smells. Wash your hands often with soap and water for at least 20 seconds. If you cannot use soap and water,  use hand sanitizer. Make sure that everyone in your home washes their hands well and often. Keep all follow-up visits. Contact a doctor if: You feel worse. You feel like you may vomit and this lasts for more than 2 days. You vomit. You are not able to drink fluids without vomiting. You have new symptoms. You have a fever. You have a headache. You have muscle cramps. You have a rash. You have pain while peeing. You feel light-headed or dizzy. Get help right away if: You have pain in your chest, neck, arm, or jaw. You feel very weak or you faint. You have vomit that is bright red or looks like coffee grounds. You have bloody or black poop (stools) or poop that looks like tar. You have a very bad headache, a stiff neck, or both. You have very bad pain, cramping, or bloating in your belly (abdomen). You have trouble breathing or you are breathing very quickly. Your heart is beating very quickly. Your skin feels cold and clammy. You feel confused. You have signs of losing too much water in your body, such as: Dark pee, very little pee, or no pee. Cracked lips. Dry mouth. Sunken eyes. Sleepiness. Weakness. These symptoms may be an emergency. Get help right away. Call 911. Do not wait to see if the symptoms will go away. Do not drive yourself to the hospital. Summary Nausea is feeling like you are about vomit. If you vomit, or if you are not able to drink enough fluids, you may not have enough water in   your body (get dehydrated). Eat and drink what your doctor tells you. Take over-the-counter and prescription medicines only as told by your doctor. Contact a doctor right away if your symptoms get worse or you have new symptoms. Keep all follow-up visits. This information is not intended to replace advice given to you by your health care provider. Make sure you discuss any questions you have with your health care provider. Document Revised: 11/12/2020 Document Reviewed:  11/12/2020 Elsevier Patient Education  2024 [employer]vier Inc.  

## 2024-06-24 MED FILL — Fosaprepitant Dimeglumine For IV Infusion 150 MG (Base Eq): INTRAVENOUS | Qty: 5 | Status: AC

## 2024-06-25 ENCOUNTER — Encounter: Payer: Self-pay | Admitting: Rehabilitation

## 2024-06-25 ENCOUNTER — Inpatient Hospital Stay

## 2024-06-25 ENCOUNTER — Inpatient Hospital Stay: Attending: Hematology and Oncology | Admitting: Physician Assistant

## 2024-06-25 ENCOUNTER — Inpatient Hospital Stay: Attending: Hematology and Oncology

## 2024-06-25 ENCOUNTER — Inpatient Hospital Stay: Admitting: Licensed Clinical Social Worker

## 2024-06-25 ENCOUNTER — Ambulatory Visit: Attending: General Surgery | Admitting: Rehabilitation

## 2024-06-25 VITALS — BP 100/57 | HR 85 | Temp 97.3°F | Resp 20 | Wt 135.1 lb

## 2024-06-25 VITALS — BP 91/57 | HR 93 | Resp 20

## 2024-06-25 DIAGNOSIS — C50311 Malignant neoplasm of lower-inner quadrant of right female breast: Secondary | ICD-10-CM

## 2024-06-25 DIAGNOSIS — Z9189 Other specified personal risk factors, not elsewhere classified: Secondary | ICD-10-CM

## 2024-06-25 DIAGNOSIS — Z171 Estrogen receptor negative status [ER-]: Secondary | ICD-10-CM | POA: Diagnosis not present

## 2024-06-25 DIAGNOSIS — R293 Abnormal posture: Secondary | ICD-10-CM

## 2024-06-25 DIAGNOSIS — Z5111 Encounter for antineoplastic chemotherapy: Secondary | ICD-10-CM | POA: Diagnosis not present

## 2024-06-25 DIAGNOSIS — M25511 Pain in right shoulder: Secondary | ICD-10-CM

## 2024-06-25 DIAGNOSIS — M25611 Stiffness of right shoulder, not elsewhere classified: Secondary | ICD-10-CM

## 2024-06-25 LAB — CBC WITH DIFFERENTIAL (CANCER CENTER ONLY)
Abs Immature Granulocytes: 0.97 10*3/uL — ABNORMAL HIGH (ref 0.00–0.07)
Basophils Absolute: 0.1 10*3/uL (ref 0.0–0.1)
Basophils Relative: 1 %
Eosinophils Absolute: 0 10*3/uL (ref 0.0–0.5)
Eosinophils Relative: 0 %
HCT: 31.9 % — ABNORMAL LOW (ref 36.0–46.0)
Hemoglobin: 10.9 g/dL — ABNORMAL LOW (ref 12.0–15.0)
Immature Granulocytes: 9 %
Lymphocytes Relative: 15 %
Lymphs Abs: 1.7 10*3/uL (ref 0.7–4.0)
MCH: 27.9 pg (ref 26.0–34.0)
MCHC: 34.2 g/dL (ref 30.0–36.0)
MCV: 81.6 fL (ref 80.0–100.0)
Monocytes Absolute: 0.8 10*3/uL (ref 0.1–1.0)
Monocytes Relative: 7 %
Neutro Abs: 7.7 10*3/uL (ref 1.7–7.7)
Neutrophils Relative %: 68 %
Platelet Count: 322 10*3/uL (ref 150–400)
RBC: 3.91 MIL/uL (ref 3.87–5.11)
RDW: 14.6 % (ref 11.5–15.5)
WBC Count: 11.2 10*3/uL — ABNORMAL HIGH (ref 4.0–10.5)
nRBC: 0.5 % — ABNORMAL HIGH (ref 0.0–0.2)

## 2024-06-25 LAB — CMP (CANCER CENTER ONLY)
ALT: 17 U/L (ref 0–44)
AST: 16 U/L (ref 15–41)
Albumin: 4.5 g/dL (ref 3.5–5.0)
Alkaline Phosphatase: 147 U/L — ABNORMAL HIGH (ref 38–126)
Anion gap: 12 (ref 5–15)
BUN: 21 mg/dL (ref 8–23)
CO2: 26 mmol/L (ref 22–32)
Calcium: 9.5 mg/dL (ref 8.9–10.3)
Chloride: 100 mmol/L (ref 98–111)
Creatinine: 0.8 mg/dL (ref 0.44–1.00)
GFR, Estimated: >60 mL/min
Glucose, Bld: 200 mg/dL — ABNORMAL HIGH (ref 70–99)
Potassium: 3.8 mmol/L (ref 3.5–5.1)
Sodium: 137 mmol/L (ref 135–145)
Total Bilirubin: <0.2 mg/dL (ref 0.0–1.2)
Total Protein: 7.1 g/dL (ref 6.5–8.1)

## 2024-06-25 MED ORDER — DOXORUBICIN HCL CHEMO IV INJECTION 2 MG/ML
60.0000 mg/m2 | Freq: Once | INTRAVENOUS | Status: AC
  Administered 2024-06-25: 96 mg via INTRAVENOUS
  Filled 2024-06-25: qty 48

## 2024-06-25 MED ORDER — DEXAMETHASONE SOD PHOSPHATE PF 10 MG/ML IJ SOLN
10.0000 mg | Freq: Once | INTRAMUSCULAR | Status: AC
  Administered 2024-06-25: 10 mg via INTRAVENOUS
  Filled 2024-06-25: qty 1

## 2024-06-25 MED ORDER — PALONOSETRON HCL INJECTION 0.25 MG/5ML
0.2500 mg | Freq: Once | INTRAVENOUS | Status: AC
  Administered 2024-06-25: 0.25 mg via INTRAVENOUS
  Filled 2024-06-25: qty 5

## 2024-06-25 MED ORDER — SODIUM CHLORIDE 0.9 % IV SOLN
150.0000 mg | Freq: Once | INTRAVENOUS | Status: AC
  Administered 2024-06-25: 150 mg via INTRAVENOUS
  Filled 2024-06-25: qty 150

## 2024-06-25 MED ORDER — SODIUM CHLORIDE 0.9 % IV SOLN
600.0000 mg/m2 | Freq: Once | INTRAVENOUS | Status: AC
  Administered 2024-06-25: 1000 mg via INTRAVENOUS
  Filled 2024-06-25: qty 50

## 2024-06-25 MED ORDER — SODIUM CHLORIDE 0.9 % IV SOLN: INTRAVENOUS | Status: DC

## 2024-06-25 NOTE — Progress Notes (Signed)
 [facility] CSW Progress Note  Visual Merchandiser met with patient in infusion to follow-up on general support. Pt's son is with her in infusion.  Pt is having an issue with the pharmacy waiting for approval from insurance for her freestyle libre. CSW advised to call insurance and ask about coverage/approvals.  Patient reports having some ups and downs but that she has worked through them with the help of her supportive community.  No other needs today. CSW reminded of support services availability.   Follow Up Plan:  Patient will contact CSW with any support or resource needs    Chistine Dematteo E Leila Schuff, LCSW Clinical Social Worker [facility]    Patient is participating in a Managed Medicaid Plan:  Yes

## 2024-06-25 NOTE — Patient Instructions (Addendum)
 Dear Hannah [name],   Congratulations for your interest in quitting smoking!  Find a program that suits you best: when you want to quit, how you need support, where you live, and how you like to learn.    If youre ready to get started TODAY, consider scheduling a visit through [facility] [email or web address].  Appointments are available from 8am to 8pm, Monday to Friday.   Most health insurance plans will cover some level of tobacco cessation visits and medications.    Additional Resources: [name] are also available to help you quit & provide the support youll need. Many programs are available in both English and Spanish and have a long history of successfully helping people get off and stay off tobacco.    Quit Smoking Apps:  quitSTART at seriousbroker.de QuitGuide?at forgetparking.dk Online education and resources: Smokefree  at borders group.gov Free Telephone Coaching: QuitNow,  Call 1-800-QUIT-NOW (854 165 3211) or Text- Ready to 732-451-9709 *Quitline [place] has teamed up with Medicaid to offer a free 14 week program    Vaping- Want to Quit? Free 24/7 support. Call [facility]  [place], [place], [place], [place], [place]  [facility]

## 2024-06-25 NOTE — Patient Instructions (Signed)
 [facility] - A DEPT OF [facility]  Discharge Instructions: Thank you for choosing [facility] to provide your oncology and hematology care.   If you have a lab appointment with the Cancer Center, please go directly to the Cancer Center and check in at the registration area.   Wear comfortable clothing and clothing appropriate for easy access to any Portacath or PICC line.   We strive to give you quality time with your provider. You may need to reschedule your appointment if you arrive late (15 or more minutes).  Arriving late affects you and other patients whose appointments are after yours.  Also, if you miss three or more appointments without notifying the office, you may be dismissed from the clinic at the provider's discretion.      For prescription refill requests, have your pharmacy contact our office and allow 72 hours for refills to be completed.    Today you received the following chemotherapy and/or immunotherapy agents adriamycin, cytoxan      To help prevent nausea and vomiting after your treatment, we encourage you to take your nausea medication as directed.  BELOW ARE SYMPTOMS THAT SHOULD BE REPORTED IMMEDIATELY: *FEVER GREATER THAN 100.4 F (38 C) OR HIGHER *CHILLS OR SWEATING *NAUSEA AND VOMITING THAT IS NOT CONTROLLED WITH YOUR NAUSEA MEDICATION *UNUSUAL SHORTNESS OF BREATH *UNUSUAL BRUISING OR BLEEDING *URINARY PROBLEMS (pain or burning when urinating, or frequent urination) *BOWEL PROBLEMS (unusual diarrhea, constipation, pain near the anus) TENDERNESS IN MOUTH AND THROAT WITH OR WITHOUT PRESENCE OF ULCERS (sore throat, sores in mouth, or a toothache) UNUSUAL RASH, SWELLING OR PAIN  UNUSUAL VAGINAL DISCHARGE OR ITCHING   Items with * indicate a potential emergency and should be followed up as soon as possible or go to the Emergency Department if any problems should occur.  Please show the CHEMOTHERAPY ALERT CARD or  IMMUNOTHERAPY ALERT CARD at check-in to the Emergency Department and triage nurse.  Should you have questions after your visit or need to cancel or reschedule your appointment, please contact [facility] - [facility]  Dept: [phone]  and follow the prompts.  Office hours are 8:00 a.m. to 4:30 p.m. Monday - Friday. Please note that voicemails left after 4:00 p.m. may not be returned until the following business day.  We are closed weekends and major holidays. You have access to a nurse at all times for urgent questions. Please call the main number to the clinic Dept: [phone] and follow the prompts.   For any non-urgent questions, you may also contact your provider using MyChart. We now offer e-Visits for anyone 2 and older to request care online for non-urgent symptoms. For details visit [email or web address].   Also download the MyChart app! Go to the app store, search "MyChart", open the app, select [place], and log in with your MyChart username and password.

## 2024-06-26 ENCOUNTER — Inpatient Hospital Stay

## 2024-06-26 ENCOUNTER — Telehealth: Payer: Self-pay | Admitting: Hematology and Oncology

## 2024-06-26 ENCOUNTER — Other Ambulatory Visit: Payer: Self-pay

## 2024-06-26 ENCOUNTER — Encounter: Payer: Self-pay | Admitting: *Deleted

## 2024-06-26 NOTE — Telephone Encounter (Signed)
 Tried to call pt to inform of scheduled IV fluids

## 2024-06-27 ENCOUNTER — Inpatient Hospital Stay

## 2024-06-27 VITALS — BP 108/50 | HR 74 | Temp 98.1°F | Resp 18

## 2024-06-27 DIAGNOSIS — C50311 Malignant neoplasm of lower-inner quadrant of right female breast: Secondary | ICD-10-CM

## 2024-06-27 MED ORDER — PEGFILGRASTIM-CBQV 6 MG/0.6ML ~~LOC~~ SOSY
6.0000 mg | PREFILLED_SYRINGE | Freq: Once | SUBCUTANEOUS | Status: AC
  Administered 2024-06-27: 6 mg via SUBCUTANEOUS
  Filled 2024-06-27: qty 0.6

## 2024-07-01 ENCOUNTER — Ambulatory Visit

## 2024-07-03 ENCOUNTER — Ambulatory Visit

## 2024-07-05 ENCOUNTER — Inpatient Hospital Stay

## 2024-07-07 ENCOUNTER — Ambulatory Visit: Admitting: Rehabilitation

## 2024-07-09 ENCOUNTER — Ambulatory Visit: Admitting: Rehabilitation

## 2024-07-10 ENCOUNTER — Inpatient Hospital Stay: Admitting: Hematology and Oncology

## 2024-07-10 ENCOUNTER — Inpatient Hospital Stay

## 2024-07-12 ENCOUNTER — Inpatient Hospital Stay

## 2024-07-14 ENCOUNTER — Ambulatory Visit: Admitting: Rehabilitation

## 2024-07-16 ENCOUNTER — Ambulatory Visit: Admitting: Rehabilitation
